# Patient Record
Sex: Female | Born: 1985 | Race: White | Hispanic: No | Marital: Single | Smoking: Never smoker
Health system: Southern US, Community
[De-identification: ages and names within clinical notes are randomized; demographics above are authoritative.]

## PROBLEM LIST (undated history)

## (undated) DIAGNOSIS — N390 Urinary tract infection, site not specified: Secondary | ICD-10-CM

## (undated) DIAGNOSIS — K635 Polyp of colon: Secondary | ICD-10-CM

## (undated) DIAGNOSIS — R112 Nausea with vomiting, unspecified: Secondary | ICD-10-CM

## (undated) DIAGNOSIS — R87619 Unspecified abnormal cytological findings in specimens from cervix uteri: Secondary | ICD-10-CM

## (undated) DIAGNOSIS — R519 Headache, unspecified: Secondary | ICD-10-CM

## (undated) DIAGNOSIS — R350 Frequency of micturition: Secondary | ICD-10-CM

## (undated) DIAGNOSIS — F17200 Nicotine dependence, unspecified, uncomplicated: Secondary | ICD-10-CM

## (undated) DIAGNOSIS — J302 Other seasonal allergic rhinitis: Secondary | ICD-10-CM

## (undated) DIAGNOSIS — IMO0002 Reserved for concepts with insufficient information to code with codable children: Secondary | ICD-10-CM

## (undated) DIAGNOSIS — N898 Other specified noninflammatory disorders of vagina: Principal | ICD-10-CM

## (undated) DIAGNOSIS — Z9889 Other specified postprocedural states: Secondary | ICD-10-CM

## (undated) DIAGNOSIS — F419 Anxiety disorder, unspecified: Secondary | ICD-10-CM

## (undated) DIAGNOSIS — R51 Headache: Secondary | ICD-10-CM

## (undated) HISTORY — DX: Urinary tract infection, site not specified: N39.0

## (undated) HISTORY — DX: Reserved for concepts with insufficient information to code with codable children: IMO0002

## (undated) HISTORY — DX: Headache: R51

## (undated) HISTORY — DX: Other specified noninflammatory disorders of vagina: N89.8

## (undated) HISTORY — PX: OTHER SURGICAL HISTORY: SHX169

## (undated) HISTORY — DX: Unspecified abnormal cytological findings in specimens from cervix uteri: R87.619

## (undated) HISTORY — DX: Polyp of colon: K63.5

## (undated) HISTORY — DX: Nicotine dependence, unspecified, uncomplicated: F17.200

## (undated) HISTORY — DX: Headache, unspecified: R51.9

## (undated) HISTORY — DX: Frequency of micturition: R35.0

---

## 2008-10-06 ENCOUNTER — Other Ambulatory Visit: Admission: RE | Admit: 2008-10-06 | Discharge: 2008-10-06 | Payer: Self-pay | Admitting: Obstetrics & Gynecology

## 2008-10-07 ENCOUNTER — Emergency Department: Payer: Self-pay | Admitting: Emergency Medicine

## 2009-10-10 ENCOUNTER — Other Ambulatory Visit: Admission: RE | Admit: 2009-10-10 | Discharge: 2009-10-10 | Payer: Self-pay | Admitting: Obstetrics & Gynecology

## 2010-06-12 ENCOUNTER — Other Ambulatory Visit: Admission: RE | Admit: 2010-06-12 | Discharge: 2010-06-12 | Payer: Self-pay | Admitting: Obstetrics & Gynecology

## 2010-11-10 ENCOUNTER — Other Ambulatory Visit
Admission: RE | Admit: 2010-11-10 | Discharge: 2010-11-10 | Payer: Self-pay | Source: Home / Self Care | Admitting: Obstetrics & Gynecology

## 2011-10-21 ENCOUNTER — Other Ambulatory Visit: Payer: Self-pay | Admitting: Obstetrics & Gynecology

## 2012-01-01 ENCOUNTER — Other Ambulatory Visit (HOSPITAL_COMMUNITY)
Admission: RE | Admit: 2012-01-01 | Discharge: 2012-01-01 | Disposition: A | Discharge status: Home / Self Care | Payer: Medicaid Other | Source: Ambulatory Visit | Attending: Obstetrics & Gynecology | Admitting: Obstetrics & Gynecology

## 2012-01-01 DIAGNOSIS — Z01419 Encounter for gynecological examination (general) (routine) without abnormal findings: Secondary | ICD-10-CM | POA: Insufficient documentation

## 2012-05-19 DIAGNOSIS — Z882 Allergy status to sulfonamides status: Secondary | ICD-10-CM | POA: Insufficient documentation

## 2012-05-19 DIAGNOSIS — Z886 Allergy status to analgesic agent status: Secondary | ICD-10-CM | POA: Insufficient documentation

## 2012-05-19 DIAGNOSIS — F411 Generalized anxiety disorder: Secondary | ICD-10-CM | POA: Insufficient documentation

## 2012-05-20 ENCOUNTER — Encounter (HOSPITAL_COMMUNITY): Payer: Self-pay

## 2012-05-20 ENCOUNTER — Emergency Department (HOSPITAL_COMMUNITY)
Admission: EM | Admit: 2012-05-20 | Discharge: 2012-05-20 | Disposition: A | Discharge status: Home / Self Care | Payer: Medicaid Other | Attending: Emergency Medicine | Admitting: Emergency Medicine

## 2012-05-20 DIAGNOSIS — F419 Anxiety disorder, unspecified: Secondary | ICD-10-CM

## 2012-05-20 HISTORY — DX: Other seasonal allergic rhinitis: J30.2

## 2012-05-20 HISTORY — DX: Anxiety disorder, unspecified: F41.9

## 2012-05-20 MED ORDER — ALPRAZOLAM 0.5 MG PO TABS
0.5000 mg | ORAL_TABLET | Freq: Once | ORAL | Status: AC
  Administered 2012-05-20: 0.5 mg via ORAL
  Filled 2012-05-20: qty 1

## 2012-05-20 NOTE — ED Notes (Signed)
I was on nerve medications and I didn't know that I didn't have any medication left. My doctor is out of town per pt. My nerves are tearing my stomach up.

## 2012-05-20 NOTE — ED Provider Notes (Signed)
History     CSN: [id number]  Arrival date & time 05/19/12  2356   First MD Initiated Contact with Patient 05/20/12 0020      Chief Complaint  Patient presents with  . Anxiety    (Consider location/radiation/quality/duration/timing/severity/associated sxs/prior treatment) HPI Julia Stanley is a 26 y.o. female who presents to the Emergency Department complaining of anxiety due to life circumstances. She has been on a "nerve" pill which she has run out of. She does not have the bottle and it is not listed in her current medicines. She states her doctor is on vacation for a week and unavailable for refills.   [facility]  Past Medical History  Diagnosis Date  . Anxiety   . Seasonal allergies     History reviewed. No pertinent past surgical history.  History reviewed. No pertinent family history.  History  Substance Use Topics  . Smoking status: Never Smoker   . Smokeless tobacco: Not on file  . Alcohol Use: No    OB History    Grav Para Term Preterm Abortions TAB SAB Ect Mult Living                  Review of Systems  Constitutional: Negative for fever.       10 Systems reviewed and are negative for acute change except as noted in the HPI.  HENT: Negative for congestion.   Eyes: Negative for discharge and redness.  Respiratory: Negative for cough and shortness of breath.   Cardiovascular: Negative for chest pain.  Gastrointestinal: Negative for vomiting and abdominal pain.  Musculoskeletal: Negative for back pain.  Skin: Negative for rash.  Neurological: Negative for syncope, numbness and headaches.  Psychiatric/Behavioral:       Anxious    Allergies  Aspirin and Sulfa antibiotics  Home Medications   Current Outpatient Rx  Name Route Sig Dispense Refill  . LORATADINE 10 MG PO TABS Oral Take 10 mg by mouth daily.      BP 120/68  Pulse 104  Temp 98.3 F (36.8 C) (Oral)  Resp 18  Ht 5\' 5"  (1.651 m)  Wt 120 lb (54.432 kg)  BMI 19.97  kg/m2  SpO2 98%  LMP 05/14/2012  Physical Exam  Nursing note and vitals reviewed. Constitutional:       Awake, alert, nontoxic appearance.  HENT:  Head: Atraumatic.  Eyes: Right eye exhibits no discharge. Left eye exhibits no discharge.  Neck: Neck supple.  Pulmonary/Chest: Effort normal. She exhibits no tenderness.  Abdominal: Soft. There is no tenderness. There is no rebound.  Musculoskeletal: She exhibits no tenderness.       Baseline ROM, no obvious new focal weakness.  Neurological:       Mental status and motor strength appears baseline for patient and situation.  Skin: No rash noted.  Psychiatric:       Anxious. She denies SI, HI. She is not psychotic.    ED Course  Procedures (including critical care time)     MDM  Patient with a h/o anxiety here with increased anxiety, out of medications. She did not bring her medication bottles. Given xanax while here. She is to follow up with her own doctor.Pt stable in ED with no significant deterioration in condition.The patient appears reasonably screened and/or stabilized for discharge and I doubt any other medical condition or other [facility] requiring further screening, evaluation, or treatment in the ED at this time prior to discharge.  MDM Reviewed: nursing note, vitals and previous  chart           Nicoletta Dress. Colon Branch, MD 05/20/12 [phone]

## 2013-01-19 ENCOUNTER — Other Ambulatory Visit: Payer: Self-pay | Admitting: Obstetrics and Gynecology

## 2013-01-23 ENCOUNTER — Encounter: Payer: Self-pay | Admitting: *Deleted

## 2013-01-23 DIAGNOSIS — R87619 Unspecified abnormal cytological findings in specimens from cervix uteri: Secondary | ICD-10-CM | POA: Insufficient documentation

## 2013-01-23 DIAGNOSIS — IMO0002 Reserved for concepts with insufficient information to code with codable children: Secondary | ICD-10-CM | POA: Insufficient documentation

## 2013-01-26 ENCOUNTER — Encounter: Payer: Self-pay | Admitting: Obstetrics & Gynecology

## 2013-01-26 ENCOUNTER — Ambulatory Visit (INDEPENDENT_AMBULATORY_CARE_PROVIDER_SITE_OTHER): Payer: Medicaid Other | Admitting: Obstetrics & Gynecology

## 2013-01-26 VITALS — BP 90/70 | Ht 65.0 in | Wt 140.0 lb

## 2013-01-26 DIAGNOSIS — Z01419 Encounter for gynecological examination (general) (routine) without abnormal findings: Secondary | ICD-10-CM

## 2013-01-26 DIAGNOSIS — Z0000 Encounter for general adult medical examination without abnormal findings: Secondary | ICD-10-CM

## 2013-01-26 MED ORDER — DESOGESTREL-ETHINYL ESTRADIOL 0.15-0.02/0.01 MG (21/5) PO TABS: 1.0000 | ORAL_TABLET | Freq: Every day | ORAL | Status: DC

## 2013-01-26 NOTE — Patient Instructions (Signed)

## 2013-01-26 NOTE — Progress Notes (Signed)
Patient ID: Julia Stanley, female   DOB: 10/15/1986, 27 y.o.   MRN: [id number] Subjective:     Julia Stanley is a 27 y.o. female here for a routine exam.  Current complaints: irregular bleeding on pills.  Personal health questionnaire reviewed: yes.   Gynecologic History Patient's last menstrual period was 01/13/2013. Contraception: OCP (estrogen/progesterone) Last Pap: 2013. Results were: normal Last mammogram: na. Results were: ns  Obstetric History  G0 OB History   Grav Para Term Preterm Abortions TAB SAB Ect Mult Living                   The following portions of the patient's history were reviewed and updated as appropriate: allergies, current medications, past family history, past medical history, past social history, past surgical history and problem list.  Review of Systems  Review of Systems  Constitutional: Negative for fever, chills, weight loss, malaise/fatigue and diaphoresis.  HENT: Negative for hearing loss, ear pain, nosebleeds, congestion, sore throat, neck pain, tinnitus and ear discharge.   Eyes: Negative for blurred vision, double vision, photophobia, pain, discharge and redness.  Respiratory: Negative for cough, hemoptysis, sputum production, shortness of breath, wheezing and stridor.   Cardiovascular: Negative for chest pain, palpitations, orthopnea, claudication, leg swelling and PND.  Gastrointestinal: negative for abdominal pain. Negative for heartburn, nausea, vomiting, diarrhea, constipation, blood in stool and melena.  Genitourinary: Negative for dysuria, urgency, frequency, hematuria and flank pain.  Musculoskeletal: Negative for myalgias, back pain, joint pain and falls.  Skin: Negative for itching and rash.  Neurological: Negative for dizziness, tingling, tremors, sensory change, speech change, focal weakness, seizures, loss of consciousness, weakness and headaches.  Endo/Heme/Allergies: Negative for environmental allergies and polydipsia. Does not  bruise/bleed easily.  Psychiatric/Behavioral: Negative for depression, suicidal ideas, hallucinations, memory loss and substance abuse. The patient is not nervous/anxious and does not have insomnia.        Objective:    Physical Exam  Vitals reviewed. Constitutional: She is oriented to person, place, and time. She appears well-developed and well-nourished.  HENT:  Head: Normocephalic and atraumatic.  Right Ear: External ear normal.  Left Ear: External ear normal.  Nose: Nose normal.  Mouth/Throat: Oropharynx is clear and moist.  Eyes: Conjunctivae and EOM are normal. Pupils are equal, round, and reactive to light. Right eye exhibits no discharge. Left eye exhibits no discharge. No scleral icterus.  Neck: Normal range of motion. Neck supple. No tracheal deviation present. No thyromegaly present.  Cardiovascular: Normal rate, regular rhythm, normal heart sounds and intact distal pulses.  Exam reveals no gallop and no friction rub.   No murmur heard. Respiratory: Effort normal and breath sounds normal. No respiratory distress. She has no wheezes. She has no rales. She exhibits no tenderness.  GI: Soft. Bowel sounds are normal. She exhibits no distension and no mass. There is no tenderness. There is no rebound and no guarding.  Genitourinary:       Vulva is normal without lesions Vagina is pink moist without discharge Cervix normal in appearance and pap is performed Uterus is normal size shape and contour Adnexa is negative with normal sized ovaries by sonogram  Musculoskeletal: Normal range of motion. She exhibits no edema and no tenderness.  Neurological: She is alert and oriented to person, place, and time. She has normal reflexes. She displays normal reflexes. No cranial nerve deficit. She exhibits normal muscle tone. Coordination normal.  Skin: Skin is warm and dry. No rash noted. No erythema. No pallor.  Psychiatric:  She has a normal mood and affect. Her behavior is normal. Judgment  and thought content normal.       Assessment:    Healthy female exam.    Plan:    Contraception: OCP (estrogen/progesterone).

## 2013-01-28 ENCOUNTER — Other Ambulatory Visit: Payer: Self-pay | Admitting: *Deleted

## 2013-01-28 ENCOUNTER — Encounter: Payer: Self-pay | Admitting: Obstetrics & Gynecology

## 2013-01-29 NOTE — Telephone Encounter (Signed)
Pt requesting Pap results, informed in mychart, Pap normal

## 2013-03-09 ENCOUNTER — Telehealth: Payer: Self-pay | Admitting: Obstetrics & Gynecology

## 2013-03-10 ENCOUNTER — Telehealth: Payer: Self-pay

## 2013-03-10 MED ORDER — FLUCONAZOLE 150 MG PO TABS: 150.0000 mg | ORAL_TABLET | Freq: Once | ORAL | Status: DC

## 2013-03-10 NOTE — Telephone Encounter (Signed)
DIFLUCAN 150 MG PRESCRIBE BY DR Despina Hidden.

## 2013-03-10 NOTE — Telephone Encounter (Signed)
LEFT MESSAGE TO CALL OFFICE

## 2013-03-10 NOTE — Addendum Note (Signed)
Addended by: Lazaro Arms on: 03/10/2013 03:40 PM   Modules accepted: Orders

## 2013-03-10 NOTE — Telephone Encounter (Signed)
HAVING VAGINAL ITCHING X 3 DAYS .USEING MICONAZOLE. NO BETTER.

## 2013-03-17 NOTE — Telephone Encounter (Signed)
PEGGY CALLED A RX IN

## 2013-06-02 ENCOUNTER — Telehealth: Payer: Self-pay | Admitting: Obstetrics & Gynecology

## 2013-06-02 NOTE — Telephone Encounter (Signed)
Pt states started her period on last Tuesday, bleeding real heavy and large clots. Going through 3-4 pads q 2 hours. Call transferred to front staff for soonest appt with Dr. Despina Hidden.

## 2013-06-05 ENCOUNTER — Telehealth: Payer: Self-pay | Admitting: *Deleted

## 2013-06-05 NOTE — Telephone Encounter (Signed)
Spoke with pt. Stopped period yesterday. Now having brown discharge and cramping, like she's going to start again. On Kariva birth control since 12/2012. Advised to give it over the weekend and call Monday if she does starts. Pt voiced understanding. JSY

## 2013-06-08 ENCOUNTER — Ambulatory Visit: Payer: Medicaid Other | Admitting: Obstetrics & Gynecology

## 2013-06-10 ENCOUNTER — Telehealth: Payer: Self-pay | Admitting: Obstetrics & Gynecology

## 2013-06-10 NOTE — Telephone Encounter (Signed)
Pt states continues to have brown discharge and cramping. Pt taking Kariva to regulate periods. Pt spoke with nurse last week and was told to call office back this week if no improvement. Informed pt may need to make an appt, pt states had transportation issues.

## 2013-06-11 MED ORDER — NORETHINDRONE-ETH ESTRADIOL 0.4-35 MG-MCG PO TABS: 1.0000 | ORAL_TABLET | Freq: Every day | ORAL | Status: DC

## 2013-06-16 NOTE — Telephone Encounter (Signed)
Pt notified of change birth control from [place] to [place]. Stop current BCP and should have period then start Ovcon 35. Pt verbalized understanding.

## 2013-08-09 ENCOUNTER — Encounter (HOSPITAL_COMMUNITY): Payer: Self-pay | Admitting: Emergency Medicine

## 2013-08-09 ENCOUNTER — Emergency Department (HOSPITAL_COMMUNITY): Payer: Medicaid Other

## 2013-08-09 ENCOUNTER — Emergency Department (HOSPITAL_COMMUNITY)
Admission: EM | Admit: 2013-08-09 | Discharge: 2013-08-09 | Disposition: A | Discharge status: Home / Self Care | Payer: Medicaid Other | Attending: Emergency Medicine | Admitting: Emergency Medicine

## 2013-08-09 DIAGNOSIS — S93409A Sprain of unspecified ligament of unspecified ankle, initial encounter: Secondary | ICD-10-CM | POA: Insufficient documentation

## 2013-08-09 DIAGNOSIS — Y9289 Other specified places as the place of occurrence of the external cause: Secondary | ICD-10-CM | POA: Insufficient documentation

## 2013-08-09 DIAGNOSIS — Z880 Allergy status to penicillin: Secondary | ICD-10-CM | POA: Insufficient documentation

## 2013-08-09 DIAGNOSIS — Y9302 Activity, running: Secondary | ICD-10-CM | POA: Insufficient documentation

## 2013-08-09 DIAGNOSIS — F411 Generalized anxiety disorder: Secondary | ICD-10-CM | POA: Insufficient documentation

## 2013-08-09 DIAGNOSIS — Z8709 Personal history of other diseases of the respiratory system: Secondary | ICD-10-CM | POA: Insufficient documentation

## 2013-08-09 DIAGNOSIS — Z79899 Other long term (current) drug therapy: Secondary | ICD-10-CM | POA: Insufficient documentation

## 2013-08-09 DIAGNOSIS — W010XXA Fall on same level from slipping, tripping and stumbling without subsequent striking against object, initial encounter: Secondary | ICD-10-CM | POA: Insufficient documentation

## 2013-08-09 MED ORDER — HYDROCODONE-ACETAMINOPHEN 5-325 MG PO TABS
1.0000 | ORAL_TABLET | Freq: Once | ORAL | Status: AC
  Administered 2013-08-09: 1 via ORAL
  Filled 2013-08-09: qty 1

## 2013-08-09 MED ORDER — TRAMADOL HCL 50 MG PO TABS: 50.0000 mg | ORAL_TABLET | Freq: Four times a day (QID) | ORAL | Status: DC | PRN

## 2013-08-09 MED ORDER — HYDROCODONE-ACETAMINOPHEN 5-325 MG PO TABS: 1.0000 | ORAL_TABLET | ORAL | Status: DC | PRN

## 2013-08-09 NOTE — ED Notes (Signed)
Pt reports ran and tripped over a tree stump.  Swelling noted to left ankle.

## 2013-08-09 NOTE — ED Provider Notes (Signed)
CSN: [id number]     Arrival date & time 08/09/13  1645 History   First MD Initiated Contact with Patient 08/09/13 1700     Chief Complaint  Patient presents with  . Ankle Pain   (Consider location/radiation/quality/duration/timing/severity/associated sxs/prior Treatment) HPI Comments: Julia Stanley is a 27 y.o. Female presenting with left ankle pain which occurred attempting to jump over a tree stump yesterday,  Instead tripping and inverting her left ankle.  She has pain which is aching, constant and worse with palpation, movement and weight bearing.  The patient was able to weight bear immediately after the event.  There is radiation of pain into her left upper lateral leg and has developed swelling mid left leg as well.  The patients treatment prior to arrival included tylenol without relief of symptoms.   The history is provided by the patient.    Past Medical History  Diagnosis Date  . Anxiety   . Seasonal allergies   . Abnormal Pap smear    History reviewed. No pertinent past surgical history. No family history on file. History  Substance Use Topics  . Smoking status: Never Smoker   . Smokeless tobacco: Not on file  . Alcohol Use: No   OB History   Grav Para Term Preterm Abortions TAB SAB Ect Mult Living   0              Review of Systems  Musculoskeletal: Positive for arthralgias and joint swelling.  Skin: Positive for color change. Negative for wound.  Neurological: Negative for weakness and numbness.    Allergies  Aspirin; Penicillins; and Sulfa antibiotics  Home Medications   Current Outpatient Rx  Name  Route  Sig  Dispense  Refill  . desogestrel-ethinyl estradiol (KARIVA) 0.15-0.02/0.01 MG (21/5) tablet   Oral   Take 1 tablet by mouth daily.   1 Package   11   . fluconazole (DIFLUCAN) 150 MG tablet   Oral   Take 1 tablet (150 mg total) by mouth once. Take the second tablet 3 days after the first one.   2 tablet   0   . loratadine (CLARITIN) 10  MG tablet   Oral   Take 10 mg by mouth daily.         . norethindrone-ethinyl estradiol (OVCON-35,BALZIVA,BRIELLYN) 0.4-35 MG-MCG tablet   Oral   Take 1 tablet by mouth daily.   1 Package   11   . traMADol (ULTRAM) 50 MG tablet   Oral   Take 1 tablet (50 mg total) by mouth every 6 (six) hours as needed for pain.   20 tablet   0    BP 114/70  Pulse 107  Temp(Src) 97.7 F (36.5 C)  Resp 18  Ht 5\' 6"  (1.676 m)  Wt 163 lb 1 oz (73.965 kg)  BMI 26.33 kg/m2  SpO2 100%  LMP 07/20/2013 Physical Exam  Nursing note and vitals reviewed. Constitutional: She appears well-developed and well-nourished.  HENT:  Head: Normocephalic.  Cardiovascular: Normal rate and intact distal pulses.  Exam reveals no decreased pulses.   Pulses:      Dorsalis pedis pulses are 2+ on the right side, and 2+ on the left side.       Posterior tibial pulses are 2+ on the right side, and 2+ on the left side.  Musculoskeletal: She exhibits edema and tenderness.       Left ankle: She exhibits decreased range of motion and swelling. She exhibits no ecchymosis, no deformity and normal  pulse. Tenderness. Lateral malleolus and proximal fibula tenderness found. No head of 5th metatarsal tenderness found. Achilles tendon normal. Achilles tendon exhibits normal Thompson's test results.       Left lower leg: She exhibits tenderness and swelling.       Legs: Neurological: She is alert. No sensory deficit.  Skin: Skin is warm, dry and intact.    ED Course  Procedures (including critical care time) Labs Review Labs Reviewed - No data to display Imaging Review Dg Tibia/fibula Left  08/09/2013   CLINICAL DATA:  But ankle pain. Fall.  EXAM: LEFT TIBIA AND FIBULA - 2 VIEW  COMPARISON:  None.  FINDINGS: There is no evidence of fracture or other focal bone lesions. Soft tissues are unremarkable.  IMPRESSION: Negative.   Electronically Signed   By: Charlett Nose M.D.   On: 08/09/2013 17:53   Dg Ankle Complete  Left  08/09/2013   CLINICAL DATA:  Injury  EXAM: LEFT ANKLE COMPLETE - 3+ VIEW  COMPARISON:  07/29/2013  FINDINGS: No acute fracture. No dislocation.  IMPRESSION: No acute bony pathology.   Electronically Signed   By: Maryclare Bean M.D.   On: 08/09/2013 17:30    EKG Interpretation   None       MDM   1. Ankle sprain and strain, left, initial encounter    Patients labs and/or radiological studies were viewed and considered during the medical decision making and disposition process. RICE,  [place], crutches (pat has).  Recheck with pcp if not improved over the next 10 days.    Burgess Amor, PA-C 08/09/13 [id number]

## 2013-08-09 NOTE — ED Provider Notes (Signed)
  Medical screening examination/treatment/procedure(s) were performed by non-physician practitioner and as supervising physician I was immediately available for consultation/collaboration.    Gerhard Munch, MD 08/09/13 [phone]

## 2013-08-27 ENCOUNTER — Other Ambulatory Visit: Payer: Self-pay

## 2013-09-09 ENCOUNTER — Telehealth: Payer: Self-pay | Admitting: Adult Health

## 2013-09-09 ENCOUNTER — Other Ambulatory Visit: Payer: Self-pay | Admitting: Obstetrics & Gynecology

## 2013-09-09 NOTE — Telephone Encounter (Signed)
Pt c/o vaginal itching. Offered to transfer call to front staff for an appt. Pt states would call office back when she knew her schedule.

## 2013-10-06 ENCOUNTER — Encounter: Payer: Self-pay | Admitting: Obstetrics & Gynecology

## 2013-10-07 ENCOUNTER — Telehealth: Payer: Self-pay | Admitting: *Deleted

## 2013-10-07 MED ORDER — MEDROXYPROGESTERONE ACETATE 150 MG/ML IM SUSP: 150.0000 mg | INTRAMUSCULAR | Status: DC

## 2013-10-07 NOTE — Telephone Encounter (Signed)
Spoke with pt. She is on birth control pills but sometimes forgets them. Is interested in starting the Depo shot. Call transferred to front desk to schedule an appt to discuss Depo. JSY

## 2013-10-09 ENCOUNTER — Encounter: Payer: Self-pay | Admitting: Obstetrics & Gynecology

## 2013-10-10 ENCOUNTER — Encounter: Payer: Self-pay | Admitting: Obstetrics & Gynecology

## 2013-10-12 ENCOUNTER — Telehealth: Payer: Self-pay | Admitting: Obstetrics & Gynecology

## 2013-10-12 ENCOUNTER — Ambulatory Visit: Payer: Medicaid Other | Admitting: Obstetrics & Gynecology

## 2013-10-12 ENCOUNTER — Encounter: Payer: Self-pay | Admitting: Obstetrics & Gynecology

## 2013-10-12 NOTE — Telephone Encounter (Signed)
Pt states to get depo provera 150 mg on Wednesday  Has not started her periods as of yet. Per Dr. Emelda Fear, pt to come in on Tuesday for Qhcg, continue ot take OCP. Pt states will call back for an lab appt tomorrow if not started period tonight.

## 2013-10-13 ENCOUNTER — Telehealth: Payer: Self-pay | Admitting: Obstetrics & Gynecology

## 2013-10-13 ENCOUNTER — Encounter: Payer: Self-pay | Admitting: Obstetrics & Gynecology

## 2013-10-13 NOTE — Telephone Encounter (Signed)
Spoke with pt. Started period this am. Is on birth control pills. Pt has appt to get Depo shot tomorrow. Pt was wondering if she should take pill tomorrow am. I advised it would be fine to go ahead and take pill. Advised would need to use backup for 5 days after starting Depo, per Dr. Despina Hidden. Pt voiced understanding. JSY

## 2013-10-14 ENCOUNTER — Encounter: Payer: Self-pay | Admitting: Obstetrics & Gynecology

## 2013-10-14 ENCOUNTER — Encounter: Payer: Self-pay | Admitting: Obstetrics and Gynecology

## 2013-10-14 ENCOUNTER — Ambulatory Visit (INDEPENDENT_AMBULATORY_CARE_PROVIDER_SITE_OTHER): Payer: Medicaid Other | Admitting: Obstetrics and Gynecology

## 2013-10-14 VITALS — BP 120/78 | Ht 66.0 in | Wt 157.6 lb

## 2013-10-14 DIAGNOSIS — Z309 Encounter for contraceptive management, unspecified: Secondary | ICD-10-CM

## 2013-10-14 DIAGNOSIS — Z3202 Encounter for pregnancy test, result negative: Secondary | ICD-10-CM

## 2013-10-14 DIAGNOSIS — Z3049 Encounter for surveillance of other contraceptives: Secondary | ICD-10-CM

## 2013-10-14 MED ORDER — MEDROXYPROGESTERONE ACETATE 150 MG/ML IM SUSP
150.0000 mg | Freq: Once | INTRAMUSCULAR | Status: AC
  Administered 2013-10-14: 150 mg via INTRAMUSCULAR

## 2013-10-14 NOTE — Progress Notes (Signed)
Patient ID: Julia Stanley, female   DOB: [birth year], 27 y.o.   MRN: [id number] Pt here today for Depo Provera injection, given in rt deltoid.  Pt to return between 12/30/13 and 01/13/14 for next injection.

## 2013-10-18 ENCOUNTER — Encounter: Payer: Self-pay | Admitting: Obstetrics & Gynecology

## 2013-10-19 ENCOUNTER — Other Ambulatory Visit: Payer: Self-pay | Admitting: *Deleted

## 2013-10-19 ENCOUNTER — Telehealth: Payer: Self-pay | Admitting: Obstetrics & Gynecology

## 2013-10-19 ENCOUNTER — Encounter: Payer: Self-pay | Admitting: Obstetrics & Gynecology

## 2013-10-19 LAB — POCT URINE PREGNANCY: Preg Test, Ur: NEGATIVE

## 2013-10-19 MED ORDER — METRONIDAZOLE 0.75 % VA GEL: VAGINAL | Status: DC

## 2013-10-19 NOTE — Addendum Note (Signed)
Addended by: Gaylyn Rong A on: 10/19/2013 11:47 AM   Modules accepted: Orders

## 2013-10-19 NOTE — Telephone Encounter (Signed)
metrogel for BV

## 2013-10-19 NOTE — Telephone Encounter (Signed)
Pt states that her medicaid will not cover the cream, can you call in the pill instead.

## 2013-10-20 ENCOUNTER — Encounter: Payer: Self-pay | Admitting: Obstetrics & Gynecology

## 2013-10-20 MED ORDER — METRONIDAZOLE 500 MG PO TABS: 500.0000 mg | ORAL_TABLET | Freq: Two times a day (BID) | ORAL | Status: DC

## 2013-10-23 ENCOUNTER — Encounter: Payer: Self-pay | Admitting: Obstetrics & Gynecology

## 2013-10-23 ENCOUNTER — Other Ambulatory Visit: Payer: Self-pay | Admitting: Adult Health

## 2013-10-23 MED ORDER — PROMETHAZINE HCL 25 MG PO TABS: ORAL_TABLET | ORAL | Status: DC

## 2013-10-26 ENCOUNTER — Encounter: Payer: Self-pay | Admitting: Obstetrics & Gynecology

## 2013-10-27 ENCOUNTER — Ambulatory Visit (INDEPENDENT_AMBULATORY_CARE_PROVIDER_SITE_OTHER): Payer: Medicaid Other | Admitting: Adult Health

## 2013-10-27 ENCOUNTER — Encounter: Payer: Self-pay | Admitting: Adult Health

## 2013-10-27 ENCOUNTER — Telehealth: Payer: Self-pay | Admitting: *Deleted

## 2013-10-27 ENCOUNTER — Encounter: Payer: Self-pay | Admitting: Obstetrics & Gynecology

## 2013-10-27 VITALS — BP 112/66 | Ht 65.0 in | Wt 156.0 lb

## 2013-10-27 DIAGNOSIS — F17200 Nicotine dependence, unspecified, uncomplicated: Secondary | ICD-10-CM

## 2013-10-27 DIAGNOSIS — Z3009 Encounter for other general counseling and advice on contraception: Secondary | ICD-10-CM

## 2013-10-27 DIAGNOSIS — Z3049 Encounter for surveillance of other contraceptives: Secondary | ICD-10-CM

## 2013-10-27 DIAGNOSIS — L270 Generalized skin eruption due to drugs and medicaments taken internally: Secondary | ICD-10-CM

## 2013-10-27 HISTORY — DX: Nicotine dependence, unspecified, uncomplicated: F17.200

## 2013-10-27 NOTE — Telephone Encounter (Signed)
Pt seen in office today by JAG

## 2013-10-27 NOTE — Progress Notes (Signed)
Subjective:     Patient ID: Julia Stanley, female   DOB: Feb 02, 1986, 28 y.o.   MRN: [id number]  HPI Julia Stanley is a 28 year old white female single in wanting to discuss depo vs the pill and has rash after taking phenergan.Has had some hot flashes and brown discharge with depo.  Review of Systems See HPI Reviewed past medical,surgical, social and family history. Reviewed medications and allergies.     Objective:   Physical Exam BP 112/66  Ht 5\' 5"  (1.651 m)  Wt 156 lb (70.761 kg)  BMI 25.96 kg/m2  LMP 10/13/2013   discussed has only been on depo since 12/24 to give it a chance and not to take phenergan again, she dips and I encouraged her to try to quit Has what looks like drug rash, use benadryl  Assessment:     Drug rash Contraceptive counseling nicotine addiction    Plan:     Try to decrease dipping frequency and amount so quitting will be easier Stay on depo Use condoms Return 3/20 as scheduled for depo

## 2013-10-27 NOTE — Patient Instructions (Signed)

## 2013-10-28 ENCOUNTER — Encounter: Payer: Self-pay | Admitting: Obstetrics & Gynecology

## 2013-11-02 ENCOUNTER — Encounter: Payer: Self-pay | Admitting: Obstetrics & Gynecology

## 2013-11-04 ENCOUNTER — Encounter: Payer: Self-pay | Admitting: Obstetrics & Gynecology

## 2013-11-14 ENCOUNTER — Encounter: Payer: Self-pay | Admitting: Obstetrics & Gynecology

## 2013-11-18 ENCOUNTER — Encounter: Payer: Self-pay | Admitting: Obstetrics & Gynecology

## 2013-11-18 ENCOUNTER — Telehealth: Payer: Self-pay | Admitting: Adult Health

## 2013-11-18 NOTE — Telephone Encounter (Signed)
Pt states that she is having some spotting. Pt states that she has had some spotting since her first DEPO. Pt was advised that some spotting can be normal and that after a few shots it should stop. Pt also advised if anything changes or worsens  To call us back. Pt verbalized understanding.

## 2013-12-01 ENCOUNTER — Encounter: Payer: Self-pay | Admitting: Obstetrics & Gynecology

## 2013-12-22 ENCOUNTER — Encounter: Payer: Self-pay | Admitting: Obstetrics & Gynecology

## 2013-12-28 ENCOUNTER — Encounter: Payer: Self-pay | Admitting: Obstetrics & Gynecology

## 2014-01-06 ENCOUNTER — Ambulatory Visit (INDEPENDENT_AMBULATORY_CARE_PROVIDER_SITE_OTHER): Payer: Medicaid Other | Admitting: Advanced Practice Midwife

## 2014-01-06 ENCOUNTER — Encounter: Payer: Self-pay | Admitting: Advanced Practice Midwife

## 2014-01-06 VITALS — BP 94/64 | Ht 66.0 in | Wt 157.0 lb

## 2014-01-06 DIAGNOSIS — Z3049 Encounter for surveillance of other contraceptives: Secondary | ICD-10-CM

## 2014-01-06 DIAGNOSIS — Z3202 Encounter for pregnancy test, result negative: Secondary | ICD-10-CM

## 2014-01-06 DIAGNOSIS — Z309 Encounter for contraceptive management, unspecified: Secondary | ICD-10-CM

## 2014-01-06 LAB — POCT URINE PREGNANCY: PREG TEST UR: NEGATIVE

## 2014-01-06 MED ORDER — MEDROXYPROGESTERONE ACETATE 150 MG/ML IM SUSP
150.0000 mg | Freq: Once | INTRAMUSCULAR | Status: AC
  Administered 2014-01-06: 150 mg via INTRAMUSCULAR

## 2014-01-06 NOTE — Progress Notes (Signed)
Patient ID: Julia Stanley, female   DOB: Sep 16, 1986, 28 y.o.   MRN: [id number] Depo provera 150 mg IM given left deltoid, no complications. Resulted negative pregnancy test. Pt to return in 12 weeks for next injection.

## 2014-01-07 ENCOUNTER — Encounter: Payer: Self-pay | Admitting: Obstetrics & Gynecology

## 2014-01-08 ENCOUNTER — Ambulatory Visit: Payer: Medicaid Other

## 2014-01-08 ENCOUNTER — Encounter: Payer: Self-pay | Admitting: Obstetrics & Gynecology

## 2014-01-10 ENCOUNTER — Telehealth: Payer: Self-pay | Admitting: Orthopedic Surgery

## 2014-01-10 NOTE — Telephone Encounter (Signed)
Called hospital operator to speak with a doctor regarding back pain  norco 5 not working  Patient of dr Luna Glasgow   Advised to go to er if emergency and if not call dr Luna Glasgow in am

## 2014-01-11 ENCOUNTER — Encounter: Payer: Self-pay | Admitting: Obstetrics & Gynecology

## 2014-01-11 ENCOUNTER — Telehealth: Payer: Self-pay | Admitting: Adult Health

## 2014-01-11 MED ORDER — MEGESTROL ACETATE 40 MG PO TABS: ORAL_TABLET | ORAL | Status: DC

## 2014-01-11 NOTE — Telephone Encounter (Signed)
rx megace for spotting

## 2014-01-13 ENCOUNTER — Telehealth: Payer: Self-pay | Admitting: *Deleted

## 2014-01-13 ENCOUNTER — Encounter: Payer: Self-pay | Admitting: Obstetrics & Gynecology

## 2014-01-13 NOTE — Telephone Encounter (Signed)
Pt states that her face is broke out with acne. What can she do? Pt was advised of everything that Anderson Malta had already told her earlier today. Pt verbalized understanding.

## 2014-01-14 ENCOUNTER — Encounter: Payer: Self-pay | Admitting: Obstetrics & Gynecology

## 2014-01-14 ENCOUNTER — Other Ambulatory Visit: Payer: Self-pay | Admitting: Adult Health

## 2014-01-14 MED ORDER — FLUCONAZOLE 150 MG PO TABS: 150.0000 mg | ORAL_TABLET | Freq: Once | ORAL | Status: DC

## 2014-01-16 ENCOUNTER — Encounter: Payer: Self-pay | Admitting: Obstetrics & Gynecology

## 2014-01-26 ENCOUNTER — Telehealth: Payer: Self-pay | Admitting: *Deleted

## 2014-01-26 ENCOUNTER — Encounter: Payer: Self-pay | Admitting: Obstetrics & Gynecology

## 2014-01-26 NOTE — Telephone Encounter (Signed)
Pt states was taking Womens One A day Vitamin not know feeling tired. Encouraged pt to try another OTC multivitamin and see if that makes a difference if not to discuss with Dr. Elonda Husky at next appt on 02/08/2014. Pt verbalized understanding.

## 2014-02-04 ENCOUNTER — Ambulatory Visit: Payer: Medicaid Other | Admitting: Obstetrics and Gynecology

## 2014-02-08 ENCOUNTER — Other Ambulatory Visit (HOSPITAL_COMMUNITY)
Admission: RE | Admit: 2014-02-08 | Discharge: 2014-02-08 | Disposition: A | Discharge status: Home / Self Care | Payer: Medicaid Other | Source: Ambulatory Visit | Attending: Obstetrics & Gynecology | Admitting: Obstetrics & Gynecology

## 2014-02-08 ENCOUNTER — Other Ambulatory Visit: Payer: Medicaid Other | Admitting: Obstetrics & Gynecology

## 2014-02-08 ENCOUNTER — Ambulatory Visit (INDEPENDENT_AMBULATORY_CARE_PROVIDER_SITE_OTHER): Payer: Medicaid Other | Admitting: Obstetrics and Gynecology

## 2014-02-08 ENCOUNTER — Encounter: Payer: Self-pay | Admitting: Obstetrics and Gynecology

## 2014-02-08 ENCOUNTER — Other Ambulatory Visit: Payer: Medicaid Other | Admitting: Adult Health

## 2014-02-08 VITALS — BP 110/84 | Ht 64.0 in | Wt 156.2 lb

## 2014-02-08 DIAGNOSIS — Z309 Encounter for contraceptive management, unspecified: Secondary | ICD-10-CM | POA: Insufficient documentation

## 2014-02-08 DIAGNOSIS — Z01419 Encounter for gynecological examination (general) (routine) without abnormal findings: Secondary | ICD-10-CM

## 2014-02-08 DIAGNOSIS — Z0000 Encounter for general adult medical examination without abnormal findings: Secondary | ICD-10-CM

## 2014-02-08 NOTE — Progress Notes (Signed)
This chart was scribed by Ludger Nutting, Medical Scribe, for Dr. Mallory Shirk on 02/08/14 at 2:09 PM. This chart was reviewed by Dr. Mallory Shirk and is accurate.  Assessment:  Annual Gyn Exam  BTB n Depo provera, rx'd Megace for BTB Plan:  1. pap smear done, next pap due 3 years  2. Return annually or prn Depo Provera in 3 months --June 3    Annual mammogram advised Subjective:  Julia Stanley is a 28 y.o. female G0P0 who presents for annual exam. No LMP recorded. Patient has had an injection. The patient has complaints today of occasional vaginal spotting which is described as brown. LNMP was 3 months ago prior to starting Depo injections.   The following portions of the patient's history were reviewed and updated as appropriate: allergies, current medications, past family history, past medical history, past social history, past surgical history and problem list.  Review of Systems Constitutional: negative Gastrointestinal: negative Genitourinary: occasional brown vaginal spotting   Objective:  BP 110/84  Ht 5\' 4"  (1.626 m)  Wt 156 lb 3.2 oz (70.852 kg)  BMI 26.80 kg/m2   BMI: Body mass index is 26.8 kg/(m^2).  General Appearance: Alert, appropriate appearance for age. No acute distress HEENT: Grossly normal Neck / Thyroid:  Cardiovascular: RRR; normal S1, S2, no murmur Lungs: CTA bilaterally Back: No CVAT Breast Exam: No dimpling, nipple retraction or discharge. No masses or nodes., Normal to inspection, Normal breast tissue bilaterally and No masses or nodes.No dimpling, nipple retraction or discharge. Gastrointestinal: Soft, non-tender, no masses or organomegaly Pelvic Exam:  VULVA: normal appearing vulva with no masses, tenderness or lesions,  VAGINA: normal appearing vagina with normal color and discharge, no lesions,  CERVIX: normal appearing cervix without discharge or lesions,  UTERUS: uterus is normal size, shape, consistency and nontender, midplane uterus, thick  abdominal wall,  ADNEXA: normal adnexa in size, nontender and no masses.  Rectovaginal: not indicated Lymphatic Exam: Non-palpable nodes in neck, clavicular, axillary, or inguinal regions  Skin: no rash or abnormalities Neurologic: Normal gait and speech, no tremor  Psychiatric: Alert and oriented, appropriate affect.  Urinalysis:Not done  Mallory Shirk. MD Pgr [phone] 2:09 PM

## 2014-02-10 ENCOUNTER — Encounter: Payer: Self-pay | Admitting: Obstetrics & Gynecology

## 2014-02-17 ENCOUNTER — Encounter: Payer: Self-pay | Admitting: Obstetrics & Gynecology

## 2014-02-22 ENCOUNTER — Encounter: Payer: Self-pay | Admitting: Obstetrics & Gynecology

## 2014-02-23 ENCOUNTER — Other Ambulatory Visit: Payer: Self-pay | Admitting: Adult Health

## 2014-02-23 MED ORDER — VALACYCLOVIR HCL 1 G PO TABS: ORAL_TABLET | ORAL | Status: DC

## 2014-02-24 ENCOUNTER — Encounter: Payer: Self-pay | Admitting: Obstetrics & Gynecology

## 2014-02-26 ENCOUNTER — Encounter: Payer: Self-pay | Admitting: Obstetrics & Gynecology

## 2014-03-02 ENCOUNTER — Encounter: Payer: Self-pay | Admitting: Obstetrics & Gynecology

## 2014-03-09 ENCOUNTER — Encounter: Payer: Self-pay | Admitting: Obstetrics & Gynecology

## 2014-03-10 ENCOUNTER — Ambulatory Visit (INDEPENDENT_AMBULATORY_CARE_PROVIDER_SITE_OTHER): Payer: Medicaid Other | Admitting: Adult Health

## 2014-03-10 ENCOUNTER — Encounter: Payer: Self-pay | Admitting: Adult Health

## 2014-03-10 VITALS — BP 120/70 | Ht 66.0 in | Wt 163.0 lb

## 2014-03-10 DIAGNOSIS — N898 Other specified noninflammatory disorders of vagina: Secondary | ICD-10-CM

## 2014-03-10 HISTORY — DX: Other specified noninflammatory disorders of vagina: N89.8

## 2014-03-10 LAB — POCT WET PREP (WET MOUNT)
Trichomonas Wet Prep HPF POC: NEGATIVE
WBC WET PREP: NEGATIVE

## 2014-03-10 MED ORDER — MEGESTROL ACETATE 40 MG PO TABS: ORAL_TABLET | ORAL | Status: DC

## 2014-03-10 NOTE — Progress Notes (Signed)
Subjective:     Patient ID: Julia Stanley, female   DOB: 30-Sep-1986, 28 y.o.   MRN: [id number]  HPI [name] is a 28 year old white female in complaining of brown discharge on panties.No pain or itching. She is on depo provera.  Review of Systems See HPI Reviewed past medical,surgical, social and family history. Reviewed medications and allergies.     Objective:   Physical Exam BP 120/70  Ht 5\' 6"  (1.676 m)  Wt 163 lb (73.936 kg)  BMI 26.32 kg/m2   Skin warm and dry.Pelvic: external genitalia is normal in appearance, vagina:scant white discharge without odor, cervix:smooth and tiny, uterus: normal size, shape and contour, non tender, no masses felt, adnexa: no masses or tenderness noted. Wet prep: negative  Assessment:     Vaginal discharge    Plan:     Return 6/10 for depo provera injection   Refilled megace if has BTB with depo

## 2014-03-10 NOTE — Patient Instructions (Signed)
Follow up as scheduled for depo

## 2014-03-31 ENCOUNTER — Encounter: Payer: Self-pay | Admitting: Obstetrics and Gynecology

## 2014-03-31 ENCOUNTER — Ambulatory Visit (INDEPENDENT_AMBULATORY_CARE_PROVIDER_SITE_OTHER): Payer: Medicaid Other | Admitting: Obstetrics and Gynecology

## 2014-03-31 DIAGNOSIS — Z3202 Encounter for pregnancy test, result negative: Secondary | ICD-10-CM

## 2014-03-31 DIAGNOSIS — Z309 Encounter for contraceptive management, unspecified: Secondary | ICD-10-CM

## 2014-03-31 DIAGNOSIS — Z3200 Encounter for pregnancy test, result unknown: Secondary | ICD-10-CM

## 2014-03-31 DIAGNOSIS — Z3049 Encounter for surveillance of other contraceptives: Secondary | ICD-10-CM

## 2014-03-31 LAB — POCT URINE PREGNANCY: Preg Test, Ur: NEGATIVE

## 2014-03-31 MED ORDER — MEDROXYPROGESTERONE ACETATE 150 MG/ML IM SUSP
150.0000 mg | Freq: Once | INTRAMUSCULAR | Status: AC
  Administered 2014-03-31: 150 mg via INTRAMUSCULAR

## 2014-04-12 ENCOUNTER — Encounter: Payer: Self-pay | Admitting: Obstetrics & Gynecology

## 2014-04-12 ENCOUNTER — Other Ambulatory Visit: Payer: Self-pay | Admitting: Adult Health

## 2014-04-12 MED ORDER — NITROFURANTOIN MONOHYD MACRO 100 MG PO CAPS: 100.0000 mg | ORAL_CAPSULE | Freq: Two times a day (BID) | ORAL | Status: DC

## 2014-04-13 ENCOUNTER — Encounter: Payer: Self-pay | Admitting: Obstetrics & Gynecology

## 2014-04-14 ENCOUNTER — Encounter: Payer: Self-pay | Admitting: Obstetrics & Gynecology

## 2014-04-15 ENCOUNTER — Encounter: Payer: Self-pay | Admitting: Obstetrics & Gynecology

## 2014-05-03 ENCOUNTER — Other Ambulatory Visit (HOSPITAL_COMMUNITY): Payer: Self-pay | Admitting: Family Medicine

## 2014-05-03 ENCOUNTER — Telehealth: Payer: Self-pay | Admitting: Obstetrics and Gynecology

## 2014-05-03 ENCOUNTER — Encounter: Payer: Self-pay | Admitting: Obstetrics & Gynecology

## 2014-05-03 DIAGNOSIS — R109 Unspecified abdominal pain: Secondary | ICD-10-CM

## 2014-05-03 DIAGNOSIS — Z8719 Personal history of other diseases of the digestive system: Secondary | ICD-10-CM

## 2014-05-03 NOTE — Telephone Encounter (Signed)
Spoke with pt. Pt is on the Depo shot. Pt states she is staying sick on her stomach. Friends are saying she may be pregnant. Has had no sex in 5 months. Pt states she done a urine pregnancy test at home and it was negative. I advised she may have a touch of a stomach virus. Advised to drink plenty of fluids, and eat crackers. As she feels like she can increase food, do that gradually. Pt reports a vaginal discharge. I advised she would need to be seen for that. Pt voiced understanding, but didn't schedule an appt. [place]

## 2014-05-06 ENCOUNTER — Ambulatory Visit (HOSPITAL_COMMUNITY)
Admission: RE | Admit: 2014-05-06 | Discharge: 2014-05-06 | Disposition: A | Discharge status: Home / Self Care | Payer: Medicaid Other | Source: Ambulatory Visit | Attending: Family Medicine | Admitting: Family Medicine

## 2014-05-06 DIAGNOSIS — R109 Unspecified abdominal pain: Secondary | ICD-10-CM | POA: Insufficient documentation

## 2014-05-06 DIAGNOSIS — Z8719 Personal history of other diseases of the digestive system: Secondary | ICD-10-CM

## 2014-05-06 DIAGNOSIS — R110 Nausea: Secondary | ICD-10-CM | POA: Diagnosis not present

## 2014-06-03 ENCOUNTER — Encounter: Payer: Self-pay | Admitting: Obstetrics & Gynecology

## 2014-06-07 IMAGING — CR DG TIBIA/FIBULA 2V*L*
2 series · 2 of 2 positions shown · non-contrast
Comparison: None.

CLINICAL DATA: But ankle pain. Fall.

EXAM:
LEFT TIBIA AND FIBULA - 2 VIEW

[view not recorded (1 of 2)]
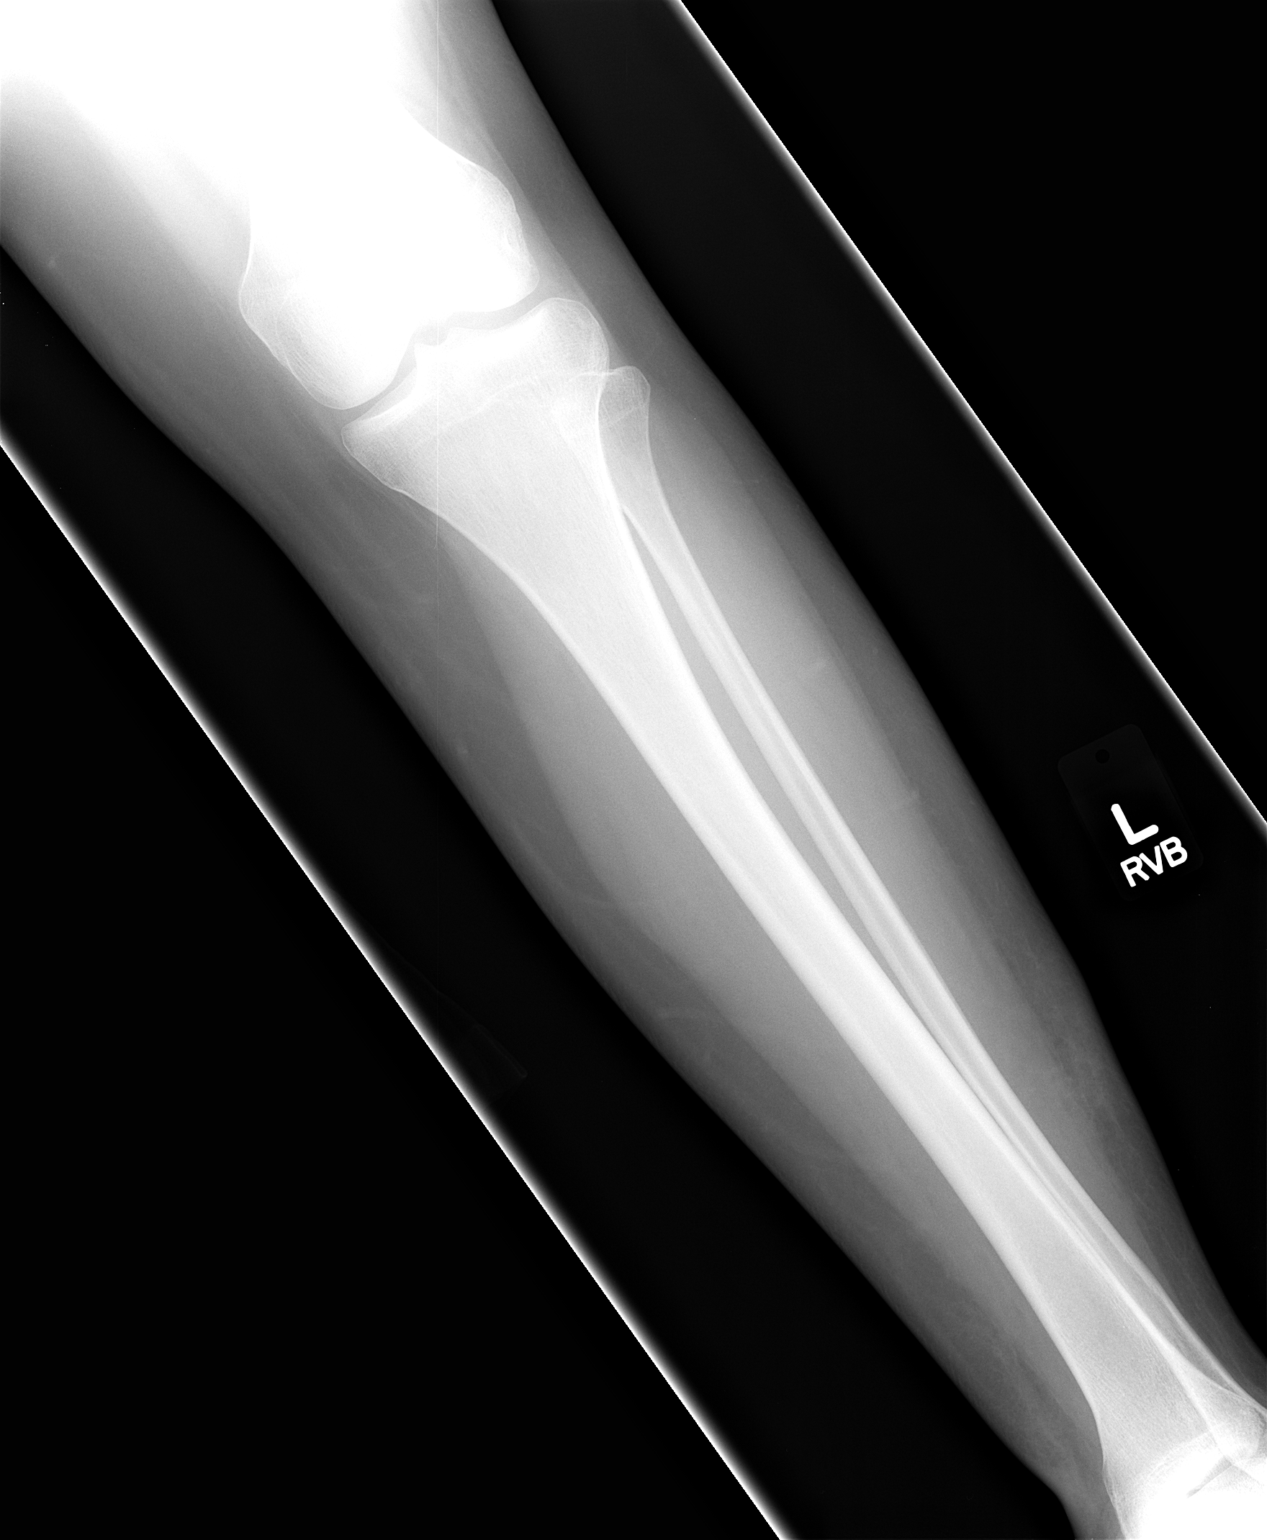

[view not recorded (2 of 2)]
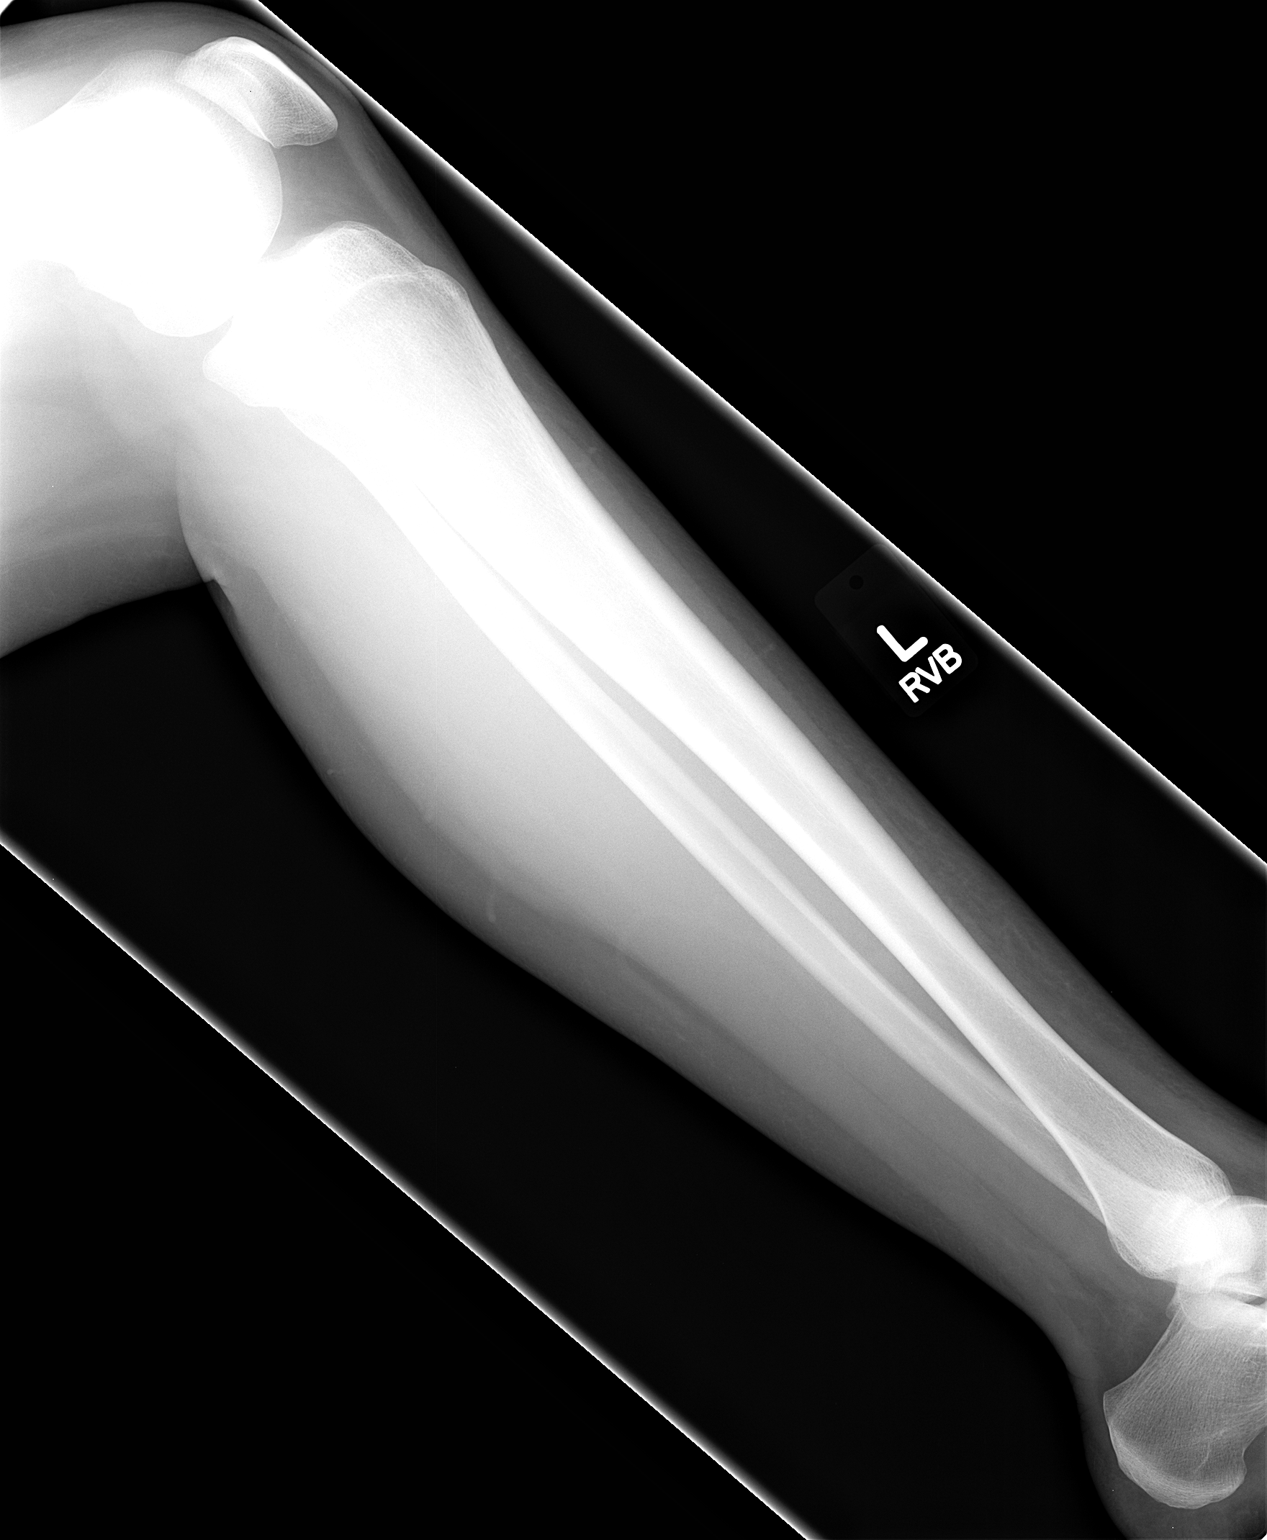

[2 of 2 positions shown; findings below may reference images not displayed]

FINDINGS: There is no evidence of fracture or other focal bone lesions. Soft
tissues are unremarkable.
IMPRESSION: Negative.

## 2014-06-17 ENCOUNTER — Encounter: Payer: Self-pay | Admitting: Obstetrics & Gynecology

## 2014-06-22 ENCOUNTER — Other Ambulatory Visit: Payer: Self-pay | Admitting: *Deleted

## 2014-06-22 MED ORDER — MEDROXYPROGESTERONE ACETATE 150 MG/ML IM SUSP: 150.0000 mg | INTRAMUSCULAR | Status: DC

## 2014-06-23 ENCOUNTER — Ambulatory Visit (INDEPENDENT_AMBULATORY_CARE_PROVIDER_SITE_OTHER): Payer: Medicaid Other | Admitting: Adult Health

## 2014-06-23 ENCOUNTER — Encounter: Payer: Self-pay | Admitting: Adult Health

## 2014-06-23 DIAGNOSIS — Z3042 Encounter for surveillance of injectable contraceptive: Secondary | ICD-10-CM

## 2014-06-23 DIAGNOSIS — Z3202 Encounter for pregnancy test, result negative: Secondary | ICD-10-CM

## 2014-06-23 DIAGNOSIS — Z3049 Encounter for surveillance of other contraceptives: Secondary | ICD-10-CM

## 2014-06-23 LAB — POCT URINE PREGNANCY: PREG TEST UR: NEGATIVE

## 2014-06-23 MED ORDER — MEDROXYPROGESTERONE ACETATE 150 MG/ML IM SUSP
150.0000 mg | Freq: Once | INTRAMUSCULAR | Status: AC
  Administered 2014-06-23: 150 mg via INTRAMUSCULAR

## 2014-06-29 ENCOUNTER — Other Ambulatory Visit: Payer: Self-pay | Admitting: Adult Health

## 2014-06-29 MED ORDER — FLUCONAZOLE 150 MG PO TABS: 150.0000 mg | ORAL_TABLET | Freq: Once | ORAL | Status: DC

## 2014-07-01 ENCOUNTER — Telehealth: Payer: Self-pay | Admitting: Adult Health

## 2014-07-01 ENCOUNTER — Encounter: Payer: Self-pay | Admitting: Obstetrics & Gynecology

## 2014-07-01 MED ORDER — CIPROFLOXACIN HCL 500 MG PO TABS: 500.0000 mg | ORAL_TABLET | Freq: Two times a day (BID) | ORAL | Status: DC

## 2014-07-01 NOTE — Telephone Encounter (Signed)
Pt thinks has UTI has frequency of urination and dribbles,Rx cipro 500 mg 1 bid x 7days

## 2014-07-01 NOTE — Telephone Encounter (Signed)
Left message I called her back. 

## 2014-07-04 ENCOUNTER — Encounter: Payer: Self-pay | Admitting: Obstetrics & Gynecology

## 2014-07-12 ENCOUNTER — Encounter: Payer: Self-pay | Admitting: Obstetrics & Gynecology

## 2014-08-18 ENCOUNTER — Encounter: Payer: Self-pay | Admitting: Obstetrics & Gynecology

## 2014-09-15 ENCOUNTER — Ambulatory Visit (INDEPENDENT_AMBULATORY_CARE_PROVIDER_SITE_OTHER): Payer: Medicaid Other | Admitting: Advanced Practice Midwife

## 2014-09-15 ENCOUNTER — Encounter: Payer: Self-pay | Admitting: Advanced Practice Midwife

## 2014-09-15 DIAGNOSIS — Z3202 Encounter for pregnancy test, result negative: Secondary | ICD-10-CM

## 2014-09-15 DIAGNOSIS — Z3042 Encounter for surveillance of injectable contraceptive: Secondary | ICD-10-CM

## 2014-09-15 LAB — POCT URINE PREGNANCY: Preg Test, Ur: NEGATIVE

## 2014-09-15 MED ORDER — MEDROXYPROGESTERONE ACETATE 150 MG/ML IM SUSP
150.0000 mg | Freq: Once | INTRAMUSCULAR | Status: AC
  Administered 2014-09-15: 150 mg via INTRAMUSCULAR

## 2014-09-17 ENCOUNTER — Encounter: Payer: Self-pay | Admitting: Obstetrics & Gynecology

## 2014-09-22 ENCOUNTER — Encounter: Payer: Self-pay | Admitting: Obstetrics & Gynecology

## 2014-09-23 ENCOUNTER — Telehealth: Payer: Self-pay | Admitting: Adult Health

## 2014-09-23 MED ORDER — MEGESTROL ACETATE 40 MG PO TABS: 40.0000 mg | ORAL_TABLET | Freq: Every day | ORAL | Status: DC

## 2014-09-23 NOTE — Telephone Encounter (Signed)
Will rx megace to stop bleeding with depo

## 2014-10-19 ENCOUNTER — Encounter: Payer: Self-pay | Admitting: Gastroenterology

## 2014-10-22 HISTORY — PX: OTHER SURGICAL HISTORY: SHX169

## 2014-11-18 ENCOUNTER — Ambulatory Visit: Payer: Medicaid Other | Admitting: Gastroenterology

## 2014-11-29 ENCOUNTER — Ambulatory Visit: Payer: Medicaid Other | Admitting: Gastroenterology

## 2014-11-30 ENCOUNTER — Encounter: Payer: Self-pay | Admitting: Gastroenterology

## 2014-11-30 ENCOUNTER — Ambulatory Visit (INDEPENDENT_AMBULATORY_CARE_PROVIDER_SITE_OTHER): Payer: Medicaid Other | Admitting: Gastroenterology

## 2014-11-30 VITALS — BP 112/67 | HR 103 | Temp 98.2°F | Ht 65.0 in | Wt 154.8 lb

## 2014-11-30 DIAGNOSIS — K589 Irritable bowel syndrome without diarrhea: Secondary | ICD-10-CM

## 2014-11-30 MED ORDER — DICYCLOMINE HCL 10 MG PO CAPS: 10.0000 mg | ORAL_CAPSULE | Freq: Three times a day (TID) | ORAL | Status: DC

## 2014-11-30 NOTE — Progress Notes (Signed)
Referring Provider: Renee Rival, NP Primary Care Physician:  Renee Rival, NP Primary Gastroenterologist:  Dr. Oneida Alar   Chief Complaint  Patient presents with  . Abdominal Pain  . Diarrhea  . Constipation    HPI:   Julia Stanley is a 29 y.o. female presenting today at the request of Renee Rival, NP secondary to abdominal pain and diarrhea.   Growls in stomach, pain in stomach, sharp pain runs through stomach. Symptoms present for about 3-4 months. Lower abdominal discomfort. One week will be bound up and the next week will have diarrhea. When having loose stools will go 2-3 times per day, sometimes more, sometimes all day long. No hematochezia. No melena. Prior to this would have a BM daily. No changes in medications recently. No dietary changes. No weight loss. Somewhat lack of appetite. Postprandial loose stool after oral intake. Cramping after eating. No indigestion. Vomiting after eating tacos one night, vomiting with diarrhea. Loves eating at [employer] at least 3 times a week.   Past Medical History  Diagnosis Date  . Anxiety   . Seasonal allergies   . Abnormal Pap smear   . Nicotine addiction 10/27/2013    Dips since age 64  . Vaginal discharge 03/10/2014    Past Surgical History  Procedure Laterality Date  . None      Current Outpatient Prescriptions  Medication Sig Dispense Refill  . clonazePAM (KLONOPIN) 1 MG tablet Take 1 mg by mouth daily.     . medroxyPROGESTERone (DEPO-PROVERA) 150 MG/ML injection Inject 1 mL (150 mg total) into the muscle every 3 (three) months. 1 mL 3  . valACYclovir (VALTREX) 1000 MG tablet Take 2 for cold sores as needed 30 tablet 1  . zolpidem (AMBIEN) 10 MG tablet Take 10 mg by mouth at bedtime as needed for sleep.    Marland Kitchen dicyclomine (BENTYL) 10 MG capsule Take 1 capsule (10 mg total) by mouth 4 (four) times daily -  before meals and at bedtime. For abdominal cramping and loose stools. 120 capsule 3   No current  facility-administered medications for this visit.    Allergies as of 11/30/2014 - Review Complete 11/30/2014  Allergen Reaction Noted  . Aspirin  05/20/2012  . Penicillins  01/23/2013  . Sulfa antibiotics  05/20/2012  . Phenergan [promethazine hcl] Rash 10/27/2013    Family History  Problem Relation Age of Onset  . Cancer Father   . Hypertension Maternal Grandfather   . Hyperlipidemia Maternal Grandfather   . Hypertension Paternal Grandmother   . Cancer Paternal Grandfather   . Colon cancer Neg Hx     History   Social History  . Marital Status: Single    Spouse Name: N/A  . Number of Children: N/A  . Years of Education: N/A   Occupational History  . disability    Social History Main Topics  . Smoking status: Never Smoker   . Smokeless tobacco: Current User    Types: Snuff     Comment: "Been dipping since I was 7"  . Alcohol Use: No     Comment: occ.  . Drug Use: No  . Sexual Activity: Yes    Birth Control/ Protection: Injection   Other Topics Concern  . Not on file   Social History Narrative    Review of Systems: As mentioned in HPI.   Physical Exam: BP 112/67 mmHg  Pulse 103  Temp(Src) 98.2 F (36.8 C) (Oral)  Ht 5\' 5"  (1.651  m)  Wt 154 lb 12.8 oz (70.217 kg)  BMI 25.76 kg/m2 General:   Alert and oriented. Well-developed, well-nourished, pleasant and cooperative. Head:  Normocephalic and atraumatic. Eyes:  Conjunctiva pink, sclera clear, no icterus.   Conjunctiva pink. Ears:  Normal auditory acuity. Nose:  No deformity, discharge,  or lesions. Mouth:  No deformity or lesions, mucosa pink and moist.  Neck:  Supple, without mass or thyromegaly. Lungs:  Clear to auscultation bilaterally, without wheezing, rales, or rhonchi.  Heart:  S1, S2 present without murmurs noted.  Abdomen:  +BS, soft, non-tender and non-distended. Without mass or HSM. No rebound or guarding. No hernias noted. Rectal:  Deferred  Msk:  Symmetrical without gross deformities.  Normal posture. Pulses:  Normal pulses noted. Extremities:  Without clubbing or edema. Neurologic:  Alert and  oriented x4;  grossly normal neurologically. Skin:  Intact, warm and dry without significant lesions or rashes Psych:  Alert and cooperative. Normal mood and affect.

## 2014-11-30 NOTE — Patient Instructions (Addendum)
For the days that you have constipation, you may take Miralax one capful each evening.   For the days you have diarrhea and abdominal cramping, take Bentyl 1 capsule with your meals up to 4 times that day. Monitor for constipation.  Start taking a probiotic such as Electronics engineer, Restora, Digestive Advantage, Philip's Colon Health, [employer]'s brand.   We will see you back in 4-6 weeks.   Diet and Irritable Bowel Syndrome  No cure has been found for irritable bowel syndrome (IBS). Many options are available to treat the symptoms. Your caregiver will give you the best treatments available for your symptoms. He or she will also encourage you to manage stress and to make changes to your diet. You need to work with your caregiver and Registered Dietician to find the best combination of medicine, diet, counseling, and support to control your symptoms. The following are some diet suggestions. FOODS THAT MAKE IBS WORSE  Fatty foods, such as [place] fries.  Milk products, such as cheese or ice cream.  Chocolate.  Alcohol.  Caffeine (found in coffee and some sodas).  Carbonated drinks, such as soda. If certain foods cause symptoms, you should eat less of them or stop eating them. FOOD JOURNAL   Keep a journal of the foods that seem to cause distress. Write down:  What you are eating during the day and when.  What problems you are having after eating.  When the symptoms occur in relation to your meals.  What foods always make you feel badly.  Take your notes with you to your caregiver to see if you should stop eating certain foods. FOODS THAT MAKE IBS BETTER Fiber reduces IBS symptoms, especially constipation, because it makes stools soft, bulky, and easier to pass. Fiber is found in bran, bread, cereal, beans, fruit, and vegetables. Examples of foods with fiber include:  Apples.  Peaches.  Pears.  Berries.  Figs.  Broccoli, raw.  Cabbage.  Carrots.  Raw peas.  Kidney  beans.  Lima beans.  Whole-grain bread.  Whole-grain cereal. Add foods with fiber to your diet a little at a time. This will let your body get used to them. Too much fiber at once might cause gas and swelling of your abdomen. This can trigger symptoms in a person with IBS. Caregivers usually recommend a diet with enough fiber to produce soft, painless bowel movements. High fiber diets may cause gas and bloating. However, these symptoms often go away within a few weeks, as your body adjusts. In many cases, dietary fiber may lessen IBS symptoms, particularly constipation. However, it may not help pain or diarrhea. High fiber diets keep the colon mildly enlarged (distended) with the added fiber. This may help prevent spasms in the colon. Some forms of fiber also keep water in the stool, thereby preventing hard stools that are difficult to pass.  Besides telling you to eat more foods with fiber, your caregiver may also tell you to get more fiber by taking a fiber pill or drinking water mixed with a special high fiber powder. An example of this is a natural fiber laxative containing psyllium seed.  TIPS  Large meals can cause cramping and diarrhea in people with IBS. If this happens to you, try eating 4 or 5 small meals a day, or try eating less at each of your usual 3 meals. It may also help if your meals are low in fat and high in carbohydrates. Examples of carbohydrates are pasta, rice, whole-grain breads and cereals, fruits,  and vegetables.  If dairy products cause your symptoms to flare up, you can try eating less of those foods. You might be able to handle yogurt better than other dairy products, because it contains bacteria that helps with digestion. Dairy products are an important source of calcium and other nutrients. If you need to avoid dairy products, be sure to talk with a Registered Dietitian about getting these nutrients through other food sources.  Drink enough water and fluids to keep  your urine clear or pale yellow. This is important, especially if you have diarrhea. FOR MORE INFORMATION  International Foundation for Functional Gastrointestinal Disorders: www.iffgd.org  National Digestive Diseases Information Clearinghouse: digestive.AmenCredit.is Document Released: 12/29/2003 Document Revised: 12/31/2011 Document Reviewed: 01/08/2014 [facility] Patient Information 2015 [place], [place]. This information is not intended to replace advice given to you by your health care provider. Make sure you discuss any questions you have with your health care provider.

## 2014-12-01 NOTE — Assessment & Plan Note (Signed)
29 year old female with alternating constipation and diarrhea, likely dietary and behavior related. No concerning signs such as rectal bleeding, weight loss. Will trial Bentyl on days of loose stool and Miralax for constipation. Add probiotic. IBS diet sheet provided. Keep food journal. Proceed with colonoscopy if no improvement OR if evidence of rectal bleeding. Return in 4-6 weeks.

## 2014-12-06 ENCOUNTER — Encounter: Payer: Self-pay | Admitting: Obstetrics & Gynecology

## 2014-12-07 ENCOUNTER — Ambulatory Visit (INDEPENDENT_AMBULATORY_CARE_PROVIDER_SITE_OTHER): Payer: Medicaid Other | Admitting: *Deleted

## 2014-12-07 ENCOUNTER — Encounter: Payer: Self-pay | Admitting: *Deleted

## 2014-12-07 DIAGNOSIS — Z3202 Encounter for pregnancy test, result negative: Secondary | ICD-10-CM

## 2014-12-07 DIAGNOSIS — Z3042 Encounter for surveillance of injectable contraceptive: Secondary | ICD-10-CM

## 2014-12-07 LAB — POCT URINE PREGNANCY: Preg Test, Ur: NEGATIVE

## 2014-12-07 MED ORDER — MEDROXYPROGESTERONE ACETATE 150 MG/ML IM SUSP
150.0000 mg | Freq: Once | INTRAMUSCULAR | Status: AC
  Administered 2014-12-07: 150 mg via INTRAMUSCULAR

## 2014-12-07 NOTE — Progress Notes (Signed)
Pt here for Depo shot. Reports no problems at this time. Return in 12 weeks for next shot. [place]

## 2014-12-08 ENCOUNTER — Telehealth: Payer: Self-pay | Admitting: Gastroenterology

## 2014-12-08 ENCOUNTER — Ambulatory Visit: Payer: Medicaid Other

## 2014-12-08 MED ORDER — PANTOPRAZOLE SODIUM 40 MG PO TBEC: 40.0000 mg | DELAYED_RELEASE_TABLET | Freq: Every day | ORAL | Status: DC

## 2014-12-08 NOTE — Telephone Encounter (Signed)
Pt is aware.  

## 2014-12-08 NOTE — Telephone Encounter (Signed)
PATIENT CALLED ABOUT PRESCRIPTION FOR REFLUX MEDICATION  PLEASE CALL

## 2014-12-08 NOTE — Telephone Encounter (Signed)
Pt was seen recently and said if her acid reflux comes back for her to call us and we would send something to her pharmacy ([place]). Please advise. [phone]

## 2014-12-08 NOTE — Telephone Encounter (Signed)
I spoke to pt. She said she was not having reflux when she was here on 11/30/2014. Now she is having a lot of burping and gas and reflux and would like something for it. Please advise!

## 2014-12-08 NOTE — Telephone Encounter (Signed)
I sent in Protonix.  

## 2014-12-08 NOTE — Telephone Encounter (Signed)
See separate note today.

## 2014-12-11 NOTE — Progress Notes (Signed)
CC'ED TO PCP 

## 2014-12-15 ENCOUNTER — Telehealth: Payer: Self-pay | Admitting: General Practice

## 2014-12-15 NOTE — Telephone Encounter (Signed)
Patient was prescribed Pantoprazole 40 mg once a day.  She has noticed that since taking this medication when she burps, it feels like "she is burping up hot water.  Routing to [place].

## 2014-12-15 NOTE — Telephone Encounter (Signed)
Routing to [employer]. This is a [facility] pt.

## 2014-12-16 NOTE — Telephone Encounter (Signed)
Called and LMOM for a return call.  

## 2014-12-17 ENCOUNTER — Telehealth: Payer: Self-pay | Admitting: Gastroenterology

## 2014-12-17 NOTE — Telephone Encounter (Signed)
Called. Many rings and no answer.  

## 2014-12-17 NOTE — Telephone Encounter (Signed)
PATIENT RETURNING CALL, PLEASE  CALL BACK  °

## 2014-12-17 NOTE — Telephone Encounter (Signed)
See note of 12/16/2014.

## 2014-12-20 ENCOUNTER — Telehealth: Payer: Self-pay

## 2014-12-20 DIAGNOSIS — K589 Irritable bowel syndrome without diarrhea: Secondary | ICD-10-CM

## 2014-12-20 NOTE — Telephone Encounter (Signed)
Pt is still having the diarrhea. She is taking the medication four times a day like she was told to but it is not helping. She can eat crackers and water and is in the bathroom in 10 minutes. Please advise

## 2014-12-20 NOTE — Telephone Encounter (Signed)
Pt is having very loose stools when she eats. Taking the Bentyl qid. She does not have transportation to get the Viberzi and would like a prescription sent to the pharmacy for it.

## 2014-12-20 NOTE — Telephone Encounter (Signed)
Is she mainly having diarrhea? If so, let's start Viberzi 100 mg po BID with meals. Pick up samples. Keep appt for 3/15.

## 2014-12-20 NOTE — Telephone Encounter (Signed)
See phone note of 12/20/2014.

## 2014-12-21 ENCOUNTER — Telehealth: Payer: Self-pay | Admitting: Gastroenterology

## 2014-12-21 MED ORDER — ELUXADOLINE 100 MG PO TABS: 1.0000 | ORAL_TABLET | Freq: Two times a day (BID) | ORAL | Status: DC

## 2014-12-21 NOTE — Telephone Encounter (Signed)
Does she have Medicaid? Or is she self pay? If self pay, it will definitely be an outrageous cost.

## 2014-12-21 NOTE — Telephone Encounter (Signed)
I spoke with Julia Stanley at [facility]), she said viberzi was not on the preferred list and that a PA would need to be done. PA has been done and sent to [place] tracks.

## 2014-12-21 NOTE — Telephone Encounter (Signed)
Pt has called multiple times asking about a prescription being called in for her diarrhea. I told her that the nurse and provider were aware and they will get to it as soon as they can. I told her to call her pharmacy around lunch time to check on it and I would send another note to the provider. Pt agreed

## 2014-12-21 NOTE — Telephone Encounter (Signed)
This should be covered. I just spoke with Julia Stanley about this. May need to do a prior authorization. We need to contact the pharmacy and see what is going on.

## 2014-12-21 NOTE — Telephone Encounter (Signed)
Completed.

## 2014-12-21 NOTE — Telephone Encounter (Signed)
Rx has been faxed and [facility] for pt that it has been sent.

## 2014-12-21 NOTE — Telephone Encounter (Signed)
Pt is aware that Almyra Free will do the PA and she would like to know what it has been approved.

## 2014-12-21 NOTE — Telephone Encounter (Signed)
Per the patient she was told by the pharmacy that Medicaid will not pay for the Rx that was sent in.  It would be at least a $1000 out of pocket for her and she can't afford that.  Routing to [employer]

## 2014-12-21 NOTE — Telephone Encounter (Signed)
LMOM for pt that the prescription has been faxed to the pharmacy. [name])

## 2014-12-21 NOTE — Telephone Encounter (Signed)
It will need to be faxed to the pharmacy. It will not be e-prescribed. I will have to complete when I get back to the office around 11.

## 2014-12-21 NOTE — Telephone Encounter (Signed)
Pt has called again to see if the Viberzi Rx was sent to the pharmacy. She did not have transportation to pick up samples.

## 2014-12-21 NOTE — Telephone Encounter (Signed)
Julia Stanley just received the PA request.

## 2014-12-22 ENCOUNTER — Other Ambulatory Visit: Payer: Self-pay

## 2014-12-22 ENCOUNTER — Telehealth: Payer: Self-pay | Admitting: Gastroenterology

## 2014-12-22 DIAGNOSIS — R197 Diarrhea, unspecified: Secondary | ICD-10-CM

## 2014-12-22 NOTE — Telephone Encounter (Signed)
Pt said that she was talking with DS and was suppose to be calling her back. I told her DS was discharging a patient and I would send her another message that she has called. Please call her back at [phone]

## 2014-12-22 NOTE — Telephone Encounter (Signed)
Noted  

## 2014-12-22 NOTE — Telephone Encounter (Signed)
Pt is aware and I am mailing the stool bottles/orders to her since she does not have transportation to pick up.

## 2014-12-22 NOTE — Telephone Encounter (Signed)
Pt said she has only seen blood on tissue once. That was 3 days ago when she had a hemorrhoid out and she put some cream on it and it is doing better now.   She is aware the stool containers and orders are in the mail.

## 2014-12-22 NOTE — Telephone Encounter (Signed)
PA for viberzi was denied. They will be sending a letter in the mail with an explanation. The [facility] medicaid website does not give this information.

## 2014-12-22 NOTE — Telephone Encounter (Signed)
We will make sure she does not have any positive stool studies, then we will need to set up for a colonoscopy with Dr. Oneida Alar due to persistent diarrhea despite supportive measures.

## 2014-12-22 NOTE — Telephone Encounter (Signed)
Vicente Males, which stool studies?

## 2014-12-22 NOTE — Telephone Encounter (Signed)
LMOM to call and let me know if she is having any blood in her stool.

## 2014-12-22 NOTE — Telephone Encounter (Signed)
Cdiff PCR, stool culture, Giardia.

## 2014-12-22 NOTE — Telephone Encounter (Signed)
Spoke to pt See previous note  

## 2014-12-22 NOTE — Telephone Encounter (Signed)
Ok. That's unfortunate. Will they be mailing or faxing to [place]? We need to get stool studies on her.

## 2014-12-22 NOTE — Telephone Encounter (Signed)
They usually mail it to [place].  Routing to [place] for the stool studies.

## 2014-12-27 ENCOUNTER — Other Ambulatory Visit: Payer: Self-pay | Admitting: Gastroenterology

## 2014-12-28 ENCOUNTER — Encounter: Payer: Self-pay | Admitting: Obstetrics & Gynecology

## 2014-12-28 LAB — CLOSTRIDIUM DIFFICILE BY PCR: CDIFFPCR: NOT DETECTED

## 2014-12-28 LAB — GIARDIA ANTIGEN: GIARDIA SCREEN (EIA): NEGATIVE

## 2014-12-29 NOTE — Progress Notes (Signed)
Quick Note:  Cdiff, stool culture negative. Proceed with colonoscopy with Dr. Oneida Alar due to low-volume hematochezia, persistent diarrhea despite supportive measures. No real improvement with Bentyl. PA denied for Viberzi. Let's check TTg, IgA and IgA in the interim, but go ahead and set up for TCS with Dr. Oneida Alar. ______

## 2014-12-30 ENCOUNTER — Telehealth: Payer: Self-pay | Admitting: *Deleted

## 2014-12-30 NOTE — Telephone Encounter (Signed)
Pt called today asking if her stool results were available yet. She dropped them off at the lab at 0800 on Monday per patient. Patient also has OV on 3/15. Please call if results are available. [phone]

## 2014-12-30 NOTE — Progress Notes (Signed)
REVIEWED-called [place] [facility] AND SPOKE TO [place] AND DRUG UNDER REVIEW FOR COVERAGE. I COMPLETED [place] [facility] FROM. DRUG APPROVED.

## 2014-12-31 ENCOUNTER — Other Ambulatory Visit: Payer: Self-pay

## 2014-12-31 DIAGNOSIS — R197 Diarrhea, unspecified: Secondary | ICD-10-CM

## 2014-12-31 LAB — STOOL CULTURE

## 2014-12-31 NOTE — Telephone Encounter (Signed)
See result note. LMOM to call.

## 2014-12-31 NOTE — Progress Notes (Signed)
Quick Note:  LMOM that stool was negative but to please call and we close at noon today. ______
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
## 2015-01-04 ENCOUNTER — Encounter: Payer: Self-pay | Admitting: Gastroenterology

## 2015-01-04 ENCOUNTER — Ambulatory Visit (INDEPENDENT_AMBULATORY_CARE_PROVIDER_SITE_OTHER): Payer: Medicaid Other | Admitting: Gastroenterology

## 2015-01-04 VITALS — BP 113/67 | HR 85 | Temp 97.6°F | Ht 64.0 in | Wt 154.6 lb

## 2015-01-04 DIAGNOSIS — K589 Irritable bowel syndrome without diarrhea: Secondary | ICD-10-CM

## 2015-01-04 MED ORDER — HYDROCORTISONE 2.5 % RE CREA: 1.0000 "application " | TOPICAL_CREAM | Freq: Two times a day (BID) | RECTAL | Status: DC

## 2015-01-04 NOTE — Assessment & Plan Note (Signed)
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 Improvement noted with Bentyl; symptoms notably worse with stress. Ultimately needs avoidance of trigger foods ([employer]), continue Bentyl prn, and schedule a colonoscopy in the near future due to low volume hematochezia. She would like to hold on scheduling due to personal family conflicts. We will have her return April 12th at 2:30 to see me. As of note, she was taking Protonix in the evening. Change to first thing in the morning, 30 minutes before breakfast.

## 2015-01-04 NOTE — Progress Notes (Signed)
cc'ed to pcp °

## 2015-01-04 NOTE — Patient Instructions (Addendum)
Continue taking Bentyl with meals and in the evening as needed for loose stools and abdominal cramping.   Take Protonix in the morning on an empty stomach, 30 minutes before breakfast.    We will see you back April 12th!   Please call me if you have any problems in the meantime. I have sent in the cream for your rectum to use twice a day for 7 days at a time if needed for discomfort.

## 2015-01-04 NOTE — Progress Notes (Signed)
Referring Provider: Renee Rival, NP Primary Care Physician:  Renee Rival, NP  Primary GI: Dr. Oneida Alar   Chief Complaint  Patient presents with  . Irritable Bowel Syndrome    HPI:   Julia Stanley is a 29 y.o. female presenting today with a history of abdominal pain and diarrhea. Originally prescribed Bentyl without relief, and Viberzi was attempted to be covered through [place]. There was some difficulty with this, but Dr. Oneida Alar was able to speak with [place] tracks and have drug approved.   Stool studies including Cdiff, Giardia, and stool culture all negative. Celiac serologies pending.   Taking Protonix about 30 minutes prior to dinner. Still belching "hot water". Takes Bentyl with meals and at bedtime. Diarrhea not as bad unless gets very upset about something. Last few days having a BM once a day. No dysphagia. Paper hematochezia, history of hemorrhoids.     Past Medical History  Diagnosis Date  . Anxiety   . Seasonal allergies   . Abnormal Pap smear   . Nicotine addiction 10/27/2013    Dips since age 46  . Vaginal discharge 03/10/2014    Past Surgical History  Procedure Laterality Date  . None      Current Outpatient Prescriptions  Medication Sig Dispense Refill  . clonazePAM (KLONOPIN) 1 MG tablet Take 1 mg by mouth daily.     Marland Kitchen dicyclomine (BENTYL) 10 MG capsule Take 1 capsule (10 mg total) by mouth 4 (four) times daily -  before meals and at bedtime. For abdominal cramping and loose stools. 120 capsule 3  . Eluxadoline (VIBERZI) 100 MG TABS Take 1 tablet by mouth 2 (two) times daily with a meal. 60 tablet 5  . HYDROcodone-acetaminophen (NORCO/VICODIN) 5-325 MG per tablet 1 tablet as needed.   0  . medroxyPROGESTERone (DEPO-PROVERA) 150 MG/ML injection Inject 1 mL (150 mg total) into the muscle every 3 (three) months. 1 mL 3  . pantoprazole (PROTONIX) 40 MG tablet Take 1 tablet (40 mg total) by mouth daily. 90 tablet 3  . valACYclovir (VALTREX) 1000 MG  tablet Take 2 for cold sores as needed 30 tablet 1  . zolpidem (AMBIEN) 10 MG tablet Take 10 mg by mouth at bedtime as needed for sleep.     No current facility-administered medications for this visit.    Allergies as of 01/04/2015 - Review Complete 01/04/2015  Allergen Reaction Noted  . Aspirin  05/20/2012  . Penicillins  01/23/2013  . Sulfa antibiotics  05/20/2012  . Phenergan [promethazine hcl] Rash 10/27/2013    Family History  Problem Relation Age of Onset  . Cancer Father   . Hypertension Maternal Grandfather   . Hyperlipidemia Maternal Grandfather   . Hypertension Paternal Grandmother   . Cancer Paternal Grandfather   . Colon cancer Neg Hx     History   Social History  . Marital Status: Single    Spouse Name: N/A  . Number of Children: N/A  . Years of Education: N/A   Occupational History  . disability    Social History Main Topics  . Smoking status: Never Smoker   . Smokeless tobacco: Current User    Types: Snuff     Comment: "Been dipping since I was 7"  . Alcohol Use: No     Comment: occ.  . Drug Use: No  . Sexual Activity: Yes    Birth Control/ Protection: Injection   Other Topics Concern  . None   Social History Narrative  Review of Systems: As mentioned in HPI.  Physical Exam: BP 113/67 mmHg  Pulse 85  Temp(Src) 97.6 F (36.4 C) (Oral)  Ht 5\' 4"  (1.626 m)  Wt 154 lb 9.6 oz (70.126 kg)  BMI 26.52 kg/m2 General:   Alert and oriented. No distress noted. Pleasant and cooperative.  Head:  Normocephalic and atraumatic. Abdomen:  +BS, soft, non-tender and non-distended. No rebound or guarding. No HSM or masses noted. Msk:  Symmetrical without gross deformities. Normal posture. Extremities:  Without edema. Neurologic:  Alert and  oriented x4;  grossly normal neurologically. Psych:  Alert and cooperative. Normal mood and affect.

## 2015-01-05 LAB — IGA: IgA: 247 mg/dL (ref 69–380)

## 2015-01-05 LAB — TISSUE TRANSGLUTAMINASE, IGA: TISSUE TRANSGLUTAMINASE AB, IGA: 1 U/mL (ref <4)

## 2015-01-19 NOTE — Progress Notes (Signed)
Quick Note:  Negative celiac disease. ______

## 2015-01-20 NOTE — Progress Notes (Signed)
Quick Note:  Pt is aware of results. ______ 

## 2015-01-20 NOTE — Progress Notes (Signed)
Quick Note:  LMOM to call. ______ 

## 2015-01-27 ENCOUNTER — Telehealth: Payer: Self-pay | Admitting: Gastroenterology

## 2015-01-27 NOTE — Telephone Encounter (Signed)
I told pt that the Viberzi was approved. She needs to call the pharmacy and let us know if there are any problems.

## 2015-01-27 NOTE — Telephone Encounter (Signed)
PLEASE CALL PATIENT [phone]   SHE IS HAVING DIARRHEA AND STATED THAT MEDICAID WILL PAY FOR THE MEDICINE THAT [facility] WAS GOING TO PUT HER ON FOR THE PROBLEM  AS LONG AS SHE IS CURRENTLY HAVING IT.  IS UNAWARE IF IT MAY HAVE BEEN SOMETHING SHE ATE OR IF IT IS DUE TO HER CONDITION.

## 2015-02-01 ENCOUNTER — Encounter: Payer: Self-pay | Admitting: Gastroenterology

## 2015-02-01 ENCOUNTER — Ambulatory Visit (INDEPENDENT_AMBULATORY_CARE_PROVIDER_SITE_OTHER): Payer: Medicaid Other | Admitting: Gastroenterology

## 2015-02-01 VITALS — BP 111/69 | HR 92 | Temp 97.8°F | Ht 65.0 in | Wt 153.8 lb

## 2015-02-01 DIAGNOSIS — K219 Gastro-esophageal reflux disease without esophagitis: Secondary | ICD-10-CM | POA: Diagnosis not present

## 2015-02-01 DIAGNOSIS — K589 Irritable bowel syndrome without diarrhea: Secondary | ICD-10-CM

## 2015-02-01 NOTE — Progress Notes (Signed)
Referring Provider: Renee Rival, NP Primary Care Physician:  Renee Rival, NP  Primary GI: Dr. Oneida Alar   Chief Complaint  Patient presents with  . Follow-up    HPI:   Julia Stanley is a 29 y.o. female presenting today with a history of likely IBS-D and GERD. Has noted low volume hematochezia in the past but wants to wait on colonoscopy as family members are staying with her (she has 1 bathroom).   Viberzi 100 mg BID doing well for patient with improved stool consistency. Taking Protonix but "still belching". Has taken Prilosec in the past. No dysphagia. Occasional spells of nausea but rare. Drinks a 2 liter mountain dew with boyfriend daily. Continues to eat fried foods. Limiting [employer].   Past Medical History  Diagnosis Date  . Anxiety   . Seasonal allergies   . Abnormal Pap smear   . Nicotine addiction 10/27/2013    Dips since age 37  . Vaginal discharge 03/10/2014    Past Surgical History  Procedure Laterality Date  . None      Current Outpatient Prescriptions  Medication Sig Dispense Refill  . clonazePAM (KLONOPIN) 1 MG tablet Take 1 mg by mouth daily.     Marland Kitchen dicyclomine (BENTYL) 10 MG capsule Take 1 capsule (10 mg total) by mouth 4 (four) times daily -  before meals and at bedtime. For abdominal cramping and loose stools. 120 capsule 3  . HYDROcodone-acetaminophen (NORCO/VICODIN) 5-325 MG per tablet 1 tablet as needed.   0  . hydrocortisone (ANUSOL-HC) 2.5 % rectal cream Place 1 application rectally 2 (two) times daily. For 7 days 30 g 1  . medroxyPROGESTERone (DEPO-PROVERA) 150 MG/ML injection Inject 1 mL (150 mg total) into the muscle every 3 (three) months. 1 mL 3  . pantoprazole (PROTONIX) 40 MG tablet Take 1 tablet (40 mg total) by mouth daily. 90 tablet 3  . valACYclovir (VALTREX) 1000 MG tablet Take 2 for cold sores as needed 30 tablet 1  . zolpidem (AMBIEN) 10 MG tablet Take 10 mg by mouth at bedtime as needed for sleep.     No current  facility-administered medications for this visit.    Allergies as of 02/01/2015 - Review Complete 02/01/2015  Allergen Reaction Noted  . Aspirin  05/20/2012  . Penicillins  01/23/2013  . Sulfa antibiotics  05/20/2012  . Phenergan [promethazine hcl] Rash 10/27/2013    Family History  Problem Relation Age of Onset  . Cancer Father   . Hypertension Maternal Grandfather   . Hyperlipidemia Maternal Grandfather   . Hypertension Paternal Grandmother   . Cancer Paternal Grandfather   . Colon cancer Neg Hx     History   Social History  . Marital Status: Single    Spouse Name: N/A  . Number of Children: N/A  . Years of Education: N/A   Occupational History  . disability    Social History Main Topics  . Smoking status: Never Smoker   . Smokeless tobacco: Current User    Types: Snuff     Comment: "Been dipping since I was 7"  . Alcohol Use: No     Comment: occ.  . Drug Use: No  . Sexual Activity: Yes    Birth Control/ Protection: Injection   Other Topics Concern  . None   Social History Narrative    Review of Systems: As mentioned in HPI  Physical Exam: BP 111/69 mmHg  Pulse 92  Temp(Src) 97.8 F (36.6 C)  Ht 5'  5" (1.651 m)  Wt 153 lb 12.8 oz (69.763 kg)  BMI 25.59 kg/m2 General:   Alert and oriented. No distress noted. Pleasant and cooperative.  Head:  Normocephalic and atraumatic. Eyes:  Conjuctiva clear without scleral icterus. Abdomen:  +BS, soft, non-tender and non-distended. No rebound or guarding. No HSM or masses noted. Msk:  Symmetrical without gross deformities. Normal posture. Extremities:  Without edema. Neurologic:  Alert and  oriented x4;  grossly normal neurologically. Skin:  Intact without significant lesions or rashes. Psych:  Alert and cooperative. Normal mood and affect.

## 2015-02-01 NOTE — Patient Instructions (Addendum)
Stop Protonix. Let's try Dexilant once each morning. I have given samples. Please let me know if this works, and I will send in a prescription.  Continue Viberzi twice a day with food.   Cut back/stop [facility]. Review the diet handout attached.  We will see you in August!!  Food Choices for Gastroesophageal Reflux Disease When you have gastroesophageal reflux disease (GERD), the foods you eat and your eating habits are very important. Choosing the right foods can help ease the discomfort of GERD. WHAT GENERAL GUIDELINES DO I NEED TO FOLLOW?  Choose fruits, vegetables, whole grains, low-fat dairy products, and low-fat meat, fish, and poultry.  Limit fats such as oils, salad dressings, butter, nuts, and avocado.  Keep a food diary to identify foods that cause symptoms.  Avoid foods that cause reflux. These may be different for different people.  Eat frequent small meals instead of three large meals each day.  Eat your meals slowly, in a relaxed setting.  Limit fried foods.  Cook foods using methods other than frying.  Avoid drinking alcohol.  Avoid drinking large amounts of liquids with your meals.  Avoid bending over or lying down until 2-3 hours after eating. WHAT FOODS ARE NOT RECOMMENDED? The following are some foods and drinks that may worsen your symptoms: Vegetables Tomatoes. Tomato juice. Tomato and spaghetti sauce. Chili peppers. Onion and garlic. Horseradish. Fruits Oranges, grapefruit, and lemon (fruit and juice). Meats High-fat meats, fish, and poultry. This includes hot dogs, ribs, ham, sausage, salami, and bacon. Dairy Whole milk and chocolate milk. Sour cream. Cream. Butter. Ice cream. Cream cheese.  Beverages Coffee and tea, with or without caffeine. Carbonated beverages or energy drinks. Condiments Hot sauce. Barbecue sauce.  Sweets/Desserts Chocolate and cocoa. Donuts. Peppermint and spearmint. Fats and Oils High-fat foods, including [place] fries  and potato chips. Other Vinegar. Strong spices, such as black pepper, white pepper, red pepper, cayenne, curry powder, cloves, ginger, and chili powder. The items listed above may not be a complete list of foods and beverages to avoid. Contact your dietitian for more information. Document Released: 10/08/2005 Document Revised: 10/13/2013 Document Reviewed: 08/12/2013 [facility] Patient Information 2015 [place], [place]. This information is not intended to replace advice given to you by your health care provider. Make sure you discuss any questions you have with your health care provider.

## 2015-02-02 DIAGNOSIS — K219 Gastro-esophageal reflux disease without esophagitis: Secondary | ICD-10-CM | POA: Insufficient documentation

## 2015-02-02 NOTE — Progress Notes (Signed)
CC'ED TO PCP 

## 2015-02-02 NOTE — Assessment & Plan Note (Signed)
Some improvement with Protonix but still with significant belching. Likely dietary and behavior related. Failed Prilosec in past. As she has tried both Protonix and Prilosec, will trial samples of Dexilant. AVOID mountain dew. GERD diet provided. May need to consider EGD at time of colonoscopy if no improvement despite dietary behavior modification and different PPI (Dexilant).

## 2015-02-02 NOTE — Assessment & Plan Note (Signed)
Improvement with Viberzi. Low-volume hematochezia in the past likely benign anorectal source; however, I have recommended a colonoscopy for further evaluation. She continues to decline this until around August, as she is stressed with multiple family members living with her. Will continue Viberzi and have her return in August to hopefully arrange lower GI evaluation.

## 2015-02-09 ENCOUNTER — Telehealth: Payer: Self-pay | Admitting: Gastroenterology

## 2015-02-09 MED ORDER — DEXLANSOPRAZOLE 60 MG PO CPDR: 60.0000 mg | DELAYED_RELEASE_CAPSULE | Freq: Every day | ORAL | Status: DC

## 2015-02-09 NOTE — Telephone Encounter (Signed)
Pt was given samples of Dexilant at [place] with Laban Emperor, NP on 02/01/2015.

## 2015-02-09 NOTE — Telephone Encounter (Signed)
done

## 2015-02-09 NOTE — Telephone Encounter (Signed)
PATIENT CALLED AND STATED THAT THE SAMPLES OF ACID REFLUX MEDICATION THAT WERE GIVEN TO HER ON HER LAST VISIT ARE WORKING AND SHE WOULD LIKE A PRESCRIPTION OF THEM CALLED INTO HER PHARMACY.

## 2015-02-09 NOTE — Addendum Note (Signed)
Addended by: Mahala Menghini on: 02/09/2015 02:40 PM   Modules accepted: Orders, Medications

## 2015-02-11 ENCOUNTER — Other Ambulatory Visit: Payer: Medicaid Other | Admitting: Obstetrics & Gynecology

## 2015-02-11 ENCOUNTER — Telehealth: Payer: Self-pay | Admitting: General Practice

## 2015-02-11 NOTE — Telephone Encounter (Signed)
Agree 

## 2015-02-11 NOTE — Telephone Encounter (Signed)
I spoke to pt on the phone. She is having the symptoms listed by Rosendo Gros, and the coughing gets so bad it makes her stomach hurt worse. I urged her to see her PCP today and she said there was only one provider there and they could not see her. I told her we do not treat sinus infections and she might try an urgent care. IF her symptoms get worse and she cannot see doctor to go to the ED, however that should be last resort.

## 2015-02-11 NOTE — Telephone Encounter (Signed)
Patient called and is complaining of coughing up yellow mucus, chest congestion.  Patient feels like she has a sinus infection and her pcp is out of town this week.  Routing to [employer]

## 2015-02-15 ENCOUNTER — Other Ambulatory Visit: Payer: Medicaid Other | Admitting: Adult Health

## 2015-03-01 ENCOUNTER — Encounter: Payer: Self-pay | Admitting: Adult Health

## 2015-03-01 ENCOUNTER — Ambulatory Visit (INDEPENDENT_AMBULATORY_CARE_PROVIDER_SITE_OTHER): Payer: Medicaid Other | Admitting: Adult Health

## 2015-03-01 ENCOUNTER — Ambulatory Visit: Payer: Medicaid Other

## 2015-03-01 VITALS — BP 100/66 | HR 60 | Ht 65.0 in | Wt 149.0 lb

## 2015-03-01 DIAGNOSIS — Z3202 Encounter for pregnancy test, result negative: Secondary | ICD-10-CM | POA: Diagnosis not present

## 2015-03-01 DIAGNOSIS — R519 Headache, unspecified: Secondary | ICD-10-CM

## 2015-03-01 DIAGNOSIS — Z3042 Encounter for surveillance of injectable contraceptive: Secondary | ICD-10-CM

## 2015-03-01 DIAGNOSIS — Z0000 Encounter for general adult medical examination without abnormal findings: Secondary | ICD-10-CM | POA: Diagnosis not present

## 2015-03-01 DIAGNOSIS — Z01419 Encounter for gynecological examination (general) (routine) without abnormal findings: Secondary | ICD-10-CM

## 2015-03-01 DIAGNOSIS — R51 Headache: Secondary | ICD-10-CM

## 2015-03-01 LAB — POCT URINE PREGNANCY: PREG TEST UR: NEGATIVE

## 2015-03-01 MED ORDER — RIZATRIPTAN BENZOATE 10 MG PO TABS: 10.0000 mg | ORAL_TABLET | ORAL | Status: DC | PRN

## 2015-03-01 MED ORDER — MEDROXYPROGESTERONE ACETATE 150 MG/ML IM SUSP
150.0000 mg | Freq: Once | INTRAMUSCULAR | Status: AC
  Administered 2015-03-01: 150 mg via INTRAMUSCULAR

## 2015-03-01 MED ORDER — MEDROXYPROGESTERONE ACETATE 150 MG/ML IM SUSP: 150.0000 mg | INTRAMUSCULAR | Status: DC

## 2015-03-01 NOTE — Progress Notes (Signed)
Patient ID: Julia Stanley, female   DOB: 03/08/1986, 29 y.o.   MRN: [id number] History of Present Illness:  Julia Stanley is a 29 year old white female in for her depo and well woman gyn exam.She had a normal pap 01/2014.She complains of headaches about 2 x per month.She is happy with depo.  Current Medications, Allergies, Past Medical History, Past Surgical History, Family History and Social History were reviewed in [employer] record.     Review of Systems: Patient denies any  hearing loss, fatigue, blurred vision, shortness of breath, chest pain, abdominal pain, problems with bowel movements, urination, or intercourse. No joint pain or mood swings. See HPI for positives.   Physical Exam:BP 100/66 mmHg  Pulse 60  Ht 5\' 5"  (1.651 m)  Wt 149 lb (67.586 kg)  BMI 24.79 kg/m2UPT negative, she received depo today. General:  Well developed, well nourished, no acute distress Skin:  Warm and dry Neck:  Midline trachea, normal thyroid, good ROM, no lymphadenopathy Lungs; Clear to auscultation bilaterally Breast:  No dominant palpable mass, retraction, or nipple discharge Cardiovascular: Regular rate and rhythm Abdomen:  Soft, non tender, no hepatosplenomegaly Pelvic:  External genitalia is normal in appearance, no lesions.  The vagina is normal in appearance. Urethra has no lesions or masses. The cervix is tiny and smooth.  Uterus is felt to be normal size, shape, and contour.  No adnexal masses or tenderness noted.Bladder is non tender, no masses felt. Extremities/musculoskeletal:  No swelling or varicosities noted, no clubbing or cyanosis Psych:  No mood changes, alert and cooperative,seems happy   Impression: Well woman gyn exam no pap Contraceptive management Headaches     Plan: Rx maxalt 10 mg #10 no refills, take 1 at start of headache can repeat x 1 in 2 hours Refilled depo provera x 3  Pap and physical in 1 year Return in 12 weeks for next depo Review handout  on headaches

## 2015-03-01 NOTE — Patient Instructions (Signed)

## 2015-03-04 IMAGING — US US ABDOMEN LIMITED
1 series · 14 of 25 positions shown · non-contrast
Comparison: None.

CLINICAL DATA: Abdominal pain, nausea for 1 week, history of
gallstones

EXAM:
US ABDOMEN LIMITED - RIGHT UPPER QUADRANT

[Series 1: us abdomen limited · 0.24mm/px · 14 of 48 slices shown]
[im 1/48]
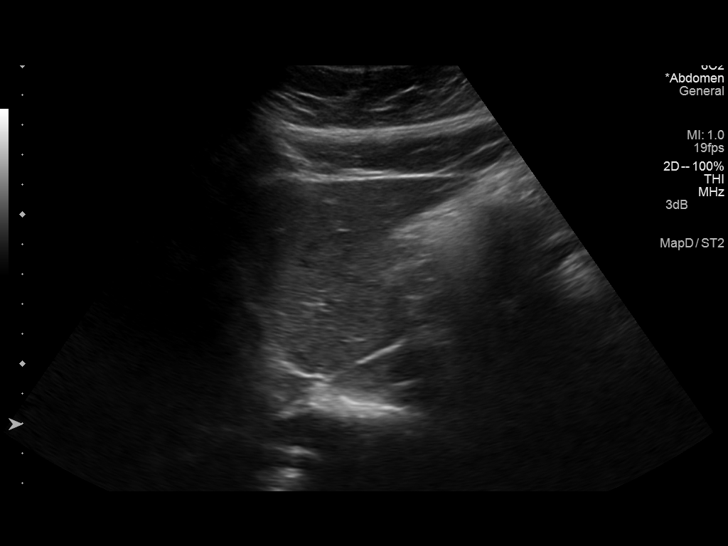
[im 4/48]
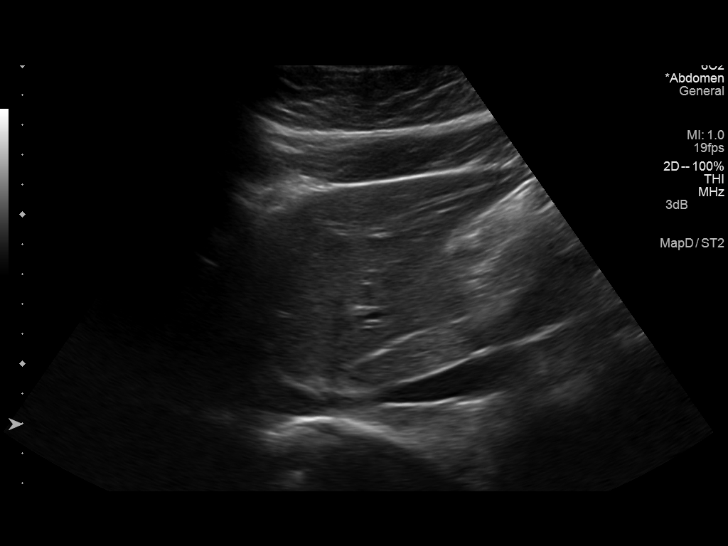
[im 8/48]
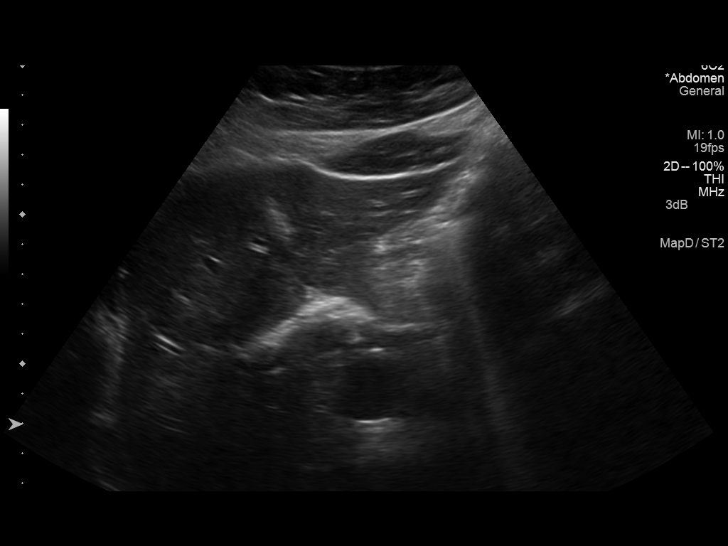
[im 12/48]
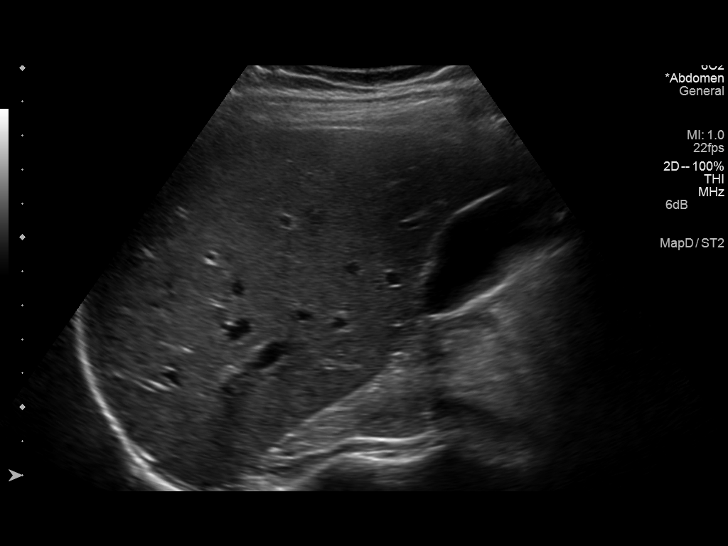
[im 16/48]
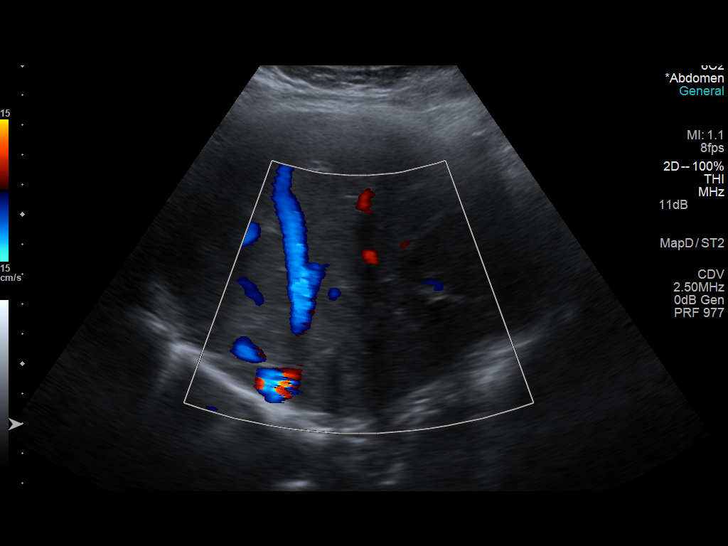
[im 18/48]
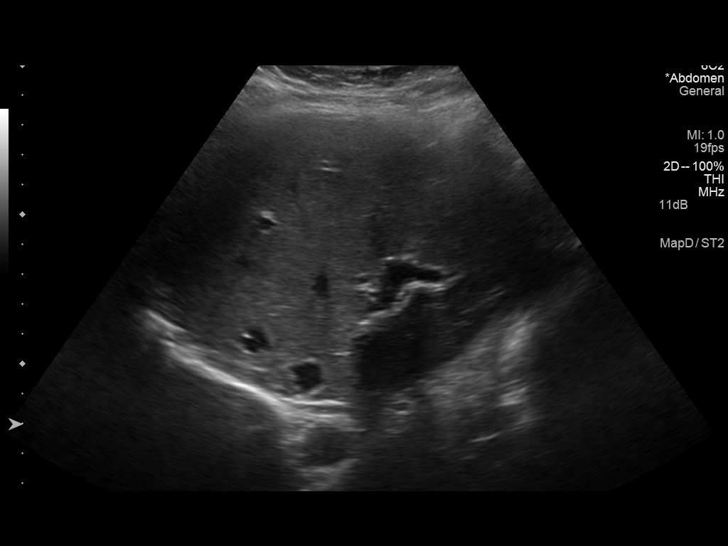
[im 22/48]
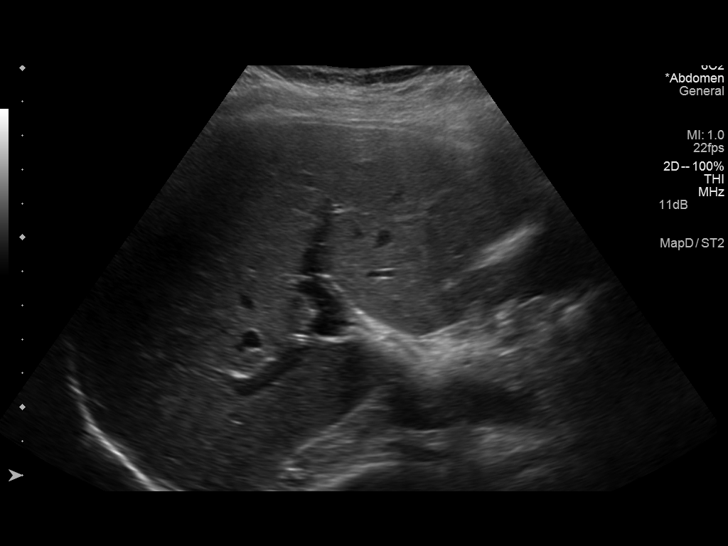
[im 26/48]
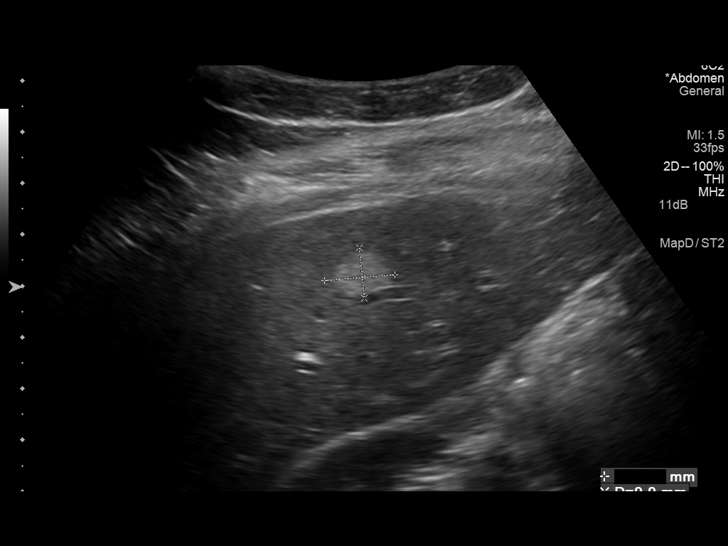
[im 30/48]
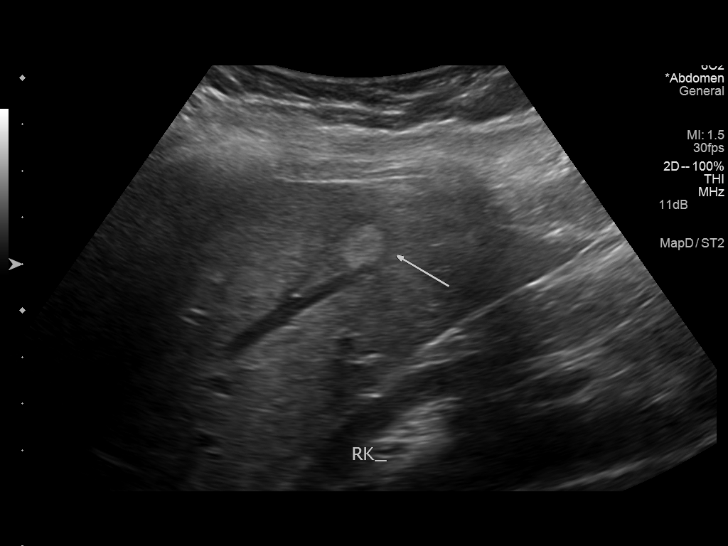
[im 32/48]
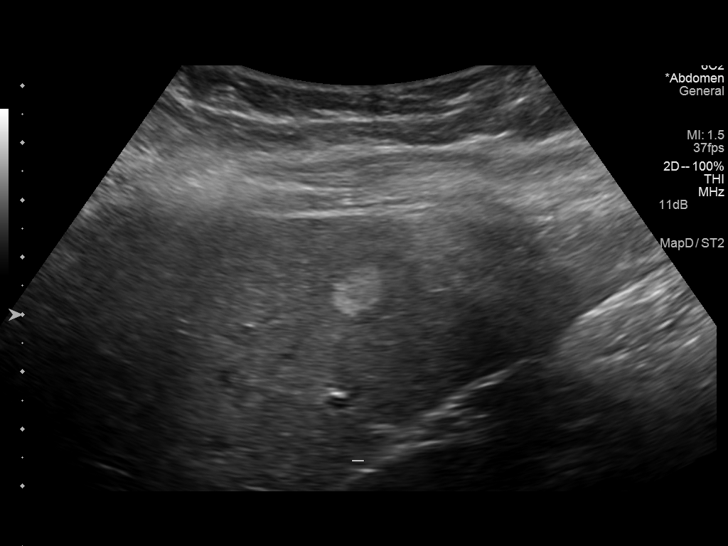
[im 36/48]
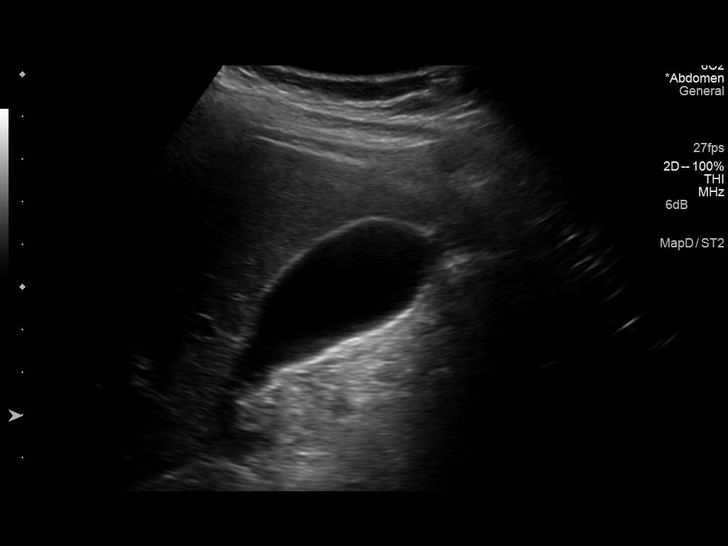
[im 40/48]
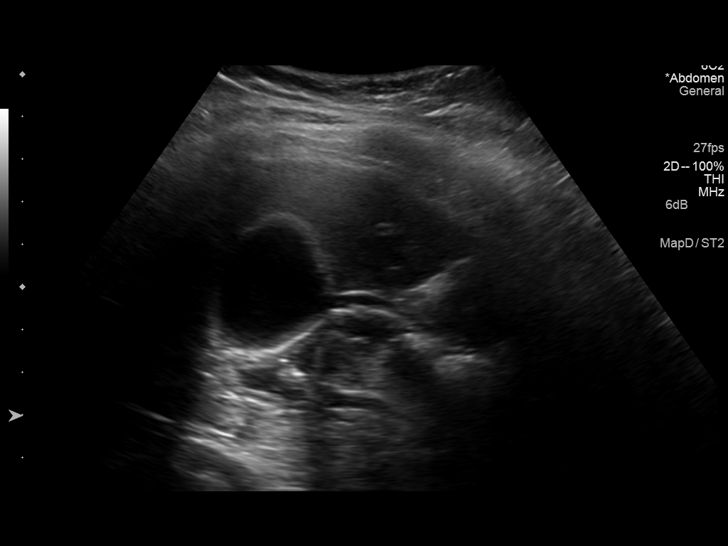
[im 44/48]
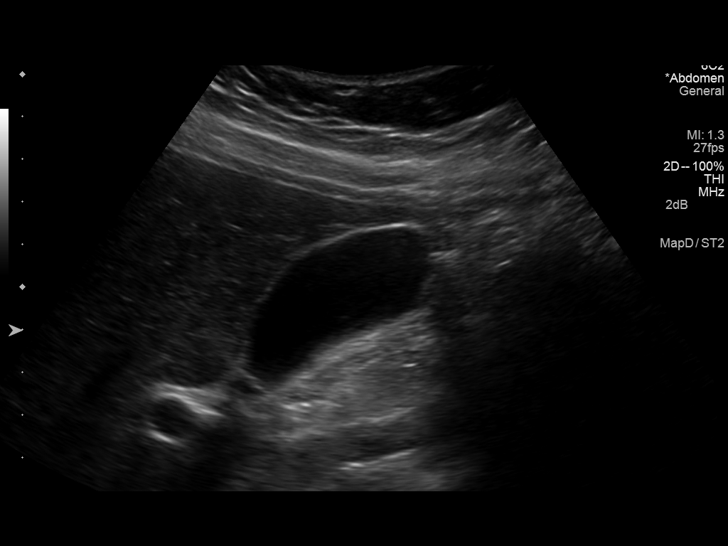
[im 48/48]
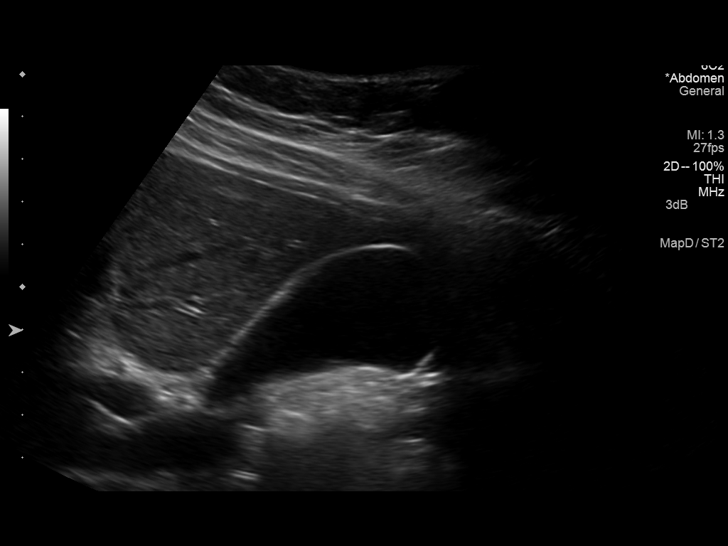

[14 of 25 positions shown; findings below may reference images not displayed]

FINDINGS: Gallbladder:

The gallbladder is visualized and no gallstones are noted. There is
no pain over the gallbladder with compression.

Common bile duct:

Diameter: The common bile duct is normal measuring 3.3 mm in
diameter.

Liver:

The liver has a normal echogenic pattern. There is a hyper echogenic
focus in the right lobe of 1.4 x 1.0 x 1.1 cm consistent with
incidental hemangioma. No ductal dilatation is seen.
IMPRESSION: 1. No gallstones.
2. Probable incidental hemangioma in the right lobe of liver of
cm in diameter.

## 2015-03-22 ENCOUNTER — Encounter: Payer: Self-pay | Admitting: Obstetrics & Gynecology

## 2015-03-22 ENCOUNTER — Telehealth: Payer: Self-pay | Admitting: *Deleted

## 2015-03-22 NOTE — Telephone Encounter (Signed)
Pt sent the same message in a patient advise request. Request was sent to [place] by [facility]. I closed this encounter. [facility]

## 2015-03-25 ENCOUNTER — Ambulatory Visit: Payer: Medicaid Other | Admitting: Gastroenterology

## 2015-04-01 ENCOUNTER — Other Ambulatory Visit: Payer: Self-pay

## 2015-04-01 ENCOUNTER — Ambulatory Visit (INDEPENDENT_AMBULATORY_CARE_PROVIDER_SITE_OTHER): Payer: Medicaid Other | Admitting: Gastroenterology

## 2015-04-01 ENCOUNTER — Encounter: Payer: Self-pay | Admitting: Gastroenterology

## 2015-04-01 VITALS — BP 116/75 | HR 96 | Temp 97.3°F | Ht 65.0 in | Wt 148.2 lb

## 2015-04-01 DIAGNOSIS — R197 Diarrhea, unspecified: Secondary | ICD-10-CM | POA: Insufficient documentation

## 2015-04-01 DIAGNOSIS — K219 Gastro-esophageal reflux disease without esophagitis: Secondary | ICD-10-CM | POA: Diagnosis not present

## 2015-04-01 DIAGNOSIS — K625 Hemorrhage of anus and rectum: Secondary | ICD-10-CM

## 2015-04-01 MED ORDER — PEG-KCL-NACL-NASULF-NA ASC-C 100 G PO SOLR: 1.0000 | ORAL | Status: DC

## 2015-04-01 NOTE — Progress Notes (Signed)
Referring Provider: Renee Rival, NP Primary Care Physician:  Renee Rival, NP  Primary GI: Dr. Oneida Alar   Chief Complaint  Patient presents with  . Abdominal Pain    HPI:   Julia Stanley is a 29 y.o. female presenting today with a history of GERD, chronic abdominal pain and diarrhea. Low-volume hematochezia in past but has put off a colonoscopy due to family issues. Stool studies negative in past, celiac serologies negative. Would like to pursue a colonoscopy now.    Viberzi 100 mg BID with some improvement but still with breakthrough abdominal cramping and loose stool. Occasional low-volume hematochezia, which she attributes to hemorrhoids. Can be really severe at times. Still taking Bentyl as needed.   Dexilant for GERD. Improved overall. Occasional problems. Cut down on [facility]. Cut back on [employer].   Past Medical History  Diagnosis Date  . Anxiety   . Seasonal allergies   . Abnormal Pap smear   . Nicotine addiction 10/27/2013    Dips since age 14  . Vaginal discharge 03/10/2014  . Frequent headaches     Past Surgical History  Procedure Laterality Date  . None      Current Outpatient Prescriptions  Medication Sig Dispense Refill  . clonazePAM (KLONOPIN) 1 MG tablet Take 1 mg by mouth daily.     Marland Kitchen dexlansoprazole (DEXILANT) 60 MG capsule Take 1 capsule (60 mg total) by mouth daily. 30 capsule 11  . dicyclomine (BENTYL) 10 MG capsule Take 1 capsule (10 mg total) by mouth 4 (four) times daily -  before meals and at bedtime. For abdominal cramping and loose stools. 120 capsule 3  . Eluxadoline 100 MG TABS Take 1 tablet by mouth 3 (three) times daily.    . fexofenadine (ALLEGRA) 180 MG tablet Take 180 mg by mouth daily.    Marland Kitchen HYDROcodone-acetaminophen (NORCO/VICODIN) 5-325 MG per tablet 1 tablet as needed.   0  . hydrocortisone (ANUSOL-HC) 2.5 % rectal cream Place 1 application rectally 2 (two) times daily. For 7 days 30 g 1  . medroxyPROGESTERone  (DEPO-PROVERA) 150 MG/ML injection Inject 1 mL (150 mg total) into the muscle every 3 (three) months. 1 mL 3  . ondansetron (ZOFRAN) 4 MG tablet Take 4 mg by mouth every 8 (eight) hours as needed for nausea or vomiting.    . Probiotic Product (PROBIOTIC DAILY PO) Take 1 tablet by mouth daily.    . rizatriptan (MAXALT) 10 MG tablet Take 1 tablet (10 mg total) by mouth as needed for migraine. May repeat in 2 hours if needed 10 tablet 0  . valACYclovir (VALTREX) 1000 MG tablet Take 2 for cold sores as needed 30 tablet 1  . zolpidem (AMBIEN) 10 MG tablet Take 10 mg by mouth at bedtime as needed for sleep.    . promethazine (PHENERGAN) 25 MG tablet Take 25 mg by mouth every 6 (six) hours as needed for nausea or vomiting.     No current facility-administered medications for this visit.    Allergies as of 04/01/2015 - Review Complete 04/01/2015  Allergen Reaction Noted  . Aspirin  05/20/2012  . Penicillins  01/23/2013  . Sulfa antibiotics  05/20/2012  . Phenergan [promethazine hcl] Rash 10/27/2013    Family History  Problem Relation Age of Onset  . Cancer Father   . Hypertension Maternal Grandfather   . Hyperlipidemia Maternal Grandfather   . Diabetes Maternal Grandfather   . Hypertension Paternal Grandmother   . Cancer Paternal Grandfather   .  Colon cancer Neg Hx     History   Social History  . Marital Status: Single    Spouse Name: N/A  . Number of Children: N/A  . Years of Education: N/A   Occupational History  . disability    Social History Main Topics  . Smoking status: Never Smoker   . Smokeless tobacco: Current User    Types: Snuff     Comment: "Been dipping since I was 7"  . Alcohol Use: No  . Drug Use: No  . Sexual Activity: Yes    Birth Control/ Protection: Injection   Other Topics Concern  . None   Social History Narrative    Review of Systems: As mentioned in HPI  Physical Exam: BP 116/75 mmHg  Pulse 96  Temp(Src) 97.3 F (36.3 C)  Ht 5\' 5"  (1.651  m)  Wt 148 lb 3.2 oz (67.223 kg)  BMI 24.66 kg/m2 General:   Alert and oriented. No distress noted. Pleasant and cooperative.  Head:  Normocephalic and atraumatic. Eyes:  Conjuctiva clear without scleral icterus. Mouth:  Oral mucosa pink and moist. Good dentition. No lesions. Heart:  S1, S2 present without murmurs, rubs, or gallops. Regular rate and rhythm. Abdomen:  +BS, soft, non-tender and non-distended. No rebound or guarding. No HSM or masses noted. Msk:  Symmetrical without gross deformities. Normal posture. Neurologic:  Alert and  oriented x4;  grossly normal neurologically. Skin:  Intact without significant lesions or rashes. Psych:  Alert and cooperative. Normal mood and affect.

## 2015-04-01 NOTE — Patient Instructions (Signed)
We have scheduled you for a colonoscopy with Dr. Oneida Alar in the near future.  Continue Dexilant, Viberzi, and only use Bentyl as needed.

## 2015-04-04 ENCOUNTER — Other Ambulatory Visit: Payer: Self-pay | Admitting: Gastroenterology

## 2015-04-04 NOTE — Assessment & Plan Note (Signed)
29 year old female with abdominal pain and cramping, associated diarrhea, with mild improvement with Viberzi 100 mg BID and requiring intermittent Bentyl for supportive measures. Low-volume hematochezia historically. Needs further evaluation for etiology such as evolving IBD. As of note, stool studies, celiac serologies all negative. Dietary and behavior actions playing a role in overall symptomatology, but she has cut down on [employer] and Mtn Dew intake.   Proceed with colonoscopy with Dr. Oneida Alar in the near future. The risks, benefits, and alternatives have been discussed in detail with the patient. They state understanding and desire to proceed.  Phenergan 25 mg IV on call Hemorrhoid banding if appropriate at time of procedure

## 2015-04-04 NOTE — Assessment & Plan Note (Signed)
Doing well with Dexilant. No alarm features. Continue Dexilant, dietary and behavior modification.

## 2015-04-06 NOTE — Progress Notes (Signed)
cc'd to pcp 

## 2015-04-11 ENCOUNTER — Encounter (HOSPITAL_COMMUNITY)
Admission: RE | Disposition: A | Discharge status: Home / Self Care | Payer: Self-pay | Source: Ambulatory Visit | Attending: Gastroenterology

## 2015-04-11 ENCOUNTER — Encounter (HOSPITAL_COMMUNITY): Payer: Self-pay | Admitting: *Deleted

## 2015-04-11 ENCOUNTER — Ambulatory Visit (HOSPITAL_COMMUNITY)
Admission: RE | Admit: 2015-04-11 | Discharge: 2015-04-11 | Disposition: A | Discharge status: Home / Self Care | Payer: Medicaid Other | Source: Ambulatory Visit | Attending: Gastroenterology | Admitting: Gastroenterology

## 2015-04-11 DIAGNOSIS — Z8371 Family history of colonic polyps: Secondary | ICD-10-CM | POA: Insufficient documentation

## 2015-04-11 DIAGNOSIS — D127 Benign neoplasm of rectosigmoid junction: Secondary | ICD-10-CM | POA: Diagnosis not present

## 2015-04-11 DIAGNOSIS — K648 Other hemorrhoids: Secondary | ICD-10-CM | POA: Diagnosis not present

## 2015-04-11 DIAGNOSIS — K625 Hemorrhage of anus and rectum: Secondary | ICD-10-CM | POA: Diagnosis not present

## 2015-04-11 DIAGNOSIS — K649 Unspecified hemorrhoids: Secondary | ICD-10-CM

## 2015-04-11 DIAGNOSIS — Z79891 Long term (current) use of opiate analgesic: Secondary | ICD-10-CM | POA: Diagnosis not present

## 2015-04-11 DIAGNOSIS — Z7952 Long term (current) use of systemic steroids: Secondary | ICD-10-CM | POA: Insufficient documentation

## 2015-04-11 DIAGNOSIS — J302 Other seasonal allergic rhinitis: Secondary | ICD-10-CM | POA: Diagnosis not present

## 2015-04-11 DIAGNOSIS — F17290 Nicotine dependence, other tobacco product, uncomplicated: Secondary | ICD-10-CM | POA: Insufficient documentation

## 2015-04-11 DIAGNOSIS — Z79899 Other long term (current) drug therapy: Secondary | ICD-10-CM | POA: Insufficient documentation

## 2015-04-11 DIAGNOSIS — R197 Diarrhea, unspecified: Secondary | ICD-10-CM

## 2015-04-11 DIAGNOSIS — F419 Anxiety disorder, unspecified: Secondary | ICD-10-CM | POA: Insufficient documentation

## 2015-04-11 DIAGNOSIS — Z793 Long term (current) use of hormonal contraceptives: Secondary | ICD-10-CM | POA: Insufficient documentation

## 2015-04-11 DIAGNOSIS — R109 Unspecified abdominal pain: Secondary | ICD-10-CM | POA: Diagnosis present

## 2015-04-11 HISTORY — PX: HEMORRHOID BANDING: SHX5850

## 2015-04-11 HISTORY — PX: COLONOSCOPY: SHX5424

## 2015-04-11 SURGERY — COLONOSCOPY
Anesthesia: Moderate Sedation

## 2015-04-11 MED ORDER — MIDAZOLAM HCL 5 MG/5ML IJ SOLN
INTRAMUSCULAR | Status: AC
  Filled 2015-04-11: qty 10

## 2015-04-11 MED ORDER — SODIUM CHLORIDE 0.9 % IV SOLN
INTRAVENOUS | Status: DC
  Administered 2015-04-11: 08:00:00 via INTRAVENOUS

## 2015-04-11 MED ORDER — MEPERIDINE HCL 100 MG/ML IJ SOLN
INTRAMUSCULAR | Status: AC
  Filled 2015-04-11: qty 2

## 2015-04-11 MED ORDER — PROMETHAZINE HCL 25 MG/ML IJ SOLN
INTRAMUSCULAR | Status: AC
  Filled 2015-04-11: qty 1

## 2015-04-11 MED ORDER — MEPERIDINE HCL 100 MG/ML IJ SOLN
INTRAMUSCULAR | Status: DC | PRN
  Administered 2015-04-11 (×4): 25 mg via INTRAVENOUS

## 2015-04-11 MED ORDER — STERILE WATER FOR IRRIGATION IR SOLN
Status: DC | PRN
  Administered 2015-04-11: 09:00:00

## 2015-04-11 MED ORDER — PROMETHAZINE HCL 25 MG/ML IJ SOLN
25.0000 mg | Freq: Once | INTRAMUSCULAR | Status: AC
  Administered 2015-04-11: 25 mg via INTRAVENOUS

## 2015-04-11 MED ORDER — SODIUM CHLORIDE 0.9 % IJ SOLN
INTRAMUSCULAR | Status: AC
Start: 2015-04-11 — End: 2015-04-11
  Filled 2015-04-11: qty 3

## 2015-04-11 MED ORDER — MIDAZOLAM HCL 5 MG/5ML IJ SOLN
INTRAMUSCULAR | Status: DC | PRN
  Administered 2015-04-11: 1 mg via INTRAVENOUS
  Administered 2015-04-11 (×2): 2 mg via INTRAVENOUS

## 2015-04-11 NOTE — Discharge Instructions (Signed)
You have internal hemorrhoids. YOU had one POLYP removed. I PLACED 3 BAND TO TREAT YOUR HEMORRHOIDAL BLEEDING. YOU MAY SOME MILD BLEEDING OVER THE NEXT 3 TO 5 DAYS.    STOP BENTYL. TAKE VIBERZI ONE TABLET TWICE DAILY. USE ONLY IMODIUM 1 OR 2 TIMES A DAY IF NEEDED TO CONTROL LOOSE STOOLS.  DO NOT USE LOTRENEX, LEVSIN, OR BENTYL WITH VIBERZI.  YOU MUST CALL IF YOU GO MORE THAN 4 DAYS WITHOUT A BOWEL MOVEMENT OR YOU DEVELOP INCREASE IN NAUSEA, VOMITING, OR ABDOMINAL PAIN.   CALL [phone] IF YOU HAVE A FEVER, A LARGE AMOUNT OF BLEEDING, OR DIFFICULTY URINATING.  DRINK WATER TO KEEP URINE LIGHT YELLOW.  YOU MAY USE NAPROXEN OR IBUPROFEN TWICE DAILY FOR RECTAL DISCOMFORT. TYLENOL AS NEED FOR ADDITIONAL PAIN RELIEF.  IF NEEDED USE COLACE TWICE DAILY TO SOFTEN STOOL.  YOUR BIOPSY RESULTS WILL BE AVAILABLE IN MY CHART AFTER JUN 23 AND MY OFFICE WILL CONTACT YOU IN 10-14 DAYS WITH YOUR RESULTS.   FOLLOW A LOW RESIDUE DIET UNTIL JUL 3. SEE INFO BELOW.  FOLLOW UP IN 3 MOS.  Next colonoscopy in 3-5 years.    Colonoscopy Care After Read the instructions outlined below and refer to this sheet in the next week. These discharge instructions provide you with general information on caring for yourself after you leave the hospital. While your treatment has been planned according to the most current medical practices available, unavoidable complications occasionally occur. If you have any problems or questions after discharge, call DR. Jaynie Hitch, [phone].  ACTIVITY  You may resume your regular activity, but move at a slower pace for the next 24 hours.   Take frequent rest periods for the next 24 hours.   Walking will help get rid of the air and reduce the bloated feeling in your belly (abdomen).   No driving for 24 hours (because of the medicine (anesthesia) used during the test).   You may shower.   Do not sign any important legal documents or operate any machinery for 24 hours  (because of the anesthesia used during the test).    NUTRITION  Drink plenty of fluids.   You may resume your normal diet as instructed by your doctor.   Begin with a light meal and progress to your normal diet. Heavy or fried foods are harder to digest and may make you feel sick to your stomach (nauseated).   Avoid alcoholic beverages for 24 hours or as instructed.    MEDICATIONS  You may resume your normal medications.   WHAT YOU CAN EXPECT TODAY  Some feelings of bloating in the abdomen.   Passage of more gas than usual.   Spotting of blood in your stool or on the toilet paper  .  IF YOU HAD POLYPS REMOVED DURING THE COLONOSCOPY:  Eat a soft diet IF YOU HAVE NAUSEA, BLOATING, ABDOMINAL PAIN, OR VOMITING.    FINDING OUT THE RESULTS OF YOUR TEST Not all test results are available during your visit. DR. Oneida Alar WILL CALL YOU WITHIN 14 DAYS OF YOUR PROCEDUE WITH YOUR RESULTS. Do not assume everything is normal if you have not heard from DR. Ramell Wacha, CALL HER OFFICE AT [phone].  SEEK IMMEDIATE MEDICAL ATTENTION AND CALL THE OFFICE: [phone] IF:  You have more than a spotting of blood in your stool.   Your belly is swollen (abdominal distention).   You are nauseated or vomiting.   You have a temperature over 101F.   You have abdominal pain or  discomfort that is severe or gets worse throughout the day.   HEMORRHOIDAL BANDING COMPLICATIONS:  COMMON: 1. MINOR PAIN  UNCOMMON: 1. ABSCESS 2. BAND FALLS OFF 3. PROLAPSE OF HEMORRHOIDS AND PAIN 4. ULCER BLEEDING  A. USUALLY SELF-LIMITED: MAY LAST 3-5 DAYS  B. MAY REQUIRE INTERVENTION: 1-2 WEEKS AFTER INTERACTIONS 5. NECROTIZING PELVIC SEPSIS  A. SYMPTOMS: FEVER, PAIN, DIFFICULTY urinating   Hemorrhoids Hemorrhoids are dilated (enlarged) veins around the rectum. Sometimes clots will form in the veins. This makes them swollen and painful. These are called thrombosed hemorrhoids. Causes of hemorrhoids  include:  Constipation.   Straining to have a bowel movement.   HEAVY LIFTING  HOME CARE INSTRUCTIONS  Eat a well balanced diet and drink 6 to 8 glasses of water every day to avoid constipation. You may also use a bulk laxative.   Avoid straining to have bowel movements.   Keep anal area dry and clean.   Do not use a donut shaped pillow or sit on the toilet for long periods. This increases blood pooling and pain.   Move your bowels when your body has the urge; this will require less straining and will decrease pain and pressure.  LOW RESIDUE DIET  A low-residue diet, aka low-fiber diet, is usually recommended. An intake of less than 10 grams of fiber per day is generally considered a low-residue diet.  LOW RESIDUE Diet  Grain Products: enriched refined white bread, buns, bagels, English muffins  plain cereals e.g. Cheerios, Cornflakes, Cream of Wheat, Rice Krispies, Special K  arrowroot cookies, tea biscuits, soda crackers, plain melba toast  white rice, refined pasta and noodles  avoid whole grains   Fruits: fruit juices except prune juice  applesauce, apricots, banana (1/2), cantaloupe, canned fruit cocktail, grapes, honeydew melon, peaches, watermelon  avoid raw and dried fruits, and berries.   Vegetables: vegetable juices  potatoes (no Skin)  alfalfa sprouts, beets, green/yellow beans, carrots, celery, cucumber, eggplant, lettuce, mushrooms, green/red peppers, potatoes (peeled), squash, zucchini  avoid vegetables from the cruciferous family such as broccoli, cauliflower, brussels sprouts, cabbage, kale, Swiss chard, onion, etc   Meat and Protein Choice: well-cooked, tender meat, fish and eggs  avoid beans  avoid all nuts and seeds, as well as foods that may contain seeds (such as yogurt)   Dairy: NO RESTRICTIONS   Drinks: juices  tea and coffee   avoid alcohol

## 2015-04-11 NOTE — Progress Notes (Signed)
REVIEWED-NO ADDITIONAL RECOMMENDATIONS. 

## 2015-04-11 NOTE — Op Note (Signed)
[facility] [address] [place], [address]   COLONOSCOPY PROCEDURE REPORT  PATIENT: Julia Stanley, Julia Stanley  MR#: [id number] BIRTHDATE: 09/01/1986 , 28  yrs. old GENDER: female ENDOSCOPIST: Danie Binder, MD REFERRED PF:[id number] Strader, NP PROCEDURE DATE:  May 04, 2015 PROCEDURE:   Colonoscopy with biopsy and Colonoscopy with snare polypectomy INDICATIONS:rectal bleeding, diarrhea, and abdominal pain. GRANDMOTHER/AUNT HAD POLYPS. MOTHER NO TCS. MEDICATIONS: Promethazine (Phenergan) 25 mg IV, Demerol 100 mg IV, and Versed 5 mg IV  DESCRIPTION OF PROCEDURE:    Physical exam was performed.  Informed consent was obtained from the patient after explaining the benefits, risks, and alternatives to procedure.  The patient was connected to monitor and placed in left lateral position. Continuous oxygen was provided by nasal cannula and IV medicine administered through an indwelling cannula.  After administration of sedation and rectal exam, the patients rectum was intubated and the EC-3890Li ([id number])  colonoscope was advanced under direct visualization to the ileum.  The scope was removed slowly by carefully examining the color, texture, anatomy, and integrity mucosa on the way out.  The patient was recovered in endoscopy and discharged home in satisfactory condition. Estimated blood loss is zero unless otherwise noted in this procedure report.    COLON FINDINGS: The examined terminal ileum appeared to be normal. , A sessile polyp measuring 6 mm in size was found in the rectum. A polypectomy was performed using snare cautery.  , and Large internal hemorrhoids were found. 3 BANDS APPLIED.  PREP QUALITY: excellent.   CECAL W/D TIME: 14       minutes  COMPLICATIONS: None  ENDOSCOPIC IMPRESSION: 1.   NO SOURCE FOR DIARRHEA/ABDOMINAL PAIN IDENTIFIED 2.   ONE COLON polyp REMOVED 3.   Large internal hemorrhoids  RECOMMENDATIONS: STOP BENTYL.  TAKE VIBERZI ONE TABLET TWICE  DAILY.  USE ONLY IMODIUM 1 OR 2 TIMES A DAY IF NEEDED TO CONTROL LOOSE STOOLS. DO NOT USE LOTRENEX, LEVSIN, OR BENTYL WITH VIBERZI. CALL FOR MORE THAN 4 DAYS WITHOUT A BOWEL MOVEMENT OR DEVELOP INCREASE IN NAUSEA, VOMITING, OR ABDOMINAL PAIN. CALL [phone] IF YOU HAVE A FEVER, A LARGE AMOUNT OF BLEEDING, OR DIFFICULTY URINATING. DRINK WATER TO KEEP URINE LIGHT YELLOW. USE NAPROXEN OR IBUPROFEN TWICE DAILY FOR RECTAL DISCOMFORT. TYLENOL AS NEED FOR ADDITIONAL PAIN RELIEF. IF NEEDED USE COLACE TWICE DAILY TO SOFTEN STOOL. AWAIT BIOPSY RESULTS. FOLLOW A LOW RESIDUE DIET UNTIL JUL 3. FOLLOW UP IN 3 MOS. Next colonoscopy in 3-5 years.  eSigned:  Danie Binder, MD 05-04-2015 2:26 PM  CPT CODES: ICD CODES:  The ICD and CPT codes recommended by this software are interpretations from the data that the clinical staff has captured with the software.  The verification of the translation of this report to the ICD and CPT codes and modifiers is the sole responsibility of the health care institution and practicing physician where this report was generated.  [employer] will not be held responsible for the validity of the ICD and CPT codes included on this report.  AMA assumes no liability for data contained or not contained herein. CPT is a Designer, television/film set of the [employer].

## 2015-04-11 NOTE — H&P (Signed)
Primary Care Physician:  Renee Rival, NP Primary Gastroenterologist:  Dr. Oneida Alar  Pre-Procedure History & Physical: HPI:  Julia Stanley is a 29 y.o. female here for Bloody diarrhea.  Past Medical History  Diagnosis Date  . Anxiety   . Seasonal allergies   . Abnormal Pap smear   . Nicotine addiction 10/27/2013    Dips since age 68  . Vaginal discharge 03/10/2014  . Frequent headaches     Past Surgical History  Procedure Laterality Date  . None      Prior to Admission medications   Medication Sig Start Date End Date Taking? Authorizing Provider  acetaminophen (TYLENOL) 650 MG CR tablet Take 650-1,300 mg by mouth every 8 (eight) hours as needed (headache).   Yes Historical Provider, MD  clonazePAM (KLONOPIN) 1 MG tablet Take 1 mg by mouth 2 (two) times daily.    Yes Historical Provider, MD  dexlansoprazole (DEXILANT) 60 MG capsule Take 1 capsule (60 mg total) by mouth daily. 02/09/15  Yes Mahala Menghini, PA-C  dicyclomine (BENTYL) 10 MG capsule TAKE 1 CAPSULE 4 TIMES A DAY BEFORE MEALS & AT BEDTIME FOR ABDOMINAL CRAMPING/LOOSE STOOLS 04/04/15  Yes Carlis Stable, NP  diphenhydrAMINE (SOMINEX) 25 MG tablet Take 25 mg by mouth at bedtime as needed for itching or sleep.   Yes Historical Provider, MD  Eluxadoline 100 MG TABS Take 1 tablet by mouth 2 (two) times daily.    Yes Historical Provider, MD  fexofenadine (ALLEGRA) 180 MG tablet Take 180 mg by mouth daily.   Yes Historical Provider, MD  HYDROcodone-acetaminophen (NORCO/VICODIN) 5-325 MG per tablet Take 1 tablet by mouth every 4 (four) hours as needed for moderate pain.  11/05/14  Yes Historical Provider, MD  hydrocortisone (ANUSOL-HC) 2.5 % rectal cream Place 1 application rectally 2 (two) times daily. For 7 days 01/04/15  Yes Orvil Feil, NP  ondansetron (ZOFRAN) 4 MG tablet Take 4 mg by mouth every 8 (eight) hours as needed for nausea or vomiting.   Yes Historical Provider, MD  peg 3350 powder (MOVIPREP) 100 G SOLR Take 1 kit (200  g total) by mouth as directed. 04/01/15  Yes Danie Binder, MD  Probiotic Product (PROBIOTIC DAILY PO) Take 1 tablet by mouth daily.   Yes Historical Provider, MD  promethazine (PHENERGAN) 25 MG tablet Take 25 mg by mouth every 6 (six) hours as needed for nausea or vomiting.   Yes Historical Provider, MD  valACYclovir (VALTREX) 1000 MG tablet Take 2 for cold sores as needed 02/23/14  Yes Estill Dooms, NP  zolpidem (AMBIEN) 10 MG tablet Take 10 mg by mouth at bedtime as needed for sleep.   Yes Historical Provider, MD  medroxyPROGESTERone (DEPO-PROVERA) 150 MG/ML injection Inject 1 mL (150 mg total) into the muscle every 3 (three) months. 03/01/15   Estill Dooms, NP  rizatriptan (MAXALT) 10 MG tablet Take 1 tablet (10 mg total) by mouth as needed for migraine. May repeat in 2 hours if needed 03/01/15   Estill Dooms, NP    Allergies as of 04/01/2015 - Review Complete 04/01/2015  Allergen Reaction Noted  . Aspirin  05/20/2012  . Penicillins  01/23/2013  . Sulfa antibiotics  05/20/2012  . Phenergan [promethazine hcl] Rash 10/27/2013    Family History  Problem Relation Age of Onset  . Cancer Father   . Hypertension Maternal Grandfather   . Hyperlipidemia Maternal Grandfather   . Diabetes Maternal Grandfather   . Hypertension Paternal Grandmother   .  Cancer Paternal Grandfather   . Colon cancer Neg Hx     History   Social History  . Marital Status: Single    Spouse Name: N/A  . Number of Children: N/A  . Years of Education: N/A   Occupational History  . disability    Social History Main Topics  . Smoking status: Never Smoker   . Smokeless tobacco: Current User    Types: Snuff     Comment: "Been dipping since I was 7"  . Alcohol Use: No  . Drug Use: No  . Sexual Activity: Yes    Birth Control/ Protection: Injection   Other Topics Concern  . Not on file   Social History Narrative    Review of Systems: See HPI, otherwise negative ROS   Physical Exam: BP  104/63 mmHg  Pulse 68  Temp(Src) 98.2 F (36.8 C) (Oral)  Resp 18  Ht 5' 5"  (1.651 m)  Wt 148 lb (67.132 kg)  BMI 24.63 kg/m2  SpO2 100% General:   Alert,  pleasant and cooperative in NAD Head:  Normocephalic and atraumatic. Neck:  Supple; Lungs:  Clear throughout to auscultation.    Heart:  Regular rate and rhythm. Abdomen:  Soft, nontender and nondistended. Normal bowel sounds, without guarding, and without rebound.   Neurologic:  Alert and  oriented x4;  grossly normal neurologically.  Impression/Plan:    Bloody diarrhea  PLAN: 1. TCS TODAY-COLON Bx/?HEMORRHOID BANDING

## 2015-04-12 ENCOUNTER — Telehealth: Payer: Self-pay

## 2015-04-12 ENCOUNTER — Encounter (HOSPITAL_COMMUNITY): Payer: Self-pay | Admitting: Gastroenterology

## 2015-04-12 MED ORDER — LIDOCAINE 5 % EX OINT: TOPICAL_OINTMENT | CUTANEOUS | Status: DC

## 2015-04-12 NOTE — Telephone Encounter (Signed)
Pt is aware.  

## 2015-04-12 NOTE — Telephone Encounter (Signed)
PLEASE CALL PT. WE DO NOT PRESCRIBE PAIN MEDS FOR HEMORRHOID PAIN. SHE CAN HAVE A RX FOR LIDOCAINE ointment TO PUT ON HER BOTTOM TO NUMB IT UP. rx sent to [facility] drug. SHE CAN USE SITZ BATHS 4 TIMES A DAY FOR PAIN RELIEF.   SHE CAN ALSO USE CUT HER HYDROCODONE IN HALF IF A WHOLE TABLET IS CAUSING HER TO BE DROWSY. TYLENOL ALTERNATION WITH HER HYDROCOD

## 2015-04-12 NOTE — Telephone Encounter (Signed)
Pt called and said she had colonoscopy yesterday and has been having some rectal pain and lower back pain. She took one of her Hydrocodone that Dr. Luna Glasgow had given her, and it makes her so sleepy, she doesn't want to take it in the day. She said she cannot take ASA products because they cause nose bleeds.  She is requesting some Tramadol for her rectal pain. Please advise!

## 2015-04-14 ENCOUNTER — Other Ambulatory Visit: Payer: Self-pay

## 2015-04-14 ENCOUNTER — Telehealth: Payer: Self-pay

## 2015-04-14 DIAGNOSIS — R109 Unspecified abdominal pain: Secondary | ICD-10-CM

## 2015-04-14 NOTE — Telephone Encounter (Signed)
PLEASE CALL PT. SHE SHOULD STOP THE VIBERZI. FOLLOW A FULL LIQUID DIET FOR 3 DAYS AND HAVE HER LIPASE CHECKED.

## 2015-04-14 NOTE — Telephone Encounter (Signed)
Pt called back said she had colonoscopy on 04/11/2015 and Dr. Oneida Alar told her if she has not had a BM in 3 days to call. She said she is in pain, she feels like she needs to have BM, but cannot have one. Please advise!

## 2015-04-14 NOTE — Telephone Encounter (Signed)
Pt is aware and will go to the lab when she can get transportation. Lab order faxed to [facility].

## 2015-04-14 NOTE — Telephone Encounter (Signed)
Pt is calling and would like to speak with [facility] nurse. Her call back number is [phone]

## 2015-04-15 ENCOUNTER — Telehealth: Payer: Self-pay | Admitting: Gastroenterology

## 2015-04-15 NOTE — Telephone Encounter (Signed)
Patient called with concern for rectal discomfort and needing to have a BM.   Currently holding Viberzi and all other anti-diarrheals as recommended after her colonoscopy with hemorrhoid banding on Monday.   Lidocaine ointment helping with external rectal discomfort but still with internal discomfort especially after BM today. Stool was soft. Feels like incomplete. Performing Sitz baths.  Advised to continue stool softner daily for now. May add dulcolax 10mg  today and tomorrow if needed. Patient with tendency to diarrhea so try to avoid too much laxatives. Can try miralax one to two capfuls daily as alternative. Insert lidocaine gently into rectal canal tid for pain control.

## 2015-04-18 ENCOUNTER — Other Ambulatory Visit: Payer: Self-pay | Admitting: *Deleted

## 2015-04-18 MED ORDER — RIZATRIPTAN BENZOATE 10 MG PO TABS: 10.0000 mg | ORAL_TABLET | ORAL | Status: DC | PRN

## 2015-04-19 ENCOUNTER — Telehealth: Payer: Self-pay | Admitting: Gastroenterology

## 2015-04-19 ENCOUNTER — Encounter: Payer: Self-pay | Admitting: Gastroenterology

## 2015-04-19 NOTE — Telephone Encounter (Signed)
Patient called to see if we had her procedure and lab results back yet. I told the nurse would be calling her once SF reviews and signs off. Pt is aware that she has a 3 month follow up in Sept. Please call [phone] when results are available

## 2015-04-20 ENCOUNTER — Telehealth: Payer: Self-pay | Admitting: Gastroenterology

## 2015-04-20 NOTE — Telephone Encounter (Signed)
Sorry, Vicente Males. I called and informed pt of the Lipase. She is not having a lot of abdominal pain now, just uncomfortable where it has been 3 days since she had a good BM. She said she is taking a stool softner and Miralax daily. Please advise!

## 2015-04-20 NOTE — Telephone Encounter (Signed)
I saw the Lipase was normal and I told pt. She is aware we have 10-14 business days to call for results and the procedure was done on 04/11/2015.

## 2015-04-20 NOTE — Telephone Encounter (Signed)
Pt is aware.  

## 2015-04-20 NOTE — Telephone Encounter (Signed)
See previous phone call today.

## 2015-04-20 NOTE — Telephone Encounter (Signed)
REVIEWED. CONTINUE SUPPORTIVE CARE. HOLD VIBERZI. PT SHOULD CALL IF SHE STARTS TO HAVE DIARRHEA.

## 2015-04-20 NOTE — Telephone Encounter (Signed)
Lipase results were on the fax this morning. I put them in AS box.

## 2015-04-20 NOTE — Telephone Encounter (Signed)
Tried to call pt, LMOM for a return call. Her procedure was done on 04/11/2015. I do not see results of Lipase, will find out where she had it done at.

## 2015-04-20 NOTE — Telephone Encounter (Signed)
Patient was returning a call to DS. I told her DS was not available at the moment and I would send a phone note to let nurse know that she had called.

## 2015-04-20 NOTE — Telephone Encounter (Signed)
I did not order this.  Lipase was 120. How is patient doing? It is elevated but not significantly elevated. If continued abdominal pain, recheck lipase and HFP stat.

## 2015-04-21 NOTE — Telephone Encounter (Addendum)
Please call pt. She had a simple adenoma. Please call pt. HER colon biopsies are normal. YOUR DIARRHEA IS DUE TO IBS.  DRINK WATER TO KEEP URINE LIGHT YELLOW.  YOU MAY USE NAPROXEN OR IBUPROFEN TWICE DAILY FOR RECTAL DISCOMFORT. TYLENOL AS NEED FOR ADDITIONAL PAIN RELIEF.  IF NEEDED USE COLACE TWICE DAILY TO SOFTEN STOOL.  AFTER YOU FULL LIQUID DIET IS COMPLETE, FOLLOW A LOW RESIDUE DIET UNTIL JUL 3, THEN FOLLOW A HIGH FIBER DIET. SEE INFO BELOW.  FOLLOW UP IN 3 MOS E30 IBS, HEMORRHOIDS.  Next colonoscopy in 5 years.       Marland Kitchen

## 2015-04-22 NOTE — Telephone Encounter (Signed)
Pt has a appt 07/12/15 2:00 with Vicente Males [facility] nic. Appt letter mailed.

## 2015-04-22 NOTE — Telephone Encounter (Signed)
Pt is aware.  

## 2015-05-24 ENCOUNTER — Encounter: Payer: Self-pay | Admitting: *Deleted

## 2015-05-24 ENCOUNTER — Ambulatory Visit (INDEPENDENT_AMBULATORY_CARE_PROVIDER_SITE_OTHER): Payer: Medicaid Other | Admitting: *Deleted

## 2015-05-24 ENCOUNTER — Ambulatory Visit: Payer: Medicaid Other | Admitting: Gastroenterology

## 2015-05-24 DIAGNOSIS — Z3202 Encounter for pregnancy test, result negative: Secondary | ICD-10-CM | POA: Diagnosis not present

## 2015-05-24 DIAGNOSIS — Z3042 Encounter for surveillance of injectable contraceptive: Secondary | ICD-10-CM | POA: Diagnosis not present

## 2015-05-24 LAB — POCT URINE PREGNANCY: Preg Test, Ur: NEGATIVE

## 2015-05-24 MED ORDER — MEDROXYPROGESTERONE ACETATE 150 MG/ML IM SUSP
150.0000 mg | Freq: Once | INTRAMUSCULAR | Status: AC
  Administered 2015-05-24: 150 mg via INTRAMUSCULAR

## 2015-05-24 NOTE — Progress Notes (Signed)
Pt here for Depo. Reports no problems at this time. Return in 12 weeks for next shot. JSY 

## 2015-05-30 ENCOUNTER — Other Ambulatory Visit: Payer: Self-pay | Admitting: Adult Health

## 2015-05-30 ENCOUNTER — Encounter: Payer: Self-pay | Admitting: Adult Health

## 2015-05-30 MED ORDER — FLUCONAZOLE 150 MG PO TABS: 150.0000 mg | ORAL_TABLET | Freq: Once | ORAL | Status: DC

## 2015-06-05 ENCOUNTER — Encounter: Payer: Self-pay | Admitting: Adult Health

## 2015-06-07 ENCOUNTER — Encounter: Payer: Self-pay | Admitting: Adult Health

## 2015-06-19 ENCOUNTER — Telehealth: Payer: Self-pay | Admitting: Internal Medicine

## 2015-06-19 NOTE — Telephone Encounter (Signed)
Pt called; IBS giving her a "fit"  Intermittant abd cramps and diarrhea. In retrospect, pt states she did pretty good with Bentyl and needs to get back on med.    I called in Rx for Bentyl 10mg  #30 no refills; one ac and qhs - prn abd cramps and diarrhea Sunday pm - [place]

## 2015-07-11 ENCOUNTER — Telehealth: Payer: Self-pay

## 2015-07-11 ENCOUNTER — Telehealth: Payer: Self-pay | Admitting: Gastroenterology

## 2015-07-11 MED ORDER — DICYCLOMINE HCL 10 MG PO CAPS: 10.0000 mg | ORAL_CAPSULE | Freq: Three times a day (TID) | ORAL | Status: DC

## 2015-07-11 NOTE — Telephone Encounter (Signed)
Per documentation, was on bentyl last. I sent this to pharmacy.

## 2015-07-11 NOTE — Telephone Encounter (Signed)
Pt is aware of Rx send to pharmacy

## 2015-07-11 NOTE — Telephone Encounter (Signed)
Pt is calling to see if she have some diarrhea medication called in to her pharmacy. Please advise

## 2015-07-11 NOTE — Telephone Encounter (Signed)
Patient said she is having diarrhea to often and needs something else called into [facility]. She asked for the nurse to call her back at [phone] I also reminded her that she has OV this Wednesday

## 2015-07-11 NOTE — Telephone Encounter (Signed)
Pt is aware new prescription for Bentyl has been sent in. She said that only helps her cramps. She got upset yesterday when her boyfriend called and told her he has someone else, and she started having diarrhea and has had 4-5 episodes today also.  She saw Laban Emperor, NP last and it was in June.  Please advise!

## 2015-07-12 ENCOUNTER — Ambulatory Visit: Payer: Medicaid Other | Admitting: Gastroenterology

## 2015-07-12 NOTE — Telephone Encounter (Signed)
Likely her symptoms were exacerbated by stress.

## 2015-07-12 NOTE — Telephone Encounter (Signed)
Pt is aware.  

## 2015-07-13 ENCOUNTER — Ambulatory Visit (INDEPENDENT_AMBULATORY_CARE_PROVIDER_SITE_OTHER): Payer: Medicaid Other | Admitting: Gastroenterology

## 2015-07-13 ENCOUNTER — Encounter: Payer: Self-pay | Admitting: Gastroenterology

## 2015-07-13 VITALS — BP 119/74 | HR 97 | Temp 97.3°F | Ht 63.0 in | Wt 144.6 lb

## 2015-07-13 DIAGNOSIS — K589 Irritable bowel syndrome without diarrhea: Secondary | ICD-10-CM | POA: Diagnosis not present

## 2015-07-13 NOTE — Progress Notes (Signed)
Referring Provider: Renee Rival, NP Primary Care Physician:  Renee Rival, NP  Primary GI: Dr. Oneida Alar   Chief Complaint  Patient presents with  . Follow-up  . Diarrhea    HPI:   29 year old female with history of GERD, IBS. Colonoscopy with adenoma but no source for diarrhea. Surveillance in 2021. Had abdominal pain with Viberzi, lipase mildly elevated at 120 in June this year. Stopped Viberzi. Bentyl recently called in.   States her boyfriend is seeing someone else. She is under a lot of stress. Diarrhea 2-3 times per day. Post-prandial. Intermittent abdominal cramping. Bentyl without much improvement. No rectal bleeding. Under a lot of stress.    Past Medical History  Diagnosis Date  . Anxiety   . Seasonal allergies   . Abnormal Pap smear   . Nicotine addiction 10/27/2013    Dips since age 52  . Vaginal discharge 03/10/2014  . Frequent headaches   . Polyp of colon     Past Surgical History  Procedure Laterality Date  . None    . Colonoscopy N/A 04/11/2015    [facility]: 1. No source for diarrhea/ abdominal pain identified. 2. One large polyp removed. 3. Large internal hemorrhoids. ADENOMA. Next surveillance 2021  . Hemorrhoid banding N/A 04/11/2015    Procedure: HEMORRHOID BANDING;  Surgeon: Danie Binder, MD;  Location: [facility];  Service: Endoscopy;  Laterality: N/A;  . Polyp removed  2016    Current Outpatient Prescriptions  Medication Sig Dispense Refill  . clonazePAM (KLONOPIN) 1 MG tablet Take 1 mg by mouth 2 (two) times daily.     Marland Kitchen dexlansoprazole (DEXILANT) 60 MG capsule Take 1 capsule (60 mg total) by mouth daily. 30 capsule 11  . dicyclomine (BENTYL) 10 MG capsule Take 1 capsule (10 mg total) by mouth 4 (four) times daily -  before meals and at bedtime. 120 capsule 3  . fexofenadine (ALLEGRA) 180 MG tablet Take 180 mg by mouth daily.    . fluconazole (DIFLUCAN) 150 MG tablet Take 1 tablet (150 mg total) by mouth once. 1 tablet 1  .  HYDROcodone-acetaminophen (NORCO/VICODIN) 5-325 MG per tablet Take 1 tablet by mouth every 4 (four) hours as needed for moderate pain.   0  . medroxyPROGESTERone (DEPO-PROVERA) 150 MG/ML injection Inject 1 mL (150 mg total) into the muscle every 3 (three) months. 1 mL 3  . rizatriptan (MAXALT) 10 MG tablet Take 1 tablet (10 mg total) by mouth as needed for migraine. May repeat in 2 hours if needed 10 tablet 1  . zolpidem (AMBIEN) 10 MG tablet Take 10 mg by mouth at bedtime as needed for sleep.     No current facility-administered medications for this visit.    Allergies as of 07/13/2015 - Review Complete 07/13/2015  Allergen Reaction Noted  . Aspirin Other (See Comments) 05/20/2012  . Sulfa antibiotics Hives 05/20/2012    Family History  Problem Relation Age of Onset  . Cancer Father   . Hypertension Maternal Grandfather   . Hyperlipidemia Maternal Grandfather   . Diabetes Maternal Grandfather   . Hypertension Paternal Grandmother   . Cancer Paternal Grandfather   . Colon cancer Neg Hx     Social History   Social History  . Marital Status: Single    Spouse Name: N/A  . Number of Children: N/A  . Years of Education: N/A   Occupational History  . disability    Social History Main Topics  . Smoking status:  Never Smoker   . Smokeless tobacco: Current User    Types: Snuff     Comment: "Been dipping since I was 7"  . Alcohol Use: No  . Drug Use: No  . Sexual Activity: Yes    Birth Control/ Protection: Injection   Other Topics Concern  . Not on file   Social History Narrative    Review of Systems: Negative unless mentioned in HPI.   Physical Exam: BP 119/74 mmHg  Pulse 97  Temp(Src) 97.3 F (36.3 C) (Oral)  Ht 5\' 3"  (1.6 m)  Wt 144 lb 9.6 oz (65.59 kg)  BMI 25.62 kg/m2 General:   Alert and oriented. No distress noted. Pleasant and cooperative.  Head:  Normocephalic and atraumatic. Eyes:  Conjuctiva clear without scleral icterus. Abdomen:  +BS, soft,  non-tender and non-distended. No rebound or guarding. No HSM or masses noted. Msk:  Symmetrical without gross deformities. Normal posture. Extremities:  Without edema. Neurologic:  Alert and  oriented x4;  grossly normal neurologically. Psych:  Alert and cooperative. Normal mood and affect.

## 2015-07-13 NOTE — Patient Instructions (Signed)
STOP BENTYL.   I want you to try Viberzi 75 mg ONCE each day with food. Please let me know how you are doing in a few days. Call with any changes in your status such as abdominal pain, severe constipation.   We will see you in 6 weeks.

## 2015-07-17 NOTE — Assessment & Plan Note (Addendum)
29 year old female with IBS-D, recent colonoscopy without occult issues. Symptoms exacerbated by stress. Historically was on Viberzi 100 mg BID but had abdominal pain and mild increase in lipase 120. Stopped and started Bentyl, now without much improvement. Discussed with Viberzi rep. No contraindications in restarting Viberzi at lower dose and following closely. If any further issues, would stop Viberzi. I have asked her to take Viberzi 75 mg ONCE per day and monitor symptoms. STOP bentyl while on viberzi.  Return in 6 weeks. Dietary modifications encouraged.

## 2015-07-18 NOTE — Progress Notes (Signed)
cc'd to pcp 

## 2015-07-26 ENCOUNTER — Telehealth: Payer: Self-pay | Admitting: Gastroenterology

## 2015-07-26 MED ORDER — ELUXADOLINE 75 MG PO TABS: 1.0000 | ORAL_TABLET | Freq: Two times a day (BID) | ORAL | Status: DC

## 2015-07-26 NOTE — Telephone Encounter (Signed)
Patient was seen recently and put on a prescription (she doesn't remember name) and said she was to call in a few weeks to let us know how it's working. She said the medication is helping her.

## 2015-07-26 NOTE — Telephone Encounter (Signed)
Pt is aware I have faxed the Viberzi Rx to [facility] and NOT TO [facility] while on the [place].

## 2015-07-26 NOTE — Telephone Encounter (Signed)
I gave her Viberzi. I am printing out to send to her pharmacy. Have her NOT TAKE BENTYL WHILE ON IT.Glad she is doing well!

## 2015-07-26 NOTE — Telephone Encounter (Signed)
Pt said the Bentyl is working great. She has a new phone number of [phone] and I have put it in the chart.

## 2015-07-28 ENCOUNTER — Telehealth: Payer: Self-pay | Admitting: Gastroenterology

## 2015-07-28 NOTE — Telephone Encounter (Signed)
Tried to call pt and could not reach her. Julia Stanley is working on the [place].

## 2015-07-28 NOTE — Telephone Encounter (Signed)
Patient called again today to let us know that the prescription that was called in for her that medicaid will not pay for it and she needs something else.

## 2015-07-29 NOTE — Telephone Encounter (Signed)
Tried to call pt to let her know her medication should be at the pharmacy. Some person answered and said she was not up yet.  I told her to just tell he to check with her pharmacy.

## 2015-08-09 ENCOUNTER — Telehealth: Payer: Self-pay

## 2015-08-09 MED ORDER — RIFAXIMIN 550 MG PO TABS: 550.0000 mg | ORAL_TABLET | Freq: Three times a day (TID) | ORAL | Status: DC

## 2015-08-09 NOTE — Telephone Encounter (Signed)
Pt called back. She is taking the Viberzi 75 mg bid. She has had diarrhea ( watery stools) 8 times during last night and today. She is having abdominal cramping also. Routing to [employer], PA in Anna's absence.

## 2015-08-09 NOTE — Telephone Encounter (Signed)
Pt said she at first got relief on the lower dose of Viberzi, but it didn't last long. She is aware we have sent the RX in and it will need PA. Almyra Free has received that). I am putting samples at the front for her Xifaxin 550 to take tid. She will try to get someone to come pick those up for her.  Routing to [place] to schedule the appt.

## 2015-08-09 NOTE — Telephone Encounter (Signed)
Pt returned your call. She said to call her back

## 2015-08-09 NOTE — Telephone Encounter (Signed)
Pt left Vm that Viberzi is not working, still having problems,'I returned called and LMOM for a return call to discuss.

## 2015-08-09 NOTE — Telephone Encounter (Signed)
Reviewed chart.   1. Has she had any improvement since started back on Viberzi at the lower dose? 2. I would like to see if Xifaxan 550mg  TID for 14 days will help her IBS-D. RX sent to pharmacy. 3. She needs OV with [facility] only, E30. Please make arrangements and d/c upcoming OV with Vicente Males.

## 2015-08-10 ENCOUNTER — Encounter: Payer: Self-pay | Admitting: Gastroenterology

## 2015-08-10 NOTE — Telephone Encounter (Signed)
Julia Stanley.

## 2015-08-11 NOTE — Telephone Encounter (Signed)
Noted and appreciated

## 2015-08-15 ENCOUNTER — Telehealth: Payer: Self-pay | Admitting: Gastroenterology

## 2015-08-15 ENCOUNTER — Encounter: Payer: Self-pay | Admitting: Adult Health

## 2015-08-15 ENCOUNTER — Ambulatory Visit (INDEPENDENT_AMBULATORY_CARE_PROVIDER_SITE_OTHER): Payer: Medicaid Other | Admitting: *Deleted

## 2015-08-15 ENCOUNTER — Encounter: Payer: Self-pay | Admitting: *Deleted

## 2015-08-15 ENCOUNTER — Ambulatory Visit (INDEPENDENT_AMBULATORY_CARE_PROVIDER_SITE_OTHER): Payer: Medicaid Other | Admitting: Adult Health

## 2015-08-15 VITALS — BP 90/66 | HR 62 | Ht 65.0 in | Wt 142.0 lb

## 2015-08-15 DIAGNOSIS — N390 Urinary tract infection, site not specified: Secondary | ICD-10-CM | POA: Diagnosis not present

## 2015-08-15 DIAGNOSIS — Z3042 Encounter for surveillance of injectable contraceptive: Secondary | ICD-10-CM

## 2015-08-15 DIAGNOSIS — Z3202 Encounter for pregnancy test, result negative: Secondary | ICD-10-CM

## 2015-08-15 DIAGNOSIS — R350 Frequency of micturition: Secondary | ICD-10-CM

## 2015-08-15 HISTORY — DX: Frequency of micturition: R35.0

## 2015-08-15 HISTORY — DX: Urinary tract infection, site not specified: N39.0

## 2015-08-15 LAB — POCT URINALYSIS DIPSTICK
Glucose, UA: NEGATIVE
Leukocytes, UA: NEGATIVE
NITRITE UA: POSITIVE
PROTEIN UA: NEGATIVE

## 2015-08-15 LAB — POCT URINE PREGNANCY: Preg Test, Ur: NEGATIVE

## 2015-08-15 MED ORDER — NITROFURANTOIN MONOHYD MACRO 100 MG PO CAPS: 100.0000 mg | ORAL_CAPSULE | Freq: Two times a day (BID) | ORAL | Status: DC

## 2015-08-15 MED ORDER — RIFAXIMIN 550 MG PO TABS: 550.0000 mg | ORAL_TABLET | Freq: Three times a day (TID) | ORAL | Status: DC

## 2015-08-15 MED ORDER — PHENAZOPYRIDINE HCL 200 MG PO TABS: 200.0000 mg | ORAL_TABLET | Freq: Three times a day (TID) | ORAL | Status: DC | PRN

## 2015-08-15 MED ORDER — MEDROXYPROGESTERONE ACETATE 150 MG/ML IM SUSP
150.0000 mg | Freq: Once | INTRAMUSCULAR | Status: AC
  Administered 2015-08-15: 150 mg via INTRAMUSCULAR

## 2015-08-15 NOTE — Telephone Encounter (Signed)
I called pt. She said she just left pharmacy and the [facility] is still saying something else needs verification. I told her I will let Almyra Free know.  I also told pt she is NOT to continue Viberzie with the Xifaxin.

## 2015-08-15 NOTE — Progress Notes (Signed)
Pt here for Depo. Pt having pressure with urination and urinary frequency. Pt has nitrites in urine. Pt to be seen. Return in 12 weeks for next shot. [place]

## 2015-08-15 NOTE — Telephone Encounter (Signed)
Have her fill it at a different pharmacy.

## 2015-08-15 NOTE — Telephone Encounter (Addendum)
PT would like for you to send it to [facility] in [place].  I have put the pharmacy in.

## 2015-08-15 NOTE — Telephone Encounter (Signed)
Completed.

## 2015-08-15 NOTE — Patient Instructions (Signed)
Push water Follow up prn Urinary Tract Infection Urinary tract infections (UTIs) can develop anywhere along your urinary tract. Your urinary tract is your body's drainage system for removing wastes and extra water. Your urinary tract includes two kidneys, two ureters, a bladder, and a urethra. Your kidneys are a pair of bean-shaped organs. Each kidney is about the size of your fist. They are located below your ribs, one on each side of your spine. CAUSES Infections are caused by microbes, which are microscopic organisms, including fungi, viruses, and bacteria. These organisms are so small that they can only be seen through a microscope. Bacteria are the microbes that most commonly cause UTIs. SYMPTOMS  Symptoms of UTIs may vary by age and gender of the patient and by the location of the infection. Symptoms in young women typically include a frequent and intense urge to urinate and a painful, burning feeling in the bladder or urethra during urination. Older women and men are more likely to be tired, shaky, and weak and have muscle aches and abdominal pain. A fever may mean the infection is in your kidneys. Other symptoms of a kidney infection include pain in your back or sides below the ribs, nausea, and vomiting. DIAGNOSIS To diagnose a UTI, your caregiver will ask you about your symptoms. Your caregiver will also ask you to provide a urine sample. The urine sample will be tested for bacteria and white blood cells. White blood cells are made by your body to help fight infection. TREATMENT  Typically, UTIs can be treated with medication. Because most UTIs are caused by a bacterial infection, they usually can be treated with the use of antibiotics. The choice of antibiotic and length of treatment depend on your symptoms and the type of bacteria causing your infection. HOME CARE INSTRUCTIONS  If you were prescribed antibiotics, take them exactly as your caregiver instructs you. Finish the medication even  if you feel better after you have only taken some of the medication.  Drink enough water and fluids to keep your urine clear or pale yellow.  Avoid caffeine, tea, and carbonated beverages. They tend to irritate your bladder.  Empty your bladder often. Avoid holding urine for long periods of time.  Empty your bladder before and after sexual intercourse.  After a bowel movement, women should cleanse from front to back. Use each tissue only once. SEEK MEDICAL CARE IF:   You have back pain.  You develop a fever.  Your symptoms do not begin to resolve within 3 days. SEEK IMMEDIATE MEDICAL CARE IF:   You have severe back pain or lower abdominal pain.  You develop chills.  You have nausea or vomiting.  You have continued burning or discomfort with urination. MAKE SURE YOU:   Understand these instructions.  Will watch your condition.  Will get help right away if you are not doing well or get worse.   This information is not intended to replace advice given to you by your health care provider. Make sure you discuss any questions you have with your health care provider.   Document Released: 07/18/2005 Document Revised: 06/29/2015 Document Reviewed: 11/16/2011 Elsevier Interactive Patient Education [employer].

## 2015-08-15 NOTE — Addendum Note (Signed)
Addended by: Orvil Feil on: 08/15/2015 03:57 PM   Modules accepted: Orders

## 2015-08-15 NOTE — Telephone Encounter (Signed)
T/C from [place] at [place]. She said that even though the Xifaxin had been approved by Medicaid, they will loose over $100.00 on it and is there anyway we could try something else for the pt.  She said the larger pharmacies do not have this problem, but the small ones do. It kicks it out of their soft ware.  She said she realizes the pt does not have transportation, but they cannot afford to lose that much money on one medication.  She said she can give the prescription to the pt and let her take it to another pharmacy if we do not want to give her something else to try.  Please advise!

## 2015-08-15 NOTE — Telephone Encounter (Signed)
Pt called to see if medicaid ever approved her medicines. JL said it had been approved and now the patient wants to know if she needs to stop taking the other or continue taking them both. She doesn't remember the name of medicines. Please call her at [phone]

## 2015-08-15 NOTE — Telephone Encounter (Signed)
Pt is aware.  

## 2015-08-15 NOTE — Progress Notes (Signed)
Subjective:     Patient ID: Julia Stanley, female   DOB: 07-17-1986, 29 y.o.   MRN: [id number]  HPI Julia Stanley is a 29 year old white female in complaining of urinary frequency and pressure.She was here for her depo and complained and urine + nitrates, so she was added to the schedule as work in.  Review of Systems   Patient denies any headaches, hearing loss, fatigue, blurred vision, shortness of breath, chest pain, abdominal pain, problems with bowel movements,  or intercourse. No joint pain or mood swings.See HPI for positives. Reviewed past medical,surgical, social and family history. Reviewed medications and allergies.  Objective:   Physical Exam BP 90/66 mmHg  Pulse 62  Ht 5\' 5"  (1.651 m)  Wt 142 lb (64.411 kg)  BMI 23.63 kg/m2Urine trace blood and +nitrates, Skin warm and dry.Pelvic: external genitalia is normal in appearance no lesions, vagina: white discharge without odor,urethra has no lesions or masses noted, cervix:smooth, uterus: normal size, shape and contour, non tender, no masses felt, adnexa: no masses or tenderness noted. Bladder is mildly tender and no masses felt.No CVAT. GC/CHL obtained.     Assessment:     Urinary frequency  UTI    Plan:     Rx macrobid 1 bid x 7 days #14 no refills Rx pyridium 200 mg #10 take 1 tid prn UA C&S sent GC/CHL sent Push water Follow up prn Review handout on UTI

## 2015-08-15 NOTE — Telephone Encounter (Signed)
[place] tracks had approved the medication, but they had the start date and the end date the same and the medication was only approved on the one day. I called [place] tracks and got this changed. It has been approved again with the correct dates. Information has been faxed to the pharmacy.

## 2015-08-16 ENCOUNTER — Ambulatory Visit: Payer: Medicaid Other

## 2015-08-16 ENCOUNTER — Encounter: Payer: Self-pay | Admitting: Adult Health

## 2015-08-16 ENCOUNTER — Telehealth: Payer: Self-pay | Admitting: Gastroenterology

## 2015-08-16 LAB — MICROSCOPIC EXAMINATION
Casts: NONE SEEN /lpf
Epithelial Cells (non renal): NONE SEEN /hpf (ref 0–10)

## 2015-08-16 LAB — GC/CHLAMYDIA PROBE AMP
CHLAMYDIA, DNA PROBE: NEGATIVE
NEISSERIA GONORRHOEAE BY PCR: NEGATIVE

## 2015-08-16 LAB — URINALYSIS, ROUTINE W REFLEX MICROSCOPIC
BILIRUBIN UA: NEGATIVE
GLUCOSE, UA: NEGATIVE
KETONES UA: NEGATIVE
Leukocytes, UA: NEGATIVE
Nitrite, UA: POSITIVE — AB
Protein, UA: NEGATIVE
RBC UA: NEGATIVE
UUROB: 0.2 mg/dL (ref 0.2–1.0)
pH, UA: 6 (ref 5.0–7.5)

## 2015-08-16 NOTE — Telephone Encounter (Signed)
Noted  

## 2015-08-16 NOTE — Telephone Encounter (Signed)
I called pt and she said [facility] only had a few Xifaxin , but gave her enough to get started on and they will give her the remainder next week.  She also had to go see Derrek Monaco, NP for a UTI. She is on antibiotics for 7 days. She said Anderson Malta advised her to not start the Xifaxin until she completes the antibiotic for the UTI. I told her I will let Laban Emperor, NP know.

## 2015-08-16 NOTE — Telephone Encounter (Signed)
PLEASE CALL PATIENT ABOUT HER MEDICATION SENT TO [facility]    [phone] PATIENT STATES HER ABD PAIN IS COMING FROM A UTI

## 2015-08-17 ENCOUNTER — Ambulatory Visit: Payer: Medicaid Other

## 2015-08-17 ENCOUNTER — Telehealth: Payer: Self-pay | Admitting: Adult Health

## 2015-08-17 LAB — URINE CULTURE

## 2015-08-17 MED ORDER — CIPROFLOXACIN HCL 500 MG PO TABS: 500.0000 mg | ORAL_TABLET | Freq: Two times a day (BID) | ORAL | Status: DC

## 2015-08-17 NOTE — Telephone Encounter (Signed)
Pt aware of urine culture will rx cipro 500 mg 1 bid x 5 days finish macobid

## 2015-08-18 NOTE — Telephone Encounter (Signed)
Pt called and said she started the Xifaxan yesterday. Took 2 tablets yesterday. She said Anderson Malta told her she could start it if her stomach was still bothering her, since she only had 2 days of antibiotics left. She broke out in a rash last night on her arms and she called [facility] and they told her to stop it and contact our office.  She wants to know if it is OK to go back on Viberzi until her OV in Nov. Please advise! Routing to Walden Field, NP in [place], NP's absence.

## 2015-08-19 NOTE — Telephone Encounter (Signed)
Stop Xifaxan, continue Viberzi until follow-up. Call if any questions or problems.

## 2015-08-21 ENCOUNTER — Encounter: Payer: Self-pay | Admitting: Adult Health

## 2015-08-22 ENCOUNTER — Other Ambulatory Visit: Payer: Self-pay | Admitting: Adult Health

## 2015-08-22 MED ORDER — FLUCONAZOLE 150 MG PO TABS: 150.0000 mg | ORAL_TABLET | Freq: Once | ORAL | Status: DC

## 2015-08-22 NOTE — Telephone Encounter (Signed)
PT is aware.

## 2015-08-25 ENCOUNTER — Ambulatory Visit: Payer: Medicaid Other | Admitting: Gastroenterology

## 2015-08-29 ENCOUNTER — Ambulatory Visit: Payer: Medicaid Other | Admitting: Gastroenterology

## 2015-09-01 ENCOUNTER — Ambulatory Visit: Payer: Medicaid Other | Admitting: Gastroenterology

## 2015-09-07 ENCOUNTER — Ambulatory Visit (INDEPENDENT_AMBULATORY_CARE_PROVIDER_SITE_OTHER): Payer: Medicaid Other | Admitting: Gastroenterology

## 2015-09-07 ENCOUNTER — Encounter: Payer: Self-pay | Admitting: Gastroenterology

## 2015-09-07 VITALS — BP 97/62 | HR 53 | Temp 97.5°F | Ht 67.0 in | Wt 143.8 lb

## 2015-09-07 DIAGNOSIS — K219 Gastro-esophageal reflux disease without esophagitis: Secondary | ICD-10-CM | POA: Diagnosis not present

## 2015-09-07 DIAGNOSIS — K648 Other hemorrhoids: Secondary | ICD-10-CM

## 2015-09-07 DIAGNOSIS — K589 Irritable bowel syndrome without diarrhea: Secondary | ICD-10-CM | POA: Diagnosis not present
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
## 2015-09-07 NOTE — Assessment & Plan Note (Addendum)
SYMPTOMS NOT IDEALLY CONTROLLED AND MAY BE EXACERBATED BY PSYCHOSOCIAL STRESSORS.  CONTINUE VIBERZI. HOLD SECOND DOSE IF YOU ARE HAVING CONSTIPATION. USE IMODIUM ONE OR TWO A DAY OF YOU ARE HAVING UNCONTROLLED DIARRHEA IN SPITE OF TAKING VIBERZI TWICE A DAY. CONSIDER A PROBIOTIC WHEN YOUR FINANCIAL SITUATION IMPROVED. FOLLOW UP IN 4 MOS.

## 2015-09-07 NOTE — Patient Instructions (Addendum)
CONTINUE VIBERZI. HOLD SECOND DOSE IF YOU ARE HAVING CONSTIPATION.  USE IMODIUM ONE OR TWO A DAY OF YOU ARE HAVING UNCONTROLLED DIARRHEA IN SPITE OF TAKING VIBERZI TWICE A DAY.  CONTINUE DEXILANT.  AVOID FOOD THAT TRIGGERS REFLUX. SEE INFO BELOW.   CONSIDER A PROBIOTIC WHEN YOUR FINANCIAL SITUATION IMPROVED.  FOLLOW UP IN 4 MOS.    Lifestyle and home remedies TO HELP CONTROL HEARTBURN.  You may eliminate or reduce the frequency of heartburn by making the following lifestyle changes:  . Control your weight. Being overweight is a major risk factor for heartburn and GERD. Excess pounds put pressure on your abdomen, pushing up your stomach and causing acid to back up into your esophagus.   . Eat smaller meals. 4 TO 6 MEALS A DAY. This reduces pressure on the lower esophageal sphincter, helping to prevent the valve from opening and acid from washing back into your esophagus.   Dolphus Jenny your belt. Clothes that fit tightly around your waist put pressure on your abdomen and the lower esophageal sphincter.  .  . Eliminate heartburn triggers. Everyone has specific triggers.Common triggers such as fatty or fried foods, spicy food, tomato sauce, carbonated beverages, alcohol, chocolate, mint, garlic, onion, caffeine and nicotine may make heartburn worse.   Marland Kitchen Avoid stooping or bending. Tying your shoes is OK. Bending over for longer periods to weed your garden isn't, especially soon after eating.   . Don't lie down after a meal. Wait at least three to four hours after eating before going to bed, and don't lie down right after eating.   Marland Kitchen PLACE THE HEAD OF YOUR BED ON 6 INCH BLOCKS.  Alternative medicine . Several home remedies exist for treating GERD, but they provide only temporary relief. They include drinking baking soda (sodium bicarbonate) added to water or drinking other fluids such as baking soda mixed with cream of tartar and water. . Although these liquids create temporary relief by  neutralizing, washing away or buffering acids, eventually they aggravate the situation by adding gas and fluid to your stomach, increasing pressure and causing more acid reflux. Further, adding more sodium to your diet may increase your blood pressure and add stress to your heart, and excessive bicarbonate ingestion can alter the acid-base balance in your body.

## 2015-09-07 NOTE — Assessment & Plan Note (Signed)
SYMPTOMS CONTROLLED/RESOLVED WITH DEXILANT.  CONTINUE DEXILANT CONTINUE TO MONITOR SYMPTOMS. AVOID TIGGERS FOLLOW UP IN 4 MOS.

## 2015-09-07 NOTE — Assessment & Plan Note (Signed)
SYMPTOMS CONTROLLED/RESOLVED.  CONTINUE TO MONITOR SYMPTOMS. FOLLOW UP IN 4 MOS.  

## 2015-09-07 NOTE — Progress Notes (Signed)
ON RECALL  °

## 2015-09-07 NOTE — Progress Notes (Signed)
cc'ed to pcp °

## 2015-09-11 ENCOUNTER — Encounter: Payer: Self-pay | Admitting: Adult Health

## 2015-09-12 ENCOUNTER — Other Ambulatory Visit: Payer: Self-pay | Admitting: *Deleted

## 2015-09-12 MED ORDER — RIZATRIPTAN BENZOATE 10 MG PO TABS: 10.0000 mg | ORAL_TABLET | ORAL | Status: DC | PRN

## 2015-09-16 ENCOUNTER — Encounter: Payer: Self-pay | Admitting: Adult Health

## 2015-09-19 ENCOUNTER — Encounter: Payer: Self-pay | Admitting: Adult Health

## 2015-09-20 ENCOUNTER — Encounter: Payer: Self-pay | Admitting: Adult Health

## 2015-09-23 ENCOUNTER — Encounter: Payer: Self-pay | Admitting: Obstetrics and Gynecology

## 2015-09-23 ENCOUNTER — Ambulatory Visit (INDEPENDENT_AMBULATORY_CARE_PROVIDER_SITE_OTHER): Payer: Medicaid Other | Admitting: Obstetrics and Gynecology

## 2015-09-23 VITALS — BP 118/60 | Ht 65.0 in | Wt 147.0 lb

## 2015-09-23 DIAGNOSIS — Z30011 Encounter for initial prescription of contraceptive pills: Secondary | ICD-10-CM | POA: Diagnosis not present

## 2015-09-23 MED ORDER — NORETHIN ACE-ETH ESTRAD-FE 1-20 MG-MCG(24) PO TABS: 1.0000 | ORAL_TABLET | Freq: Every day | ORAL | Status: DC

## 2015-09-23 NOTE — Progress Notes (Signed)
Patient ID: Julia Stanley, female   DOB: Jan 23, 1986, 29 y.o.   MRN: [id number] Pt here today to discuss changing her DEPO to BCP.

## 2015-09-23 NOTE — Progress Notes (Signed)
Patient ID: Mikalia Hem, female   DOB: 1986/09/06, 29 y.o.   MRN: [id number]   [facility] Visit  Patient name: Julia Stanley MRN [id number]  Date of birth: [birth year]  CC & HPI:  Julia Stanley is a 28 y.o. female presenting today for to change her birth control. Pt reports she wishes to change from depo to oral birth control. Pt reports being on depo for one year and her next depo injection is due in 10/2015. Pt denies any other concerns or complaints at this time.   ROS:  10 Systems reviewed and all are negative for acute change except as noted in the HPI.  Pertinent History Reviewed:   Reviewed: Significant for prior hx of using OCP's successful. Medical         Past Medical History  Diagnosis Date  . Anxiety   . Seasonal allergies   . Abnormal Pap smear   . Nicotine addiction 10/27/2013    Dips since age 37  . Vaginal discharge 03/10/2014  . Frequent headaches   . Polyp of colon   . Urinary frequency 08/15/2015  . UTI (lower urinary tract infection) 08/15/2015                              Surgical Hx:    Past Surgical History  Procedure Laterality Date  . None    . Colonoscopy N/A 04/11/2015    SLF: 1. No source for diarrhea/ abdominal pain identified. 2. One large polyp removed. 3. Large internal hemorrhoids. ADENOMA. Next surveillance 2021  . Hemorrhoid banding N/A 04/11/2015    Procedure: HEMORRHOID BANDING;  Surgeon: Danie Binder, MD;  Location: [facility];  Service: Endoscopy;  Laterality: N/A;  . Polyp removed  2016   Medications: Reviewed & Updated - see associated section                       Current outpatient prescriptions:  .  ciprofloxacin (CIPRO) 500 MG tablet, Take 1 tablet (500 mg total) by mouth 2 (two) times daily. (Patient not taking: Reported on 09/07/2015), Disp: 10 tablet, Rfl: 0 .  clonazePAM (KLONOPIN) 1 MG tablet, Take 1 mg by mouth 2 (two) times daily. , Disp: , Rfl:  .  dexlansoprazole (DEXILANT) 60 MG capsule, Take 1 capsule (60  mg total) by mouth daily., Disp: 30 capsule, Rfl: 11 .  Eluxadoline (VIBERZI) 75 MG TABS, Take 1 tablet by mouth 2 (two) times daily., Disp: 60 tablet, Rfl: 3 .  fexofenadine (ALLEGRA) 180 MG tablet, Take 180 mg by mouth daily., Disp: , Rfl:  .  fluconazole (DIFLUCAN) 150 MG tablet, Take 1 tablet (150 mg total) by mouth once., Disp: 1 tablet, Rfl: 1 .  HYDROcodone-acetaminophen (NORCO/VICODIN) 5-325 MG per tablet, Take 1 tablet by mouth every 4 (four) hours as needed for moderate pain. , Disp: , Rfl: 0 .  medroxyPROGESTERone (DEPO-PROVERA) 150 MG/ML injection, Inject 1 mL (150 mg total) into the muscle every 3 (three) months., Disp: 1 mL, Rfl: 3 .  nitrofurantoin, macrocrystal-monohydrate, (MACROBID) 100 MG capsule, Take 1 capsule (100 mg total) by mouth 2 (two) times daily. (Patient not taking: Reported on 09/07/2015), Disp: 14 capsule, Rfl: 0 .  Norethindrone Acetate-Ethinyl Estrad-FE (LOESTRIN 24 FE) 1-20 MG-MCG(24) tablet, Take 1 tablet by mouth daily., Disp: 1 Package, Rfl: 11 .  phenazopyridine (PYRIDIUM) 200 MG tablet, Take 1 tablet (200 mg total) by mouth 3 (three)  times daily as needed for pain., Disp: 10 tablet, Rfl: 0 .  rifaximin (XIFAXAN) 550 MG TABS tablet, Take 1 tablet (550 mg total) by mouth 3 (three) times daily. (Patient not taking: Reported on 09/07/2015), Disp: 42 tablet, Rfl: 0 .  rizatriptan (MAXALT) 10 MG tablet, Take 1 tablet (10 mg total) by mouth as needed for migraine. May repeat in 2 hours if needed, Disp: 10 tablet, Rfl: 1 .  zolpidem (AMBIEN) 10 MG tablet, Take 10 mg by mouth at bedtime as needed for sleep., Disp: , Rfl:    Social History: Reviewed -  reports that she has never smoked. Her smokeless tobacco use includes Snuff.  Objective Findings:  Vitals: Blood pressure 118/60, height 5\' 5"  (1.651 m), weight 147 lb (66.679 kg).  Physical Examination: Presents for discussion only.  Assessment & Plan:   A:  1. Contraception management.   P:  1. Rx for loestrin  fe 24; start loestrin fe 24 on 10/23/2015.  2. OB visit in one year or PRN.      By signing my name below, I, Terressa Koyanagi, attest that this documentation has been prepared under the direction and in the presence of Mallory Shirk, MD. Electronically Signed: Terressa Koyanagi, ED Scribe. 09/23/2015. 12:05 PM.   I personally performed the services described in this documentation, which was SCRIBED in my presence. The recorded information has been reviewed and considered accurate. It has been edited as necessary during review. Jonnie Kind, MD

## 2015-09-24 ENCOUNTER — Encounter: Payer: Self-pay | Admitting: Adult Health

## 2015-09-28 ENCOUNTER — Other Ambulatory Visit: Payer: Self-pay | Admitting: Gastroenterology

## 2015-10-06 ENCOUNTER — Encounter: Payer: Self-pay | Admitting: Adult Health

## 2015-10-19 ENCOUNTER — Telehealth: Payer: Self-pay

## 2015-10-19 NOTE — Telephone Encounter (Signed)
Pt left VM that she has had diarrhea x 3 days and everything she eats or drinks goes straight through her. She said she had decreased the Viberzi to once a day for a couple of weeks as she was instructed to do.  She has started back on taking it bid. I called pt to get more info and [facility] for a return call.

## 2015-10-20 ENCOUNTER — Encounter: Payer: Self-pay | Admitting: Adult Health

## 2015-10-20 NOTE — Telephone Encounter (Signed)
I spoke to pt and she said this started right after Christmas Day. Please advise!

## 2015-10-21 ENCOUNTER — Encounter: Payer: Self-pay | Admitting: Adult Health

## 2015-10-25 NOTE — Telephone Encounter (Signed)
PLEASE CALL PT. SHE IS LIKELY HAVING A IBS FLARE OR SHE HAS A VIRAL ILLNESS. SHE SHOULD DRINK WATER TO KEEP HER URINE LIGHT YELLOW. AVOID DAIRY. TAKE VIBERZI TWICE DAILY.  CALL IN 7 DAYS IF HER SYMPTOMS ARE NOT IMPROVED.

## 2015-10-25 NOTE — Telephone Encounter (Signed)
LMOM to call.

## 2015-10-25 NOTE — Telephone Encounter (Signed)
Forwarding to Walden Field, NP to advise in Dr. Nona Dell absence.

## 2015-10-26 ENCOUNTER — Ambulatory Visit: Payer: Medicaid Other | Admitting: Adult Health

## 2015-10-26 NOTE — Telephone Encounter (Signed)
PT is aware of results.  

## 2015-11-07 ENCOUNTER — Ambulatory Visit: Payer: Medicaid Other

## 2015-11-24 ENCOUNTER — Telehealth: Payer: Self-pay | Admitting: *Deleted

## 2015-11-24 ENCOUNTER — Other Ambulatory Visit: Payer: Self-pay

## 2015-11-24 NOTE — Telephone Encounter (Signed)
Patient called requesting to have hydrocodone refilled. Please advise and notify patient when rx is ready

## 2015-11-25 MED ORDER — ELUXADOLINE 75 MG PO TABS: 1.0000 | ORAL_TABLET | Freq: Two times a day (BID) | ORAL | Status: DC

## 2015-11-25 MED ORDER — HYDROCODONE-ACETAMINOPHEN 5-325 MG PO TABS: 1.0000 | ORAL_TABLET | ORAL | Status: DC | PRN

## 2015-11-25 NOTE — Telephone Encounter (Signed)
Rx printed

## 2015-12-20 ENCOUNTER — Ambulatory Visit (INDEPENDENT_AMBULATORY_CARE_PROVIDER_SITE_OTHER): Payer: Medicare Other | Admitting: Orthopaedic Surgery

## 2015-12-20 VITALS — BP 112/70 | HR 97 | Temp 98.1°F | Ht 65.0 in | Wt 152.4 lb

## 2015-12-20 DIAGNOSIS — M545 Low back pain, unspecified: Secondary | ICD-10-CM

## 2015-12-20 MED ORDER — HYDROCODONE-ACETAMINOPHEN 7.5-325 MG PO TABS: 1.0000 | ORAL_TABLET | ORAL | Status: DC | PRN

## 2015-12-20 NOTE — Progress Notes (Signed)
Patient [id number] [name], female DOB:Jun 22, 1986, 30 y.o. [id number]  Chief Complaint  Patient presents with  . Follow-up    Low back pain    HPI  Julia Stanley is a 30 y.o. female who has chronic lower back pain that is stable.  She has no paresthesias.  She has no trauma and no bowel or bladder problems.  HPI  Body mass index is 25.36 kg/(m^2).   Review of Systems  Constitutional:        Patient does not have Diabetes Mellitus. Patient does not have hypertension. Patient does not have COPD or shortness of breath. Patient does not have BMI > 35. Patient does not have current smoking history. But she dips snuff.  Musculoskeletal: Positive for myalgias and back pain.    Past Medical History  Diagnosis Date  . Anxiety   . Seasonal allergies   . Abnormal Pap smear   . Nicotine addiction 10/27/2013    Dips since age 73  . Vaginal discharge 03/10/2014  . Frequent headaches   . Polyp of colon   . Urinary frequency 08/15/2015  . UTI (lower urinary tract infection) 08/15/2015    Past Surgical History  Procedure Laterality Date  . None    . Colonoscopy N/A 04/11/2015    SLF: 1. No source for diarrhea/ abdominal pain identified. 2. One large polyp removed. 3. Large internal hemorrhoids. ADENOMA. Next surveillance 2021  . Hemorrhoid banding N/A 04/11/2015    Procedure: HEMORRHOID BANDING;  Surgeon: Danie Binder, MD;  Location: [facility];  Service: Endoscopy;  Laterality: N/A;  . Polyp removed  2016    Family History  Problem Relation Age of Onset  . Cancer Father   . Hypertension Maternal Grandfather   . Hyperlipidemia Maternal Grandfather   . Diabetes Maternal Grandfather   . Hypertension Paternal Grandmother   . Cancer Paternal Grandfather   . Colon cancer Neg Hx     Social History Social History  Substance Use Topics  . Smoking status: Never Smoker   . Smokeless tobacco: Current User    Types: Snuff     Comment: "Been dipping since I was 7"  . Alcohol  Use: No    Allergies  Allergen Reactions  . Aspirin Other (See Comments)    Nose bleeds.  . Sulfa Antibiotics Hives    Current Outpatient Prescriptions  Medication Sig Dispense Refill  . clonazePAM (KLONOPIN) 1 MG tablet Take 1 mg by mouth 2 (two) times daily.     . fluconazole (DIFLUCAN) 150 MG tablet Take 1 tablet (150 mg total) by mouth once. 1 tablet 1  . Norethindrone Acetate-Ethinyl Estrad-FE (LOESTRIN 24 FE) 1-20 MG-MCG(24) tablet Take 1 tablet by mouth daily. 1 Package 11  . zolpidem (AMBIEN) 10 MG tablet Take 10 mg by mouth at bedtime as needed for sleep.    Marland Kitchen dexlansoprazole (DEXILANT) 60 MG capsule Take 1 capsule (60 mg total) by mouth daily. (Patient not taking: Reported on 12/20/2015) 30 capsule 11  . Eluxadoline (VIBERZI) 75 MG TABS Take 1 tablet by mouth 2 (two) times daily. (Patient not taking: Reported on 12/20/2015) 60 tablet 3  . fexofenadine (ALLEGRA) 180 MG tablet Take 180 mg by mouth daily. Reported on 12/20/2015    . HYDROcodone-acetaminophen (NORCO) 7.5-325 MG tablet Take 1 tablet by mouth every 4 (four) hours as needed for moderate pain (Must last 30 days.  Do not drive or operate machinery while taking this medicine.). 120 tablet 0  . medroxyPROGESTERone (DEPO-PROVERA) 150 MG/ML injection  Inject 1 mL (150 mg total) into the muscle every 3 (three) months. (Patient not taking: Reported on 12/20/2015) 1 mL 3  . nitrofurantoin, macrocrystal-monohydrate, (MACROBID) 100 MG capsule Take 1 capsule (100 mg total) by mouth 2 (two) times daily. (Patient not taking: Reported on 09/07/2015) 14 capsule 0  . phenazopyridine (PYRIDIUM) 200 MG tablet Take 1 tablet (200 mg total) by mouth 3 (three) times daily as needed for pain. (Patient not taking: Reported on 12/20/2015) 10 tablet 0  . PROCTOSOL HC 2.5 % rectal cream PLACE 1 APPLICATION RECTALLY TWICE A DAY (Patient not taking: Reported on 12/20/2015) 28.35 g 0  . rifaximin (XIFAXAN) 550 MG TABS tablet Take 1 tablet (550 mg total) by  mouth 3 (three) times daily. (Patient not taking: Reported on 12/20/2015) 42 tablet 0  . rizatriptan (MAXALT) 10 MG tablet Take 1 tablet (10 mg total) by mouth as needed for migraine. May repeat in 2 hours if needed (Patient not taking: Reported on 12/20/2015) 10 tablet 1   No current facility-administered medications for this visit.     Physical Exam  Blood pressure 112/70, pulse 97, temperature 98.1 F (36.7 C), height 5\' 5"  (1.651 m), weight 152 lb 6.4 oz (69.128 kg).  Constitutional: overall normal hygiene, normal nutrition, well developed, normal grooming, normal body habitus. Assistive device:none  Musculoskeletal: gait and station Limp none, muscle tone and strength are normal, no tremors or atrophy is present.  .  Neurological: coordination overall normal.  Deep tendon reflex/nerve stretch intact.  Sensation normal.  Cranial nerves II-XII intact.   Skin:   normal overall no scars, lesions, ulcers or rashes. No psoriasis.  Psychiatric: Alert and oriented x 3.  Recent memory intact, remote memory unclear.  Normal mood and affect. Well groomed.  Good eye contact.  Cardiovascular: overall no swelling, no varicosities, no edema bilaterally, normal temperatures of the legs and arms, no clubbing, cyanosis and good capillary refill.  Lymphatic: palpation is normal.  Spine/Pelvis examination:  Inspection:  Overall, sacoiliac joint benign and hips nontender; without crepitus or defects.   Thoracic spine inspection: Alignment normal without kyphosis present   Lumbar spine inspection:  Alignment  with normal lumbar lordosis, without scoliosis apparent.   Thoracic spine palpation:  without tenderness of spinal processes   Lumbar spine palpation: with tenderness of lumbar area; without tightness of lumbar muscles    Range of Motion:   Lumbar flexion, forward flexion is 35  without pain or tenderness    Lumbar extension is 10  without pain or tenderness   Left lateral bend is Normal   without pain or tenderness   Right lateral bend is Normal without pain or tenderness   Straight leg raising is Normal   Strength & tone: Normal   Stability overall normal stability     The patient has been educated about the nature of the problem(s) and counseled on treatment options.  The patient appeared to understand what I have discussed and is in agreement with it.  PLAN Call if any problems.  Precautions discussed.  Continue current medications.   Return to clinic 3 months

## 2015-12-21 ENCOUNTER — Ambulatory Visit: Payer: Medicaid Other | Admitting: Orthopaedic Surgery

## 2015-12-22 ENCOUNTER — Telehealth: Payer: Self-pay | Admitting: Gastroenterology

## 2015-12-22 NOTE — Telephone Encounter (Signed)
I have received the paper from the pharmacy and I am working on a PA for her.

## 2015-12-22 NOTE — Telephone Encounter (Signed)
Pt called about her Viberzi prescription. She said her insurance wasn't going to pay for it and was asking if you had received the PA papers from her pharmacy. I told her the nurse was at lunch and I would send her a message and get back with her. Please call [phone]

## 2015-12-23 ENCOUNTER — Other Ambulatory Visit: Payer: Self-pay | Admitting: *Deleted

## 2015-12-26 MED ORDER — RIZATRIPTAN BENZOATE 10 MG PO TABS
10.0000 mg | ORAL_TABLET | ORAL | Status: DC | PRN
Start: 2015-12-26 — End: 2016-04-04

## 2015-12-27 ENCOUNTER — Encounter: Payer: Self-pay | Admitting: Orthopaedic Surgery

## 2016-01-10 ENCOUNTER — Encounter: Payer: Self-pay | Admitting: Adult Health

## 2016-01-12 ENCOUNTER — Ambulatory Visit: Payer: Medicaid Other | Admitting: Gastroenterology

## 2016-01-17 ENCOUNTER — Telehealth: Payer: Self-pay | Admitting: Orthopaedic Surgery

## 2016-01-17 ENCOUNTER — Encounter: Payer: Self-pay | Admitting: Orthopaedic Surgery

## 2016-01-17 ENCOUNTER — Encounter: Payer: Self-pay | Admitting: Adult Health

## 2016-01-17 NOTE — Telephone Encounter (Signed)
Patient requests refill of her medication, both per phone call and via MyChart message.  Medication: HYDROcodone-acetaminophen (NORCO) 7.5-325 MG tablet [id number] quantity 120.  Please advise.  Patient ph# [phone].

## 2016-01-18 ENCOUNTER — Encounter: Payer: Self-pay | Admitting: Obstetrics and Gynecology

## 2016-01-18 ENCOUNTER — Other Ambulatory Visit: Payer: Self-pay | Admitting: Obstetrics and Gynecology

## 2016-01-18 DIAGNOSIS — Z30011 Encounter for initial prescription of contraceptive pills: Secondary | ICD-10-CM

## 2016-01-18 MED ORDER — LEVONORGEST-ETH ESTRAD 91-DAY 0.15-0.03 &0.01 MG PO TABS: 1.0000 | ORAL_TABLET | Freq: Every day | ORAL | Status: DC

## 2016-01-18 MED ORDER — HYDROCODONE-ACETAMINOPHEN 7.5-325 MG PO TABS: 1.0000 | ORAL_TABLET | ORAL | Status: DC | PRN

## 2016-01-18 NOTE — Telephone Encounter (Signed)
Rx done. 

## 2016-01-24 ENCOUNTER — Other Ambulatory Visit: Payer: Self-pay | Admitting: Gastroenterology

## 2016-02-01 ENCOUNTER — Encounter: Payer: Self-pay | Admitting: Gastroenterology

## 2016-02-01 ENCOUNTER — Ambulatory Visit (INDEPENDENT_AMBULATORY_CARE_PROVIDER_SITE_OTHER): Payer: Medicare Other | Admitting: Gastroenterology

## 2016-02-01 VITALS — BP 126/72 | HR 85 | Temp 98.5°F | Ht 65.0 in | Wt 155.6 lb

## 2016-02-01 DIAGNOSIS — K219 Gastro-esophageal reflux disease without esophagitis: Secondary | ICD-10-CM

## 2016-02-01 DIAGNOSIS — K648 Other hemorrhoids: Secondary | ICD-10-CM | POA: Diagnosis not present

## 2016-02-01 DIAGNOSIS — K589 Irritable bowel syndrome without diarrhea: Secondary | ICD-10-CM | POA: Diagnosis not present

## 2016-02-01 NOTE — Assessment & Plan Note (Signed)
SYMPTOMS FAIRLY WELL CONTROLLED ON VIBERZI 1-2 A DAY.  CONTINUE TO MONITOR SYMPTOMS. DRINK WATER TO KEEP YOUR URINE LIGHT YELLOW. FOLLOW A HIGH FIBER DIET. AVOID ITEMS THAT CAUSE BLOATING & GAS. Continue VIBERZI. FOLLOW UP IN 6 MOS.
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
## 2016-02-01 NOTE — Assessment & Plan Note (Signed)
SYMPTOMS CONTROLLED/RESOLVED.  CONTINUE TO MONITOR SYMPTOMS. CONTINUE DEXILANT FOLLOW UP IN 6 MOS.  

## 2016-02-01 NOTE — Progress Notes (Signed)
CC'ED TO PCP 

## 2016-02-01 NOTE — Progress Notes (Signed)
ON RECALL LIST  °

## 2016-02-01 NOTE — Assessment & Plan Note (Signed)
SYMPTOMS CONTROLLED/RESOLVED.  CONTINUE TO MONITOR SYMPTOMS. 

## 2016-02-01 NOTE — Progress Notes (Signed)
cc'ed to pcp °

## 2016-02-01 NOTE — Patient Instructions (Signed)
DRINK WATER TO KEEP YOUR URINE LIGHT YELLOW.  FOLLOW A HIGH FIBER DIET. AVOID ITEMS THAT CAUSE BLOATING & GAS.  Continue VIBERZI AND DEXILANT.  FOLLOW UP IN 6 MOS.

## 2016-02-15 ENCOUNTER — Encounter: Payer: Self-pay | Admitting: Orthopaedic Surgery

## 2016-02-15 ENCOUNTER — Ambulatory Visit (INDEPENDENT_AMBULATORY_CARE_PROVIDER_SITE_OTHER): Payer: Medicare Other

## 2016-02-15 ENCOUNTER — Ambulatory Visit (INDEPENDENT_AMBULATORY_CARE_PROVIDER_SITE_OTHER): Payer: Medicare Other | Admitting: Orthopaedic Surgery

## 2016-02-15 VITALS — BP 110/71 | HR 90 | Temp 97.9°F | Resp 16 | Ht 65.0 in | Wt 155.0 lb

## 2016-02-15 DIAGNOSIS — M542 Cervicalgia: Secondary | ICD-10-CM

## 2016-02-15 DIAGNOSIS — K219 Gastro-esophageal reflux disease without esophagitis: Secondary | ICD-10-CM

## 2016-02-15 DIAGNOSIS — M5441 Lumbago with sciatica, right side: Secondary | ICD-10-CM | POA: Diagnosis not present

## 2016-02-15 DIAGNOSIS — M25532 Pain in left wrist: Secondary | ICD-10-CM

## 2016-02-15 MED ORDER — HYDROCODONE-ACETAMINOPHEN 7.5-325 MG PO TABS: 1.0000 | ORAL_TABLET | ORAL | Status: DC | PRN

## 2016-02-15 MED ORDER — TIZANIDINE HCL 4 MG PO TABS: ORAL_TABLET | ORAL | Status: DC
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
## 2016-02-15 NOTE — Patient Instructions (Signed)
Begin PT. 

## 2016-02-22 ENCOUNTER — Ambulatory Visit: Payer: Medicare Other | Admitting: Orthopaedic Surgery

## 2016-02-27 ENCOUNTER — Ambulatory Visit (HOSPITAL_COMMUNITY): Payer: Medicare Other | Attending: Orthopaedic Surgery | Admitting: Physical Therapy

## 2016-02-27 DIAGNOSIS — M5441 Lumbago with sciatica, right side: Secondary | ICD-10-CM | POA: Insufficient documentation

## 2016-02-27 DIAGNOSIS — R29898 Other symptoms and signs involving the musculoskeletal system: Secondary | ICD-10-CM | POA: Diagnosis present

## 2016-02-27 DIAGNOSIS — R293 Abnormal posture: Secondary | ICD-10-CM | POA: Diagnosis present

## 2016-02-27 DIAGNOSIS — M6281 Muscle weakness (generalized): Secondary | ICD-10-CM | POA: Insufficient documentation
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
## 2016-02-27 NOTE — Patient Instructions (Addendum)
   Cervical Isometrics  Press your head gently into your fingers (5 lbs pressure) without moving through the motion. A) Flexion  B) Lateral flexion (side bending) each way D) Extension: Using 5 lbs of pressure, press your head into the wall while keeping your chin tucked.    Hold each position for 5 seconds and perform 1 sets of 10 repetitions, twice a day.    SCAPULAR RETRACTIONS  Draw your shoulder blades back and down. It should feel like you are trying to squeeze your shoulder blades together.  Repeat 10 times, twice a day.    2D hip excursions (no rotations) 1x10 each way, twice a day

## 2016-02-29 ENCOUNTER — Encounter: Payer: Self-pay | Admitting: Orthopaedic Surgery

## 2016-02-29 ENCOUNTER — Ambulatory Visit (INDEPENDENT_AMBULATORY_CARE_PROVIDER_SITE_OTHER): Payer: Medicare Other | Admitting: Orthopaedic Surgery

## 2016-02-29 VITALS — BP 123/73 | HR 86 | Temp 97.7°F | Ht 65.0 in | Wt 155.0 lb

## 2016-02-29 DIAGNOSIS — M542 Cervicalgia: Secondary | ICD-10-CM

## 2016-02-29 DIAGNOSIS — K219 Gastro-esophageal reflux disease without esophagitis: Secondary | ICD-10-CM

## 2016-02-29 DIAGNOSIS — M545 Low back pain, unspecified: Secondary | ICD-10-CM

## 2016-02-29 NOTE — Progress Notes (Signed)
Patient Julia Stanley, female DOB:10/30/1985, 30 y.o. [id number]  Chief Complaint  Patient presents with  . Follow-up    Neck and back pain    HPI  Julia Stanley is a 30 y.o. female who is seen in follow-up of neck pain and lower back pain.  She has been to PT once and said she felt worse after the treatment. I told her to talk to the therapist the next visit and discuss frankly how she feels.  They can do exercises that help.  She has neck pain still and lower back pain with no sciatica today.  She has no new trauma.  She is taking her medicine.  She is sleeping better.  HPI  Body mass index is 25.79 kg/(m^2).  Review of Systems  Constitutional:        Patient does not have Diabetes Mellitus. Patient does not have hypertension. Patient does not have COPD or shortness of breath. Patient does not have BMI > 35. Patient does not have current smoking history. But she dips snuff.  HENT: Negative for congestion.   Respiratory: Negative for cough and shortness of breath.   Cardiovascular: Negative for chest pain and leg swelling.  Endocrine: Positive for cold intolerance.  Musculoskeletal: Positive for myalgias and back pain.  Allergic/Immunologic: Positive for environmental allergies.  Psychiatric/Behavioral: The patient is nervous/anxious.     Past Medical History  Diagnosis Date  . Anxiety   . Seasonal allergies   . Abnormal Pap smear   . Nicotine addiction 10/27/2013    Dips since age 77  . Vaginal discharge 03/10/2014  . Frequent headaches   . Polyp of colon   . Urinary frequency 08/15/2015  . UTI (lower urinary tract infection) 08/15/2015    Past Surgical History  Procedure Laterality Date  . None    . Colonoscopy N/A 04/11/2015    SLF: 1. No source for diarrhea/ abdominal pain identified. 2. One large polyp removed. 3. Large internal hemorrhoids. ADENOMA. Next surveillance 2021  . Hemorrhoid banding N/A 04/11/2015    Procedure: HEMORRHOID BANDING;  Surgeon:  Danie Binder, MD;  Location: [facility];  Service: Endoscopy;  Laterality: N/A;  . Polyp removed  2016    Family History  Problem Relation Age of Onset  . Cancer Father   . Hypertension Maternal Grandfather   . Hyperlipidemia Maternal Grandfather   . Diabetes Maternal Grandfather   . Hypertension Paternal Grandmother   . Cancer Paternal Grandfather   . Colon cancer Neg Hx     Social History Social History  Substance Use Topics  . Smoking status: Never Smoker   . Smokeless tobacco: Current User    Types: Snuff     Comment: "Been dipping since I was 7"  . Alcohol Use: No    Allergies  Allergen Reactions  . Aspirin Other (See Comments)    Nose bleeds.  . Sulfa Antibiotics Hives    Current Outpatient Prescriptions  Medication Sig Dispense Refill  . clonazePAM (KLONOPIN) 1 MG tablet Take 1 mg by mouth 2 (two) times daily.     . Eluxadoline (VIBERZI) 75 MG TABS Take 1 tablet by mouth 2 (two) times daily. 60 tablet 3  . HYDROcodone-acetaminophen (NORCO) 7.5-325 MG tablet Take 1 tablet by mouth every 4 (four) hours as needed for moderate pain (Must last 30 days.  Do not drive or operate machinery while taking this medicine.). 120 tablet 0  . Levonorgestrel-Ethinyl Estradiol (AMETHIA,CAMRESE) 0.15-0.03 &0.01 MG tablet Take 1 tablet by mouth  daily. 1 Package 4  . rizatriptan (MAXALT) 10 MG tablet Take 1 tablet (10 mg total) by mouth as needed for migraine. May repeat in 2 hours if needed 10 tablet 1  . tiZANidine (ZANAFLEX) 4 MG tablet One by mouth every 8 hours as needed for spasm 90 tablet 3  . zolpidem (AMBIEN) 10 MG tablet Take 10 mg by mouth at bedtime as needed for sleep.     No current facility-administered medications for this visit.     Physical Exam  Blood pressure 123/73, pulse 86, temperature 97.7 F (36.5 C), height 5\' 5"  (1.651 m), weight 155 lb (70.308 kg).  Constitutional: overall normal hygiene, normal nutrition, well developed, normal grooming, normal  body habitus. Assistive device:none  Musculoskeletal: gait and station Limp none, muscle tone and strength are normal, no tremors or atrophy is present.  .  Neurological: coordination overall normal.  Deep tendon reflex/nerve stretch intact.  Sensation normal.  Cranial nerves II-XII intact.   Skin:   normal overall no scars, lesions, ulcers or rashes. No psoriasis.  Psychiatric: Alert and oriented x 3.  Recent memory intact, remote memory unclear.  Normal mood and affect. Well groomed.  Good eye contact.  Cardiovascular: overall no swelling, no varicosities, no edema bilaterally, normal temperatures of the legs and arms, no clubbing, cyanosis and good capillary refill.  Lymphatic: palpation is normal.  Spine/Pelvis examination:  Inspection:  Overall, sacoiliac joint benign and hips nontender; without crepitus or defects.   Thoracic spine inspection: Alignment normal without kyphosis present   Lumbar spine inspection:  Alignment  with normal lumbar lordosis, without scoliosis apparent.   Thoracic spine palpation:  without tenderness of spinal processes   Lumbar spine palpation: with tenderness of lumbar area; without tightness of lumbar muscles    Range of Motion:   Lumbar flexion, forward flexion is 35  with pain or tenderness    Lumbar extension is 10  without pain or tenderness   Left lateral bend is Normal  without pain or tenderness   Right lateral bend is Normal without pain or tenderness   Straight leg raising is Normal   Strength & tone: Normal   Stability overall normal stability    Neck is tender but has good motion.  NV intact.  Grips normal.  The patient has been educated about the nature of the problem(s) and counseled on treatment options.  The patient appeared to understand what I have discussed and is in agreement with it.  Encounter Diagnoses  Name Primary?  . Neck pain Yes  . Midline low back pain without sciatica   . Gastroesophageal reflux disease  without esophagitis     PLAN Call if any problems.  Precautions discussed.  Continue current medications.   Return to clinic 2 weeks  Continue PT.

## 2016-03-02 ENCOUNTER — Ambulatory Visit (HOSPITAL_COMMUNITY): Payer: Medicare Other

## 2016-03-02 DIAGNOSIS — M6281 Muscle weakness (generalized): Secondary | ICD-10-CM

## 2016-03-02 DIAGNOSIS — R293 Abnormal posture: Secondary | ICD-10-CM

## 2016-03-02 DIAGNOSIS — M5441 Lumbago with sciatica, right side: Secondary | ICD-10-CM | POA: Diagnosis not present

## 2016-03-02 DIAGNOSIS — R29898 Other symptoms and signs involving the musculoskeletal system: Secondary | ICD-10-CM

## 2016-03-02 NOTE — Therapy (Signed)
[facility] [address] [place], [place], [address] Phone: [phone]   Fax:  [phone]  Physical Therapy Treatment  Patient Details  Name: Julia Stanley MRN: [id number] Date of Birth: 12/25/1985 Referring Provider: Sanjuana Kava   Encounter Date: 03/02/2016      PT End of Session - 03/02/16 0954    Visit Number 2   Number of Visits 16   Date for PT Re-Evaluation 03/26/16   Authorization Type Medicare/Medicaid    Authorization Time Period 02/27/16 to 04/28/16   Authorization - Visit Number 2   Authorization - Number of Visits 10   PT Start Time 0948   PT Stop Time 1029   PT Time Calculation (min) 41 min   Activity Tolerance Patient tolerated treatment well   Behavior During Therapy [facility] for tasks assessed/performed      Past Medical History  Diagnosis Date  . Anxiety   . Seasonal allergies   . Abnormal Pap smear   . Nicotine addiction 10/27/2013    Dips since age 24  . Vaginal discharge 03/10/2014  . Frequent headaches   . Polyp of colon   . Urinary frequency 08/15/2015  . UTI (lower urinary tract infection) 08/15/2015    Past Surgical History  Procedure Laterality Date  . None    . Colonoscopy N/A 04/11/2015    SLF: 1. No source for diarrhea/ abdominal pain identified. 2. One large polyp removed. 3. Large internal hemorrhoids. ADENOMA. Next surveillance 2021  . Hemorrhoid banding N/A 04/11/2015    Procedure: HEMORRHOID BANDING;  Surgeon: Danie Binder, MD;  [facility];  Service: Endoscopy;  Laterality: N/A;  . Polyp removed  2016    There were no vitals filed for this visit.      Subjective Assessment - 03/02/16 0911    Subjective Pt stated neck and lower back pain scale 6/10.   Pertinent History IBS, nicotine addiction, anxiety, surgical history unremarkable    Patient Stated Goals get stronger    Currently in Pain? Yes   Pain Score 6    Pain Location Back   Pain Orientation Upper;Lower;Right   Pain Descriptors / Indicators Sharp   Pain Type Acute pain   Pain Radiating Towards down R lateral leg ending at knee   Pain Onset 1 to 4 weeks ago   Pain Frequency Constant   Aggravating Factors  sitting up too long   Pain Relieving Factors laying down   Effect of Pain on Daily Activities cannot return to prior level of function           [facility] Adult PT Treatment/Exercise - 03/02/16 0001    Exercises   Exercises Lumbar   Lumbar Exercises: Seated   Other Seated Lumbar Exercises Cervical retraction- unable to perform initially in seated, completed in supine this session.   Other Seated Lumbar Exercises Scapular retraction 10x   Lumbar Exercises: Supine   Ab Set 10 reps;5 seconds   AB Set Limitations Cueing for TrA activation   Bent Knee Raise 10 reps;5 seconds   Bridge 10 reps   Other Supine Lumbar Exercises Cervical and scapular retraction with verbal and tactile cueing            PT Education - 03/02/16 1023    Education provided Yes   Education Details Reviewed goals, instructed proper technique with HEP, and copy of eval given to pt.  Educated importance of proper posture with towel roll for lumbar support.  Person(s) Educated Patient   Methods Explanation;Demonstration;Handout   Comprehension Verbalized understanding;Returned demonstration;Verbal cues required;Need further instruction;Tactile cues required          PT Short Term Goals - 02/27/16 1221    PT SHORT TERM GOAL #1   Title Patient to be able to maintain correct posture at least 70% of the time in order to improve general function and to reduce pain in general    Time 4   Period Weeks   Status New   PT SHORT TERM GOAL #2   Title Patient to demonstrate at least a 10 degree increase in cervical ROM on all planes in order to assist in reducing pain    Time 4   Period Weeks   Status New   PT SHORT TERM GOAL #3   Title Patient to demonstrate at least a 10 degree increase in lumbar flexion and  extension and will be able to laterally flex to midline of knee jiont bilaterally in order to reduce overall pain    Time 4   Period Weeks   Status New   PT SHORT TERM GOAL #4   Title Patient to experience no more than 4/10 pain in her cervical and lumbar regions in order to improve general function and task performance    Time 4   Period Weeks   Status New   PT SHORT TERM GOAL #5   Title Patient to be independent in correctly and consistently performing appropriate HEP, to be updated PRN    Time 1   Period Weeks   Status New           PT Long Term Goals - 02/27/16 1225    PT LONG TERM GOAL #1   Title Patient to demonstrate strength at least 4+/5 in order to assist in reducing pain and improving overall function    Time 8   Period Weeks   Status New   PT LONG TERM GOAL #2   Title Patient to demonstrate cervical and lumbar ROM WFL on all planes in order to reduce pain and improve overall functional task performance    Time 8   Period Weeks   Status New   PT LONG TERM GOAL #3   Title Patient to demonstrate correct proper lifting form in order to assist in preventing exacerbation of pain and symtpoms    Time 8   Period Weeks   Status New   PT LONG TERM GOAL #4   Title Patient to be participatory in regular walking program, at least 30 mintues, at least 4 days per week, in order to maintain functional gains and promote improved health status    Time 8   Period Weeks   Status New   PT LONG TERM GOAL #5   Title Patient to experience no more than 2/10 pain in her cervical or lumbar regions in order to enhance function and task performance skills    Time 8   Period Weeks   Status New               Plan - 03/02/16 1003    Clinical Impression Statement Reviewed goals, compliance and instructed correct form and technqiue with HEP and copy of eval given to pt.  Session focus on education of importance of proper posture to reduce stress on spinal musculature for pain  control.  Instructed towel roll for lumbar support to improve posture.  Therex focus on improving core activation, proximal musculature strengthening and postural education.  Moderate therapist facilitation required to improve forn and activation with exercises.  Pt unable to perform cervical and scapular retraction initially in seated position, able to perform in supine with tactile and verbal cueing for form, end of session able to complete with improved form.  Pt reports pain reduced to 5/10 at end of session.   Rehab Potential Good   PT Frequency 2x / week   PT Duration 8 weeks   PT Treatment/Interventions ADLs/Self Care Home Management;Cryotherapy;Moist Heat;Functional mobility training;Therapeutic activities;Therapeutic exercise;Balance training;Neuromuscular re-education;Patient/family education;Manual techniques;Passive range of motion;Taping   PT Next Visit Plan Continue with current PT POC postural training and strengthening, manual PRN, mobility of neck and lumbar spine       Patient will benefit from skilled therapeutic intervention in order to improve the following deficits and impairments:  Hypomobility, Decreased strength, Pain, Increased muscle spasms, Decreased range of motion, Improper body mechanics, Decreased coordination, Postural dysfunction  Visit Diagnosis: Midline low back pain with right-sided sciatica  Other symptoms and signs involving the musculoskeletal system  Muscle weakness (generalized)  Abnormal posture     Problem List Patient Active Problem List   Diagnosis Date Noted  . Urinary frequency 08/15/2015  . UTI (lower urinary tract infection) 08/15/2015  . Hemorrhoids, internal, with bleeding   . Frequent headaches 03/01/2015  . GERD (gastroesophageal reflux disease) 02/02/2015  . IBS (irritable bowel syndrome) 11/30/2014  . Vaginal discharge 03/10/2014  . Routine gynecological examination 02/08/2014  . Contraception management 02/08/2014  . Nicotine  addiction 10/27/2013  . Abnormal pap 01/23/2013   Ihor Austin, LPTA; [place]   Aldona Lento 03/02/2016, 10:40 AM  [facility], [place], [address] Phone: [phone]   Fax:  [phone]  Name: Darnasia Gardon MRN: [id number] Date of Birth: November 14, 1985

## 2016-03-05 ENCOUNTER — Encounter (HOSPITAL_COMMUNITY): Payer: Medicare Other | Admitting: Physical Therapy

## 2016-03-06 ENCOUNTER — Telehealth: Payer: Self-pay

## 2016-03-06 NOTE — Telephone Encounter (Signed)
Request from [facility] for [place] for pt. Please advise and I can fax to them if prescription approved.

## 2016-03-07 ENCOUNTER — Encounter (HOSPITAL_COMMUNITY): Payer: Medicare Other | Admitting: Physical Therapy

## 2016-03-07 NOTE — Telephone Encounter (Signed)
Previous visit in April recommended cont'd [facility]. Surgical history reviewed and no notation of previous cholecystectomy. Ok to fax refill.

## 2016-03-08 MED ORDER — ELUXADOLINE 75 MG PO TABS: 1.0000 | ORAL_TABLET | Freq: Two times a day (BID) | ORAL | Status: DC

## 2016-03-08 NOTE — Telephone Encounter (Signed)
Rx faxed to the pharmacy.

## 2016-03-09 ENCOUNTER — Ambulatory Visit (HOSPITAL_COMMUNITY): Payer: Medicare Other

## 2016-03-09 DIAGNOSIS — R293 Abnormal posture: Secondary | ICD-10-CM

## 2016-03-09 DIAGNOSIS — M5441 Lumbago with sciatica, right side: Secondary | ICD-10-CM

## 2016-03-09 DIAGNOSIS — R29898 Other symptoms and signs involving the musculoskeletal system: Secondary | ICD-10-CM

## 2016-03-09 DIAGNOSIS — M6281 Muscle weakness (generalized): Secondary | ICD-10-CM

## 2016-03-09 NOTE — Therapy (Signed)
[facility] [address] [place], [place], [address] Phone: [phone]   Fax:  [phone]  Physical Therapy Treatment  Patient Details  Name: Julia Stanley MRN: [id number] Date of Birth: 1986-04-29 Referring Provider: Sanjuana Kava   Encounter Date: 03/09/2016      PT End of Session - 03/09/16 0952    Visit Number 3   Number of Visits 16   Date for PT Re-Evaluation 03/26/16   Authorization Type Medicare/Medicaid    Authorization Time Period 02/27/16 to 04/28/16   Authorization - Visit Number 3   Authorization - Number of Visits 10   PT Start Time 0948   PT Stop Time 1026   PT Time Calculation (min) 38 min   Activity Tolerance Patient tolerated treatment well   Behavior During Therapy [facility] for tasks assessed/performed      Past Medical History  Diagnosis Date  . Anxiety   . Seasonal allergies   . Abnormal Pap smear   . Nicotine addiction 10/27/2013    Dips since age 34  . Vaginal discharge 03/10/2014  . Frequent headaches   . Polyp of colon   . Urinary frequency 08/15/2015  . UTI (lower urinary tract infection) 08/15/2015    Past Surgical History  Procedure Laterality Date  . None    . Colonoscopy N/A 04/11/2015    SLF: 1. No source for diarrhea/ abdominal pain identified. 2. One large polyp removed. 3. Large internal hemorrhoids. ADENOMA. Next surveillance 2021  . Hemorrhoid banding N/A 04/11/2015    Procedure: HEMORRHOID BANDING;  Surgeon: Danie Binder, MD;  [facility];  Service: Endoscopy;  Laterality: N/A;  . Polyp removed  2016    There were no vitals filed for this visit.      Subjective Assessment - 03/09/16 0951    Subjective Pt stated she is feeling good today, reports compliance with HEP daily.  Reports increased mobility wtih neck wtih no pain, able to bend and rotate further   Pertinent History IBS, nicotine addiction, anxiety, surgical history unremarkable    Patient Stated Goals get stronger     Currently in Pain? No/denies                [facility] Adult PT Treatment/Exercise - 03/09/16 0001    Bed Mobility   Bed Mobility Sit to Sidelying Right;Sit to Sidelying Left  R   Sit to Sidelying Right 5: Supervision  Reviewed proper log roll technique   Sit to Sidelying Right Details (indicate cue type and reason) Reviewed proper log roll technique   Sit to Sidelying Left 5: Supervision  Reviewed proper log roll technique   Sit to Sidelying Left Details (indicate cue type and reason) Reviewed proper log roll technique   Lumbar Exercises: Standing   Functional Squats 10 reps   Functional Squats Limitations tactile cueing and demonstration for proper form   Lumbar Exercises: Seated   Other Seated Lumbar Exercises Cervical retraction with verbal and tactile cueing   Other Seated Lumbar Exercises Scapular retraction 10x   Lumbar Exercises: Supine   Ab Set 10 reps;5 seconds   AB Set Limitations Cueing for TrA activation   Bent Knee Raise 10 reps;5 seconds   Bent Knee Raise Limitations Dead bug   Bridge 15 reps   Straight Leg Raise 10 reps   Straight Leg Raises Limitations UE press down for core bracing   Other Supine Lumbar Exercises Cervical and scapular retraction with verbal and tactile cueing  Lumbar Exercises: Sidelying   Hip Abduction 10 reps   Hip Abduction Limitations cueing for form   Lumbar Exercises: Prone   Straight Leg Raise 10 reps                  PT Short Term Goals - 02/27/16 1221    PT SHORT TERM GOAL #1   Title Patient to be able to maintain correct posture at least 70% of the time in order to improve general function and to reduce pain in general    Time 4   Period Weeks   Status New   PT SHORT TERM GOAL #2   Title Patient to demonstrate at least a 10 degree increase in cervical ROM on all planes in order to assist in reducing pain    Time 4   Period Weeks   Status New   PT SHORT TERM GOAL #3   Title Patient to demonstrate at least a  10 degree increase in lumbar flexion and extension and will be able to laterally flex to midline of knee jiont bilaterally in order to reduce overall pain    Time 4   Period Weeks   Status New   PT SHORT TERM GOAL #4   Title Patient to experience no more than 4/10 pain in her cervical and lumbar regions in order to improve general function and task performance    Time 4   Period Weeks   Status New   PT SHORT TERM GOAL #5   Title Patient to be independent in correctly and consistently performing appropriate HEP, to be updated PRN    Time 1   Period Weeks   Status New           PT Long Term Goals - 02/27/16 1225    PT LONG TERM GOAL #1   Title Patient to demonstrate strength at least 4+/5 in order to assist in reducing pain and improving overall function    Time 8   Period Weeks   Status New   PT LONG TERM GOAL #2   Title Patient to demonstrate cervical and lumbar ROM WFL on all planes in order to reduce pain and improve overall functional task performance    Time 8   Period Weeks   Status New   PT LONG TERM GOAL #3   Title Patient to demonstrate correct proper lifting form in order to assist in preventing exacerbation of pain and symtpoms    Time 8   Period Weeks   Status New   PT LONG TERM GOAL #4   Title Patient to be participatory in regular walking program, at least 30 mintues, at least 4 days per week, in order to maintain functional gains and promote improved health status    Time 8   Period Weeks   Status New   PT LONG TERM GOAL #5   Title Patient to experience no more than 2/10 pain in her cervical or lumbar regions in order to enhance function and task performance skills    Time 8   Period Weeks   Status New               Plan - 03/09/16 1023    Clinical Impression Statement Session focus on posture awareness and core/proximal musculature strengthening.  Reviewed form and technqiue with posture strengthening HEP, improving form though does require  cueing for technqiue with cervical retraction.  Progressed hip strengthening exercises with sidelying and prone SLR as well as squats  with cueing for form and technique.  No reports of pain through session.     Rehab Potential Good   PT Frequency 2x / week   PT Duration 8 weeks   PT Treatment/Interventions ADLs/Self Care Home Management;Cryotherapy;Moist Heat;Functional mobility training;Therapeutic activities;Therapeutic exercise;Balance training;Neuromuscular re-education;Patient/family education;Manual techniques;Passive range of motion;Taping   PT Next Visit Plan Continue with current PT POC postural training and strengthening, manual PRN, mobility of neck and lumbar spine  Begin theraband posture strengthening next session.     PT Home Exercise Plan reviewed, no update      Patient will benefit from skilled therapeutic intervention in order to improve the following deficits and impairments:  Hypomobility, Decreased strength, Pain, Increased muscle spasms, Decreased range of motion, Improper body mechanics, Decreased coordination, Postural dysfunction  Visit Diagnosis: Midline low back pain with right-sided sciatica  Other symptoms and signs involving the musculoskeletal system  Muscle weakness (generalized)  Abnormal posture     Problem List Patient Active Problem List   Diagnosis Date Noted  . Urinary frequency 08/15/2015  . UTI (lower urinary tract infection) 08/15/2015  . Hemorrhoids, internal, with bleeding   . Frequent headaches 03/01/2015  . GERD (gastroesophageal reflux disease) 02/02/2015  . IBS (irritable bowel syndrome) 11/30/2014  . Vaginal discharge 03/10/2014  . Routine gynecological examination 02/08/2014  . Contraception management 02/08/2014  . Nicotine addiction 10/27/2013  . Abnormal pap 01/23/2013   Ihor Austin, LPTA; [place]  Aldona Lento 03/09/2016, 10:34 AM  [facility] [place], [place], [address] Phone: [phone]   Fax:  [phone]  Name: Sabrine Kitzman MRN: [id number] Date of Birth: 1986/03/26

## 2016-03-12 ENCOUNTER — Ambulatory Visit (HOSPITAL_COMMUNITY): Payer: Medicare Other | Admitting: Physical Therapy

## 2016-03-12 ENCOUNTER — Telehealth: Payer: Self-pay | Admitting: Orthopaedic Surgery

## 2016-03-12 DIAGNOSIS — R29898 Other symptoms and signs involving the musculoskeletal system: Secondary | ICD-10-CM

## 2016-03-12 DIAGNOSIS — R293 Abnormal posture: Secondary | ICD-10-CM

## 2016-03-12 DIAGNOSIS — M6281 Muscle weakness (generalized): Secondary | ICD-10-CM

## 2016-03-12 DIAGNOSIS — M5441 Lumbago with sciatica, right side: Secondary | ICD-10-CM

## 2016-03-12 MED ORDER — HYDROCODONE-ACETAMINOPHEN 7.5-325 MG PO TABS: 1.0000 | ORAL_TABLET | ORAL | Status: DC | PRN

## 2016-03-12 NOTE — Therapy (Signed)
[facility] [address] [place], [place], [address] Phone: [phone]   Fax:  [phone]  Physical Therapy Treatment  Patient Details  Name: Julia Stanley MRN: [id number] Date of Birth: Feb 19, 1986 Referring Provider: Sanjuana Kava   Encounter Date: 03/12/2016      PT End of Session - 03/12/16 1112    Visit Number 4   Number of Visits 16   Date for PT Re-Evaluation 03/26/16   Authorization Type Medicare/Medicaid    Authorization Time Period 02/27/16 to 04/28/16   Authorization - Visit Number 4   Authorization - Number of Visits 10   PT Start Time 8638   PT Stop Time 1111   PT Time Calculation (min) 39 min   Activity Tolerance Patient tolerated treatment well   Behavior During Therapy [facility] for tasks assessed/performed      Past Medical History  Diagnosis Date  . Anxiety   . Seasonal allergies   . Abnormal Pap smear   . Nicotine addiction 10/27/2013    Dips since age 71  . Vaginal discharge 03/10/2014  . Frequent headaches   . Polyp of colon   . Urinary frequency 08/15/2015  . UTI (lower urinary tract infection) 08/15/2015    Past Surgical History  Procedure Laterality Date  . None    . Colonoscopy N/A 04/11/2015    SLF: 1. No source for diarrhea/ abdominal pain identified. 2. One large polyp removed. 3. Large internal hemorrhoids. ADENOMA. Next surveillance 2021  . Hemorrhoid banding N/A 04/11/2015    Procedure: HEMORRHOID BANDING;  Surgeon: Danie Binder, MD;  [facility];  Service: Endoscopy;  Laterality: N/A;  . Polyp removed  2016    There were no vitals filed for this visit.      Subjective Assessment - 03/12/16 1033    Subjective Pt states she is feeling pretty good. She has been doing her exercises without any issues. She states she can move her neck in just about every direction without issues. She has some pain in her back but thinks that is from her scoliosis. Other than that, she has no conerns.    Pertinent History IBS, nicotine addiction, anxiety, surgical history unremarkable    Patient Stated Goals get stronger                          [facility] Adult PT Treatment/Exercise - 03/12/16 0001    Self-Care   Self-Care Lifting   Lifting Lifting multiple boxes atleast 10-15#    Exercises   Exercises Shoulder   Lumbar Exercises: Standing   Functional Squats 10 reps   Functional Squats Limitations minimal cues   Lumbar Exercises: Seated   Other Seated Lumbar Exercises chin tucks x10   Other Seated Lumbar Exercises rows with green TB x10   Lumbar Exercises: Supine   Bridge 5 reps   Bridge Limitations hold with alt knee extension   Lumbar Exercises: Quadruped   Single Arm Raise Right;Left;10 reps   Straight Leg Raise 10 reps                  PT Short Term Goals - 03/12/16 1037    PT SHORT TERM GOAL #1   Title Patient to be able to maintain correct posture at least 70% of the time in order to improve general function and to reduce pain in general    Baseline 50%   Time 4   Period Weeks  Status Partially Met   PT SHORT TERM GOAL #2   Title Patient to demonstrate at least a 10 degree increase in cervical ROM on all planes in order to assist in reducing pain    Time 4   Period Weeks   Status Achieved   PT SHORT TERM GOAL #3   Title Patient to demonstrate at least a 10 degree increase in lumbar flexion and extension and will be able to laterally flex to midline of knee jiont bilaterally in order to reduce overall pain    Time 4   Period Weeks   Status Achieved   PT SHORT TERM GOAL #4   Title Patient to experience no more than 4/10 pain in her cervical and lumbar regions in order to improve general function and task performance    Time 4   Period Weeks   Status Partially Met   PT SHORT TERM GOAL #5   Title Patient to be independent in correctly and consistently performing appropriate HEP, to be updated PRN    Time 1   Period Weeks   Status Achieved            PT Long Term Goals - 03/12/16 1108    PT LONG TERM GOAL #1   Title Patient to demonstrate strength at least 4+/5 in order to assist in reducing pain and improving overall function    Time 8   Period Weeks   Status New   PT LONG TERM GOAL #2   Title Patient to demonstrate cervical and lumbar ROM WFL on all planes in order to reduce pain and improve overall functional task performance    Time 8   Period Weeks   Status Achieved   PT LONG TERM GOAL #3   Title Patient to demonstrate correct proper lifting form in order to assist in preventing exacerbation of pain and symtpoms    Baseline poor technique with placing box back onto floor   Time 8   Period Weeks   Status Partially Met   PT LONG TERM GOAL #4   Title Patient to be participatory in regular walking program, at least 30 mintues, at least 4 days per week, in order to maintain functional gains and promote improved health status    Baseline starting to walk 1-1.5 miles a day (9mn)   Time 8   Period Weeks   Status Partially Met   PT LONG TERM GOAL #5   Title Patient to experience no more than 2/10 pain in her cervical or lumbar regions in order to enhance function and task performance skills    Time 8   Period Weeks   Status Partially Met               Plan - 03/12/16 1112    Clinical Impression Statement Today's session focused on trunk stabilization, postural strengthening and review of lifting mechanics. Pt has made significant improvement since beginning therapy a couple of weeks ago. She currently has full, pain free cervical and lumbar ROM and is walking more at home for improved fitness. Therapist noting she demonstrates correct technique when lifting box from the floor, however she is unable to correctly place the box back on the floor without noted lumbar flexion. This improved some with therapist cues. Discussed goals and progress with pt so far and she is in agreement with possible d/c next session  assuming no issues/concerns arise.   Rehab Potential Good   PT Frequency 2x / week   PT  Duration 8 weeks   PT Treatment/Interventions ADLs/Self Care Home Management;Cryotherapy;Moist Heat;Functional mobility training;Therapeutic activities;Therapeutic exercise;Balance training;Neuromuscular re-education;Patient/family education;Manual techniques;Passive range of motion;Taping   PT Next Visit Plan Continue with current PT POC postural training and strengthening, manual PRN, mobility of neck and lumbar spine  Begin theraband posture strengthening next session.     PT Home Exercise Plan updated with    Consulted and Agree with Plan of Care Patient      Patient will benefit from skilled therapeutic intervention in order to improve the following deficits and impairments:  Hypomobility, Decreased strength, Pain, Increased muscle spasms, Decreased range of motion, Improper body mechanics, Decreased coordination, Postural dysfunction  Visit Diagnosis: Midline low back pain with right-sided sciatica  Other symptoms and signs involving the musculoskeletal system  Muscle weakness (generalized)  Abnormal posture     Problem List Patient Active Problem List   Diagnosis Date Noted  . Urinary frequency 08/15/2015  . UTI (lower urinary tract infection) 08/15/2015  . Hemorrhoids, internal, with bleeding   . Frequent headaches 03/01/2015  . GERD (gastroesophageal reflux disease) 02/02/2015  . IBS (irritable bowel syndrome) 11/30/2014  . Vaginal discharge 03/10/2014  . Routine gynecological examination 02/08/2014  . Contraception management 02/08/2014  . Nicotine addiction 10/27/2013  . Abnormal pap 01/23/2013    12:59 PM,03/12/2016 Elly Modena PT, DPT [facility] [address] [place], [place], [address] Phone: [phone]   Fax:  [phone]  Name: Julia Stanley MRN: [id number] Date  of Birth: 08-21-1986

## 2016-03-12 NOTE — Telephone Encounter (Signed)
Rx done. 

## 2016-03-14 ENCOUNTER — Encounter: Payer: Self-pay | Admitting: Orthopaedic Surgery

## 2016-03-14 ENCOUNTER — Encounter (HOSPITAL_COMMUNITY): Payer: Medicare Other | Admitting: Physical Therapy

## 2016-03-14 ENCOUNTER — Ambulatory Visit (INDEPENDENT_AMBULATORY_CARE_PROVIDER_SITE_OTHER): Payer: Medicare Other | Admitting: Orthopaedic Surgery

## 2016-03-14 VITALS — BP 113/79 | HR 96 | Temp 98.1°F | Ht 65.0 in | Wt 155.0 lb

## 2016-03-14 DIAGNOSIS — M542 Cervicalgia: Secondary | ICD-10-CM | POA: Diagnosis not present

## 2016-03-14 DIAGNOSIS — M545 Low back pain, unspecified: Secondary | ICD-10-CM

## 2016-03-14 DIAGNOSIS — K219 Gastro-esophageal reflux disease without esophagitis: Secondary | ICD-10-CM | POA: Diagnosis not present

## 2016-03-14 NOTE — Progress Notes (Signed)
Patient Julia Stanley, female DOB:1986/01/31, 30 y.o. DH:[id number]  Chief Complaint  Patient presents with  . Follow-up    Neck and back pain    HPI  Julia Stanley is a 30 y.o. female who has lower back and neck pain.  She has been going to PT and has been doing well.  She is to be released this week from therapy.  She knows her exercises to do at home and is doing them.  She has no paresthesias, no new trauma.  She is pleased with her progress.  HPI  Body mass index is 25.79 kg/(m^2).  ROS  Review of Systems  Constitutional:        Patient does not have Diabetes Mellitus. Patient does not have hypertension. Patient does not have COPD or shortness of breath. Patient does not have BMI > 35. Patient does not have current smoking history. But she dips snuff.  HENT: Negative for congestion.   Respiratory: Negative for cough and shortness of breath.   Cardiovascular: Negative for chest pain and leg swelling.  Endocrine: Positive for cold intolerance.  Musculoskeletal: Positive for myalgias and back pain.  Allergic/Immunologic: Positive for environmental allergies.  Psychiatric/Behavioral: The patient is nervous/anxious.     Past Medical History  Diagnosis Date  . Anxiety   . Seasonal allergies   . Abnormal Pap smear   . Nicotine addiction 10/27/2013    Dips since age 39  . Vaginal discharge 03/10/2014  . Frequent headaches   . Polyp of colon   . Urinary frequency 08/15/2015  . UTI (lower urinary tract infection) 08/15/2015    Past Surgical History  Procedure Laterality Date  . None    . Colonoscopy N/A 04/11/2015    SLF: 1. No source for diarrhea/ abdominal pain identified. 2. One large polyp removed. 3. Large internal hemorrhoids. ADENOMA. Next surveillance 2021  . Hemorrhoid banding N/A 04/11/2015    Procedure: HEMORRHOID BANDING;  Surgeon: Danie Binder, MD;  [facility];  Service: Endoscopy;  Laterality: N/A;  . Polyp removed  2016    Family  History  Problem Relation Age of Onset  . Cancer Father   . Hypertension Maternal Grandfather   . Hyperlipidemia Maternal Grandfather   . Diabetes Maternal Grandfather   . Hypertension Paternal Grandmother   . Cancer Paternal Grandfather   . Colon cancer Neg Hx     Social History Social History  Substance Use Topics  . Smoking status: Never Smoker   . Smokeless tobacco: Current User    Types: Snuff     Comment: "Been dipping since I was 7"  . Alcohol Use: No    Allergies  Allergen Reactions  . Aspirin Other (See Comments)    Nose bleeds.  . Sulfa Antibiotics Hives    Current Outpatient Prescriptions  Medication Sig Dispense Refill  . clonazePAM (KLONOPIN) 1 MG tablet Take 0.5 mg by mouth 2 (two) times daily.     Marland Kitchen dexlansoprazole (DEXILANT) 60 MG capsule Take 60 mg by mouth daily.    . Eluxadoline (VIBERZI) 75 MG TABS Take 1 tablet by mouth 2 (two) times daily. 60 tablet 3  . fexofenadine (ALLEGRA) 180 MG tablet Take 180 mg by mouth daily.    Marland Kitchen HYDROcodone-acetaminophen (NORCO) 7.5-325 MG tablet Take 1 tablet by mouth every 4 (four) hours as needed for moderate pain (Must last 30 days.  Do not drive or operate machinery while taking this medicine.). 120 tablet 0  . levonorgestrel-ethinyl estradiol (SEASONALE,INTROVALE,JOLESSA) 0.15-0.03 MG  tablet Take 1 tablet by mouth daily.    . rizatriptan (MAXALT) 10 MG tablet Take 1 tablet (10 mg total) by mouth as needed for migraine. May repeat in 2 hours if needed 10 tablet 1  . tiZANidine (ZANAFLEX) 4 MG tablet One by mouth every 8 hours as needed for spasm 90 tablet 3  . valACYclovir (VALTREX) 1000 MG tablet Take 1,000 mg by mouth 2 (two) times daily.    Marland Kitchen zolpidem (AMBIEN) 10 MG tablet Take 10 mg by mouth at bedtime as needed for sleep.     No current facility-administered medications for this visit.     Physical Exam  Blood pressure 113/79, pulse 96, temperature 98.1 F (36.7 C), height 5\' 5"  (1.651 m), weight 155 lb  (70.308 kg).  Constitutional: overall normal hygiene, normal nutrition, well developed, normal grooming, normal body habitus. Assistive device:none  Musculoskeletal: gait and station Limp none, muscle tone and strength are normal, no tremors or atrophy is present.  .  Neurological: coordination overall normal.  Deep tendon reflex/nerve stretch intact.  Sensation normal.  Cranial nerves II-XII intact.   Skin:   normal overall no scars, lesions, ulcers or rashes. No psoriasis.  Psychiatric: Alert and oriented x 3.  Recent memory intact, remote memory unclear.  Normal mood and affect. Well groomed.  Good eye contact.  Cardiovascular: overall no swelling, no varicosities, no edema bilaterally, normal temperatures of the legs and arms, no clubbing, cyanosis and good capillary refill.  Lymphatic: palpation is normal.  Spine/Pelvis examination:  Inspection:  Overall, sacoiliac joint benign and hips nontender; without crepitus or defects.   Thoracic spine inspection: Alignment normal without kyphosis present   Lumbar spine inspection:  Alignment  with normal lumbar lordosis, without scoliosis apparent.   Thoracic spine palpation:  without tenderness of spinal processes   Lumbar spine palpation: without tenderness of lumbar area; without tightness of lumbar muscles    Range of Motion:   Lumbar flexion, forward flexion is full  without pain or tenderness    Lumbar extension is full  without pain or tenderness   Left lateral bend is Normal  without pain or tenderness   Right lateral bend is Normal without pain or tenderness   Straight leg raising is Normal   Strength & tone: Normal   Stability overall normal stability    The patient has been educated about the nature of the problem(s) and counseled on treatment options.  The patient appeared to understand what I have discussed and is in agreement with it.  Encounter Diagnoses  Name Primary?  . Neck pain Yes  . Midline low back pain  without sciatica   . Gastroesophageal reflux disease without esophagitis     PLAN Call if any problems.  Precautions discussed.  Continue current medications.   Return to clinic 1 month

## 2016-03-16 ENCOUNTER — Ambulatory Visit (HOSPITAL_COMMUNITY): Payer: Medicare Other | Admitting: Physical Therapy

## 2016-03-16 ENCOUNTER — Encounter (HOSPITAL_COMMUNITY): Payer: Self-pay | Admitting: Physical Therapy

## 2016-03-16 DIAGNOSIS — R293 Abnormal posture: Secondary | ICD-10-CM

## 2016-03-16 DIAGNOSIS — R29898 Other symptoms and signs involving the musculoskeletal system: Secondary | ICD-10-CM

## 2016-03-16 DIAGNOSIS — M5441 Lumbago with sciatica, right side: Secondary | ICD-10-CM

## 2016-03-16 DIAGNOSIS — M6281 Muscle weakness (generalized): Secondary | ICD-10-CM

## 2016-03-16 NOTE — Therapy (Signed)
[place], [place], [address] Phone: [phone]   Fax:  [phone]  Physical Therapy Treatment/Discharge  Patient Details  Name: Julia Stanley MRN: [id number] Date of Birth: [birth year] Referring Provider: Sanjuana Kava, MD  Encounter Date: 03/16/2016      PT End of Session - 03/16/16 1227    Visit Number 5   Number of Visits 16   Date for PT Re-Evaluation 03/26/16   Authorization Type Medicare/Medicaid    Authorization Time Period 02/27/16 to 04/28/16   Authorization - Visit Number 5   Authorization - Number of Visits 10   PT Start Time 0945   PT Stop Time 1026   PT Time Calculation (min) 41 min   Activity Tolerance Patient tolerated treatment well   Behavior During Therapy [facility] for tasks assessed/performed      Past Medical History  Diagnosis Date  . Anxiety   . Seasonal allergies   . Abnormal Pap smear   . Nicotine addiction 10/27/2013    Dips since age 89  . Vaginal discharge 03/10/2014  . Frequent headaches   . Polyp of colon   . Urinary frequency 08/15/2015  . UTI (lower urinary tract infection) 08/15/2015    Past Surgical History  Procedure Laterality Date  . None    . Colonoscopy N/A 04/11/2015    SLF: 1. No source for diarrhea/ abdominal pain identified. 2. One large polyp removed. 3. Large internal hemorrhoids. ADENOMA. Next surveillance 2021  . Hemorrhoid banding N/A 04/11/2015    Procedure: HEMORRHOID BANDING;  Surgeon: Danie Binder, MD;  [facility];  Service: Endoscopy;  Laterality: N/A;  . Polyp removed  2016    There were no vitals filed for this visit.      Subjective Assessment - 03/16/16 0950    Subjective Pt states she is doing great. She was officially turned loose by Dr. Luna Glasgow. Has been doing her HEP regularly without any issues.    Pertinent History IBS, nicotine addiction, anxiety, surgical history unremarkable    How long can you sit comfortably? unlimited    How long can you stand comfortably? unlimited   How long can you walk comfortably? unlimited   Patient Stated Goals get stronger    Currently in Pain? No/denies            [facility] PT Assessment - 03/16/16 0001    Assessment   Medical Diagnosis neck pain and LBP with R sciatica    Referring Provider Sanjuana Kava, MD   Onset Date/Surgical Date --  Easter sunday 2017   Next MD Visit None   Precautions   Precautions Back   Precaution Comments no bending/lifting/twisting    Restrictions   Weight Bearing Restrictions No   Balance Screen   Has the patient fallen in the past 6 months No   Has the patient had a decrease in activity level because of a fear of falling?  No   Is the patient reluctant to leave their home because of a fear of falling?  No   Prior Function   Level of Independence Independent;Independent with gait;Independent with transfers;Independent with basic ADLs   Vocation On disability   Leisure get back to running, being able to play around with family    Observation/Other Assessments   Focus on Therapeutic Outcomes (FOTO)  2% limited  low back FOTO    Posture/Postural Control   Posture/Postural Control Postural limitations   Postural Limitations Rounded Shoulders;Forward head;Increased lumbar  lordosis   AROM   Overall AROM Comments shoulder flexion/ABD WFL; ER approx C7; IR approx T10. Hips appears WFL ROM wise  FABER negative bilaterally; scour mild pain on R, negative L   Cervical Flexion WNL   Cervical Extension WNL   Cervical - Right Side Bend WNL   Cervical - Left Side Bend WNL   Cervical - Right Rotation WNL   Cervical - Left Rotation WNL   Lumbar Flexion WNL   Lumbar Extension WNL   Lumbar - Right Side Bend WNL   Lumbar - Left Side Bend WNL   Strength   Right Hip Flexion 5/5   Right Hip Extension 5/5   Right Hip ABduction 5/5   Left Hip Flexion 5/5   Left Hip Extension 5/5   Left Hip ABduction 5/5   Right Knee Flexion 5/5   Right Knee  Extension 5/5   Left Knee Flexion 5/5   Left Knee Extension 5/5   Right Ankle Dorsiflexion 5/5   Left Ankle Dorsiflexion 5/5   Palpation   Palpation comment --   Bed Mobility   Sit to Sidelying Right 7: Independent   Sit to Sidelying Left 7: Independent                     [facility] Adult PT Treatment/Exercise - 03/16/16 0001    Lumbar Exercises: Stretches   Lower Trunk Rotation 3 reps;30 seconds  each direction   Lumbar Exercises: Standing   Functional Squats 10 reps  x2 sets against ball on wall   Lumbar Exercises: Supine   Bridge 5 reps  x2 sets   Bridge Limitations hold with alt knee extension                PT Education - 03/16/16 1225    Education provided Yes   Education Details discussed goals, HEP; reviewed some exercises and technique with lifting; encouraged continued walking daily for improved fitness; provided medical release form for attorney; postural awareness throughout her day   Person(s) Educated Patient   Methods Explanation;Demonstration   Comprehension Verbalized understanding;Returned demonstration          PT Short Term Goals - 03/16/16 1010    PT SHORT TERM GOAL #1   Title Patient to be able to maintain correct posture at least 70% of the time in order to improve general function and to reduce pain in general    Baseline 50%   Time 4   Period Weeks   Status Partially Met   PT SHORT TERM GOAL #2   Title Patient to demonstrate at least a 10 degree increase in cervical ROM on all planes in order to assist in reducing pain    Time 4   Period Weeks   Status Achieved   PT SHORT TERM GOAL #3   Title Patient to demonstrate at least a 10 degree increase in lumbar flexion and extension and will be able to laterally flex to midline of knee jiont bilaterally in order to reduce overall pain    Time 4   Period Weeks   Status Achieved   PT SHORT TERM GOAL #4   Title Patient to experience no more than 4/10 pain in her cervical and  lumbar regions in order to improve general function and task performance    Time 4   Period Weeks   Status Achieved   PT SHORT TERM GOAL #5   Title Patient to be independent in correctly and consistently performing appropriate  HEP, to be updated PRN    Time 1   Period Weeks   Status Achieved           PT Long Term Goals - 03/16/16 1011    PT LONG TERM GOAL #1   Title Patient to demonstrate strength at least 4+/5 in order to assist in reducing pain and improving overall function    Time 8   Period Weeks   Status Achieved   PT LONG TERM GOAL #2   Title Patient to demonstrate cervical and lumbar ROM WFL on all planes in order to reduce pain and improve overall functional task performance    Time 8   Period Weeks   Status Achieved   PT LONG TERM GOAL #3   Title Patient to demonstrate correct proper lifting form in order to assist in preventing exacerbation of pain and symtpoms    Baseline poor technique with placing box back onto floor   Time 8   Period Weeks   Status Achieved   PT LONG TERM GOAL #4   Title Patient to be participatory in regular walking program, at least 30 mintues, at least 4 days per week, in order to maintain functional gains and promote improved health status    Baseline starting to walk 1-1.5 miles a day    Time 8   Period Weeks   Status Achieved   PT LONG TERM GOAL #5   Title Patient to experience no more than 2/10 pain in her cervical or lumbar regions in order to enhance function and task performance skills    Time 8   Period Weeks   Status Achieved               Plan - 03/16/16 1228    Clinical Impression Statement Pt was discharged this visit due to meeting all of her short/long term goals and with return to full independence and function. At this time, she demonstrates full and pain free cervical/lumbar ROM as well as good BLE strength. She has been doing her HEP regularly and is now walking 1-1.5 miles a day to improve her fitness. She  has been pain free for quite some time now and is back to her dailt routine, as well as demonstrating proper lifting technique throughout her sessions. At this time, she no longer requires skilled PT services and is in agreement with d/c due to being back at her PLOF/independence.   Rehab Potential Good   PT Frequency 2x / week   PT Duration 8 weeks   PT Treatment/Interventions ADLs/Self Care Home Management;Cryotherapy;Moist Heat;Functional mobility training;Therapeutic activities;Therapeutic exercise;Balance training;Neuromuscular re-education;Patient/family education;Manual techniques;Passive range of motion;Taping   PT Next Visit Plan d/c   PT Home Exercise Plan reviewed previous HEP   Consulted and Agree with Plan of Care Patient      Patient will benefit from skilled therapeutic intervention in order to improve the following deficits and impairments:  Hypomobility, Decreased strength, Pain, Increased muscle spasms, Decreased range of motion, Improper body mechanics, Decreased coordination, Postural dysfunction  Visit Diagnosis: Midline low back pain with right-sided sciatica  Other symptoms and signs involving the musculoskeletal system  Muscle weakness (generalized)  Abnormal posture     Problem List Patient Active Problem List   Diagnosis Date Noted  . Urinary frequency 08/15/2015  . UTI (lower urinary tract infection) 08/15/2015  . Hemorrhoids, internal, with bleeding   . Frequent headaches 03/01/2015  . GERD (gastroesophageal reflux disease) 02/02/2015  . IBS (irritable bowel syndrome) 11/30/2014  .  Vaginal discharge 03/10/2014  . Routine gynecological examination 02/08/2014  . Contraception management 02/08/2014  . Nicotine addiction 10/27/2013  . Abnormal pap 01/23/2013   PHYSICAL THERAPY DISCHARGE SUMMARY  Visits from Start of Care: 5  Current functional level related to goals / functional outcomes: Returned to independence, pain free/full ROM of  cervical/lumbar spine, full BLE strength.   Remaining deficits: 50% postural awareness which pt was educated regarding   Education / Equipment: Advanced home management program provided last visit  Plan: Patient agrees to discharge.  Patient goals were partially met. Patient is being discharged due to meeting the stated rehab goals.  ?????       12:36 PM,03/16/2016 Elly Modena PT, DPT [facility] [place] [address] [place], [place], [address] Phone: [phone]   Fax:  [phone]  Name: Julia Stanley MRN: [id number] Date of Birth: [birth year]

## 2016-03-20 ENCOUNTER — Encounter (HOSPITAL_COMMUNITY): Payer: Medicare Other | Admitting: Physical Therapy

## 2016-03-21 ENCOUNTER — Ambulatory Visit (HOSPITAL_COMMUNITY): Payer: Medicare Other | Admitting: Physical Therapy

## 2016-03-21 ENCOUNTER — Ambulatory Visit: Payer: Medicare Other | Admitting: Orthopaedic Surgery

## 2016-03-23 ENCOUNTER — Encounter (HOSPITAL_COMMUNITY): Payer: Medicare Other

## 2016-03-26 ENCOUNTER — Encounter (HOSPITAL_COMMUNITY): Payer: Medicare Other | Admitting: Physical Therapy

## 2016-03-28 ENCOUNTER — Encounter (HOSPITAL_COMMUNITY): Payer: Medicare Other

## 2016-03-30 ENCOUNTER — Encounter (HOSPITAL_COMMUNITY): Payer: Medicare Other | Admitting: Physical Therapy

## 2016-04-02 ENCOUNTER — Encounter (HOSPITAL_COMMUNITY): Payer: Medicare Other | Admitting: Physical Therapy

## 2016-04-04 ENCOUNTER — Telehealth: Payer: Self-pay | Admitting: Obstetrics and Gynecology

## 2016-04-04 ENCOUNTER — Encounter: Payer: Self-pay | Admitting: Obstetrics and Gynecology

## 2016-04-04 ENCOUNTER — Encounter (HOSPITAL_COMMUNITY): Payer: Medicare Other | Admitting: Physical Therapy

## 2016-04-04 ENCOUNTER — Other Ambulatory Visit: Payer: Self-pay | Admitting: *Deleted

## 2016-04-04 NOTE — Telephone Encounter (Signed)
Pt called states that she has tried to get a refill from her pharmacy regarding her birth control and they are telling her that her medicaid wont pay. Pt would like for a nurse to give them a call. Please contact pt

## 2016-04-04 NOTE — Telephone Encounter (Signed)
Pt aware that prior auth has been sent in and that it will take a few days for [place] to get an answer wether or not her insurance will cover her BCP or not. Pt states that she called her insurance herself and they told her that the medication would not be covered regardless of an auth, but gave her Kimidess that they will cover.

## 2016-04-05 ENCOUNTER — Encounter: Payer: Self-pay | Admitting: Obstetrics and Gynecology

## 2016-04-05 ENCOUNTER — Encounter: Payer: Self-pay | Admitting: Adult Health

## 2016-04-05 ENCOUNTER — Telehealth: Payer: Self-pay | Admitting: Adult Health

## 2016-04-05 ENCOUNTER — Telehealth: Payer: Self-pay | Admitting: Orthopaedic Surgery

## 2016-04-05 MED ORDER — HYDROCODONE-ACETAMINOPHEN 7.5-325 MG PO TABS: 1.0000 | ORAL_TABLET | ORAL | Status: DC | PRN

## 2016-04-05 MED ORDER — RIZATRIPTAN BENZOATE 10 MG PO TABS: 10.0000 mg | ORAL_TABLET | ORAL | Status: DC | PRN

## 2016-04-05 MED ORDER — DESOGESTREL-ETHINYL ESTRADIOL 0.15-0.02/0.01 MG (21/5) PO TABS: 1.0000 | ORAL_TABLET | Freq: Every day | ORAL | Status: DC

## 2016-04-05 NOTE — Telephone Encounter (Signed)
Rx done. 

## 2016-04-05 NOTE — Telephone Encounter (Signed)
Chrystal,RN spoke with pt. See previous phone message. [place]

## 2016-04-05 NOTE — Telephone Encounter (Signed)
Pt aware kimidess sent in.

## 2016-04-05 NOTE — Telephone Encounter (Signed)
Pt states her insurance [facility]) will cover Kimidess for bcp.

## 2016-04-05 NOTE — Addendum Note (Signed)
Addended by: Derrek Monaco A on: 04/05/2016 05:13 PM   Modules accepted: Orders, Medications

## 2016-04-06 ENCOUNTER — Encounter (HOSPITAL_COMMUNITY): Payer: Medicare Other | Admitting: Physical Therapy

## 2016-04-09 ENCOUNTER — Encounter (HOSPITAL_COMMUNITY): Payer: Medicare Other | Admitting: Physical Therapy

## 2016-04-10 ENCOUNTER — Ambulatory Visit: Payer: Medicare Other | Admitting: Orthopaedic Surgery

## 2016-04-11 ENCOUNTER — Encounter (HOSPITAL_COMMUNITY): Payer: Medicare Other | Admitting: Physical Therapy

## 2016-04-11 ENCOUNTER — Ambulatory Visit: Payer: Medicare Other | Admitting: Orthopaedic Surgery

## 2016-04-13 ENCOUNTER — Encounter (HOSPITAL_COMMUNITY): Payer: Medicare Other | Admitting: Physical Therapy

## 2016-04-16 ENCOUNTER — Encounter (HOSPITAL_COMMUNITY): Payer: Medicare Other | Admitting: Physical Therapy

## 2016-04-18 ENCOUNTER — Ambulatory Visit (HOSPITAL_COMMUNITY): Payer: Medicare Other | Admitting: Physical Therapy

## 2016-04-22 ENCOUNTER — Telehealth: Payer: Self-pay | Admitting: Orthopaedic Surgery

## 2016-04-25 MED ORDER — HYDROCODONE-ACETAMINOPHEN 7.5-325 MG PO TABS: 1.0000 | ORAL_TABLET | ORAL | Status: DC | PRN

## 2016-04-25 NOTE — Telephone Encounter (Signed)
Rx Done . 

## 2016-04-26 ENCOUNTER — Encounter: Payer: Self-pay | Admitting: Adult Health

## 2016-04-26 ENCOUNTER — Encounter: Payer: Self-pay | Admitting: Obstetrics and Gynecology

## 2016-05-09 ENCOUNTER — Other Ambulatory Visit (HOSPITAL_COMMUNITY)
Admission: RE | Admit: 2016-05-09 | Discharge: 2016-05-09 | Disposition: A | Discharge status: Home / Self Care | Payer: Medicare Other | Source: Ambulatory Visit | Attending: Advanced Practice Midwife | Admitting: Advanced Practice Midwife

## 2016-05-09 ENCOUNTER — Ambulatory Visit (INDEPENDENT_AMBULATORY_CARE_PROVIDER_SITE_OTHER): Payer: Medicare Other | Admitting: Advanced Practice Midwife

## 2016-05-09 ENCOUNTER — Encounter: Payer: Self-pay | Admitting: Advanced Practice Midwife

## 2016-05-09 VITALS — BP 110/70 | HR 76 | Ht 65.0 in | Wt 161.0 lb

## 2016-05-09 DIAGNOSIS — Z0000 Encounter for general adult medical examination without abnormal findings: Secondary | ICD-10-CM | POA: Diagnosis not present

## 2016-05-09 DIAGNOSIS — G8929 Other chronic pain: Secondary | ICD-10-CM

## 2016-05-09 DIAGNOSIS — R309 Painful micturition, unspecified: Secondary | ICD-10-CM

## 2016-05-09 DIAGNOSIS — Z3202 Encounter for pregnancy test, result negative: Secondary | ICD-10-CM | POA: Diagnosis not present

## 2016-05-09 DIAGNOSIS — Z1151 Encounter for screening for human papillomavirus (HPV): Secondary | ICD-10-CM | POA: Insufficient documentation

## 2016-05-09 DIAGNOSIS — F419 Anxiety disorder, unspecified: Secondary | ICD-10-CM

## 2016-05-09 DIAGNOSIS — Z01419 Encounter for gynecological examination (general) (routine) without abnormal findings: Secondary | ICD-10-CM

## 2016-05-09 DIAGNOSIS — Z01411 Encounter for gynecological examination (general) (routine) with abnormal findings: Secondary | ICD-10-CM

## 2016-05-09 DIAGNOSIS — Z113 Encounter for screening for infections with a predominantly sexual mode of transmission: Secondary | ICD-10-CM | POA: Insufficient documentation

## 2016-05-09 DIAGNOSIS — K589 Irritable bowel syndrome without diarrhea: Secondary | ICD-10-CM

## 2016-05-09 DIAGNOSIS — K219 Gastro-esophageal reflux disease without esophagitis: Secondary | ICD-10-CM

## 2016-05-09 LAB — POCT URINALYSIS DIPSTICK
Glucose, UA: NEGATIVE
Ketones, UA: NEGATIVE
LEUKOCYTES UA: NEGATIVE
NITRITE UA: NEGATIVE
Protein, UA: NEGATIVE
RBC UA: NEGATIVE

## 2016-05-09 LAB — POCT URINE PREGNANCY: PREG TEST UR: NEGATIVE

## 2016-05-09 MED ORDER — NORGESTIMATE-ETH ESTRADIOL 0.25-35 MG-MCG PO TABS: 1.0000 | ORAL_TABLET | Freq: Every day | ORAL | Status: DC

## 2016-05-09 NOTE — Progress Notes (Signed)
Julia Stanley 30 y.o.  Filed Vitals:   05/09/16 1141  BP: 110/70  Pulse: 76     Filed Weights   05/09/16 1141  Weight: 161 lb (73.029 kg)    Past Medical History: Past Medical History  Diagnosis Date  . Anxiety   . Seasonal allergies   . Abnormal Pap smear   . Nicotine addiction 10/27/2013    Dips since age 44  . Vaginal discharge 03/10/2014  . Frequent headaches   . Polyp of colon   . Urinary frequency 08/15/2015  . UTI (lower urinary tract infection) 08/15/2015    Past Surgical History: Past Surgical History  Procedure Laterality Date  . None    . Colonoscopy N/A 04/11/2015    SLF: 1. No source for diarrhea/ abdominal pain identified. 2. One large polyp removed. 3. Large internal hemorrhoids. ADENOMA. Next surveillance 2021  . Hemorrhoid banding N/A 04/11/2015    Procedure: HEMORRHOID BANDING;  Surgeon: Danie Binder, MD;  [facility];  Service: Endoscopy;  Laterality: N/A;  . Polyp removed  2016    Family History: Family History  Problem Relation Age of Onset  . Cancer Father   . Hypertension Maternal Grandfather   . Hyperlipidemia Maternal Grandfather   . Diabetes Maternal Grandfather   . Hypertension Paternal Grandmother   . Cancer Paternal Grandfather   . Colon cancer Neg Hx     Social History: Social History  Substance Use Topics  . Smoking status: Never Smoker   . Smokeless tobacco: Current User    Types: Snuff     Comment: "Been dipping since I was 7"  . Alcohol Use: No    Allergies:  Allergies  Allergen Reactions  . Aspirin Other (See Comments)    Nose bleeds.  . Sulfa Antibiotics Hives      Current outpatient prescriptions:  .  clonazePAM (KLONOPIN) 1 MG tablet, Take 0.5 mg by mouth 2 (two) times daily. , Disp: , Rfl:  .  desogestrel-ethinyl estradiol (KIMIDESS) 0.15-0.02/0.01 MG (21/5) tablet, Take 1 tablet by mouth daily., Disp: 1 Package, Rfl: 11 .  tiZANidine (ZANAFLEX) 4 MG tablet, One by mouth every 8 hours as needed  for spasm, Disp: 90 tablet, Rfl: 3 .  valACYclovir (VALTREX) 1000 MG tablet, Take 1,000 mg by mouth 2 (two) times daily., Disp: , Rfl:  .  zolpidem (AMBIEN) 10 MG tablet, Take 10 mg by mouth at bedtime as needed for sleep., Disp: , Rfl:  .  fexofenadine (ALLEGRA) 180 MG tablet, Take 180 mg by mouth daily. Reported on 05/09/2016, Disp: , Rfl:  .  HYDROcodone-acetaminophen (NORCO) 7.5-325 MG tablet, Take 1 tablet by mouth every 4 (four) hours as needed for moderate pain (Must last 30 days.  Do not drive or operate machinery while taking this medicine.). (Patient not taking: Reported on 05/09/2016), Disp: 120 tablet, Rfl: 0 .  norgestimate-ethinyl estradiol (ORTHO-CYCLEN,SPRINTEC,PREVIFEM) 0.25-35 MG-MCG tablet, Take 1 tablet by mouth daily., Disp: 1 Package, Rfl: 11 .  rizatriptan (MAXALT) 10 MG tablet, Take 1 tablet (10 mg total) by mouth as needed for migraine. May repeat in 2 hours if needed (Patient not taking: Reported on 05/09/2016), Disp: 10 tablet, Rfl: 1  History of Present Illness: Here for pap.  Pap 4/15 normal, pap 2014 also normal. Will get pap today.  On COC's for [facility]. For 2 months.  Still nauseated  Was on depo, quit bc she wants to get pregnant next year.  [employer] insurance pays for pills, picky wi/brand. Requested  Norgestimate. Spots some during intercourse.  Wants to make sure she doesn't have a UTI.  Has some urinary frequency   Review of Systems   Patient denies any headaches, blurred vision, shortness of breath, chest pain, abdominal pain,; Has IBS, conttrolled w/meds; No nausea during sugar pills of COCs   Physical Exam: General:  Well developed, well nourished, no acute distress Skin:  Warm and dry Neck:  Midline trachea, normal thyroid Lungs; Clear to auscultation bilaterally Breast:  No dominant palpable mass, retraction, or nipple discharge Cardiovascular: Regular rate and rhythm Abdomen:  Soft, non tender, no hepatosplenomegaly Pelvic:  External genitalia is normal in  appearance.  The vagina is normal in appearance.  The cervix is nulliparous.  Not friable.  Uterus is felt to be normal size, shape, and contour.  No adnexal masses or tenderness noted.  Extremities:  No swelling or varicosities noted Psych:  No mood changes.     Impression:  Normal pap, exam     Plan: pap q 3 years Rx Sprintec, if strill causes nausea, call me May do Nexplanon

## 2016-05-15 LAB — CYTOLOGY - PAP

## 2016-05-20 ENCOUNTER — Telehealth: Payer: Self-pay | Admitting: Orthopaedic Surgery

## 2016-05-21 MED ORDER — HYDROCODONE-ACETAMINOPHEN 7.5-325 MG PO TABS: 1.0000 | ORAL_TABLET | ORAL | 0 refills | Status: DC | PRN

## 2016-05-23 ENCOUNTER — Encounter: Payer: Self-pay | Admitting: Orthopaedic Surgery

## 2016-05-23 ENCOUNTER — Ambulatory Visit (INDEPENDENT_AMBULATORY_CARE_PROVIDER_SITE_OTHER): Payer: Medicare Other | Admitting: Orthopaedic Surgery

## 2016-05-23 VITALS — BP 123/75 | HR 92 | Temp 97.9°F | Ht 65.0 in | Wt 157.4 lb

## 2016-05-23 DIAGNOSIS — K219 Gastro-esophageal reflux disease without esophagitis: Secondary | ICD-10-CM | POA: Diagnosis not present

## 2016-05-23 DIAGNOSIS — M545 Low back pain, unspecified: Secondary | ICD-10-CM

## 2016-05-23 DIAGNOSIS — M542 Cervicalgia: Secondary | ICD-10-CM

## 2016-05-23 NOTE — Progress Notes (Signed)
Patient XO:[id number] Julia Stanley, female DOB:12-May-1986, 30 y.o. [id number]  Chief Complaint  Patient presents with  . Follow-up    Back pain    HPI  Julia Stanley is a 30 y.o. female who has chronic neck and back pain.  She is stable. She has no paresthesias.  She has no new trauma.  She is active and doing her exercises. HPI  Body mass index is 26.19 kg/m.  ROS  Review of Systems  Constitutional:        Patient does not have Diabetes Mellitus. Patient does not have hypertension. Patient does not have COPD or shortness of breath. Patient does not have BMI > 35. Patient does not have current smoking history. But she dips snuff.  HENT: Negative for congestion.   Respiratory: Negative for cough and shortness of breath.   Cardiovascular: Negative for chest pain and leg swelling.  Endocrine: Positive for cold intolerance.  Musculoskeletal: Positive for back pain and myalgias.  Allergic/Immunologic: Positive for environmental allergies.  Psychiatric/Behavioral: The patient is nervous/anxious.     Past Medical History:  Diagnosis Date  . Abnormal Pap smear   . Anxiety   . Frequent headaches   . Nicotine addiction 10/27/2013   Dips since age 29  . Polyp of colon   . Seasonal allergies   . Urinary frequency 08/15/2015  . UTI (lower urinary tract infection) 08/15/2015  . Vaginal discharge 03/10/2014    Past Surgical History:  Procedure Laterality Date  . COLONOSCOPY N/A 04/11/2015   SLF: 1. No source for diarrhea/ abdominal pain identified. 2. One large polyp removed. 3. Large internal hemorrhoids. ADENOMA. Next surveillance 2021  . HEMORRHOID BANDING N/A 04/11/2015   Procedure: HEMORRHOID BANDING;  Surgeon: Danie Binder, MD;  Location: [facility];  Service: Endoscopy;  Laterality: N/A;  . None    . polyp removed  2016    Family History  Problem Relation Age of Onset  . Cancer Father   . Hypertension Maternal Grandfather   . Hyperlipidemia Maternal Grandfather   .  Diabetes Maternal Grandfather   . Hypertension Paternal Grandmother   . Cancer Paternal Grandfather   . Colon cancer Neg Hx     Social History Social History  Substance Use Topics  . Smoking status: Never Smoker  . Smokeless tobacco: Current User    Types: Snuff     Comment: "Been dipping since I was 7"  . Alcohol use No    Allergies  Allergen Reactions  . Aspirin Other (See Comments)    Nose bleeds.  . Sulfa Antibiotics Hives    Current Outpatient Prescriptions  Medication Sig Dispense Refill  . clonazePAM (KLONOPIN) 1 MG tablet Take 0.5 mg by mouth 2 (two) times daily.     Marland Kitchen desogestrel-ethinyl estradiol (KIMIDESS) 0.15-0.02/0.01 MG (21/5) tablet Take 1 tablet by mouth daily. 1 Package 11  . fexofenadine (ALLEGRA) 180 MG tablet Take 180 mg by mouth daily. Reported on 05/09/2016    . HYDROcodone-acetaminophen (NORCO) 7.5-325 MG tablet Take 1 tablet by mouth every 4 (four) hours as needed for moderate pain (Must last 30 days.  Do not drive or operate machinery while taking this medicine.). 120 tablet 0  . norgestimate-ethinyl estradiol (ORTHO-CYCLEN,SPRINTEC,PREVIFEM) 0.25-35 MG-MCG tablet Take 1 tablet by mouth daily. 1 Package 11  . rizatriptan (MAXALT) 10 MG tablet Take 1 tablet (10 mg total) by mouth as needed for migraine. May repeat in 2 hours if needed 10 tablet 1  . tiZANidine (ZANAFLEX) 4 MG tablet One by  mouth every 8 hours as needed for spasm 90 tablet 3  . valACYclovir (VALTREX) 1000 MG tablet Take 1,000 mg by mouth 2 (two) times daily.    Marland Kitchen zolpidem (AMBIEN) 10 MG tablet Take 10 mg by mouth at bedtime as needed for sleep.     No current facility-administered medications for this visit.      Physical Exam  Blood pressure 123/75, pulse 92, temperature 97.9 F (36.6 C), height 5\' 5"  (1.651 m), weight 157 lb 6.4 oz (71.4 kg).  Constitutional: overall normal hygiene, normal nutrition, well developed, normal grooming, normal body habitus. Assistive  device:none  Musculoskeletal: gait and station Limp none, muscle tone and strength are normal, no tremors or atrophy is present.  .  Neurological: coordination overall normal.  Deep tendon reflex/nerve stretch intact.  Sensation normal.  Cranial nerves II-XII intact.   Skin:   normal overall no scars, lesions, ulcers or rashes. No psoriasis.  Psychiatric: Alert and oriented x 3.  Recent memory intact, remote memory unclear.  Normal mood and affect. Well groomed.  Good eye contact.  Cardiovascular: overall no swelling, no varicosities, no edema bilaterally, normal temperatures of the legs and arms, no clubbing, cyanosis and good capillary refill.  Lymphatic: palpation is normal.  Spine/Pelvis examination:  Inspection:  Overall, sacoiliac joint benign and hips nontender; without crepitus or defects.   Thoracic spine inspection: Alignment normal without kyphosis present   Lumbar spine inspection:  Alignment  with normal lumbar lordosis, without scoliosis apparent.   Thoracic spine palpation:  without tenderness of spinal processes   Lumbar spine palpation: with tenderness of lumbar area; without tightness of lumbar muscles    Range of Motion:   Lumbar flexion, forward flexion is 45 without pain or tenderness    Lumbar extension is full without pain or tenderness   Left lateral bend is Normal  without pain or tenderness   Right lateral bend is Normal without pain or tenderness   Straight leg raising is Normal   Strength & tone: Normal   Stability overall normal stability     The patient has been educated about the nature of the problem(s) and counseled on treatment options.  The patient appeared to understand what I have discussed and is in agreement with it.  Encounter Diagnoses  Name Primary?  . Neck pain Yes  . Midline low back pain without sciatica   . Gastroesophageal reflux disease without esophagitis     PLAN Call if any problems.  Precautions discussed.  Continue  current medications.   Return to clinic 3 months   Electronically Signed Sanjuana Kava, MD 8/2/20179:37 AM

## 2016-05-28 ENCOUNTER — Telehealth: Payer: Self-pay | Admitting: Orthopaedic Surgery

## 2016-05-29 ENCOUNTER — Encounter: Payer: Self-pay | Admitting: Adult Health

## 2016-05-29 ENCOUNTER — Telehealth: Payer: Self-pay | Admitting: Orthopaedic Surgery

## 2016-05-30 ENCOUNTER — Other Ambulatory Visit: Payer: Self-pay | Admitting: Adult Health

## 2016-05-30 MED ORDER — TIZANIDINE HCL 4 MG PO TABS: 4.0000 mg | ORAL_TABLET | Freq: Three times a day (TID) | ORAL | 2 refills | Status: DC | PRN

## 2016-05-30 MED ORDER — LEVONORGEST-ETH ESTRAD 91-DAY 0.15-0.03 &0.01 MG PO TABS: 1.0000 | ORAL_TABLET | Freq: Every day | ORAL | 4 refills | Status: DC

## 2016-06-06 ENCOUNTER — Encounter: Payer: Self-pay | Admitting: Adult Health

## 2016-06-13 ENCOUNTER — Telehealth: Payer: Self-pay | Admitting: Orthopaedic Surgery

## 2016-06-14 MED ORDER — HYDROCODONE-ACETAMINOPHEN 7.5-325 MG PO TABS: 1.0000 | ORAL_TABLET | Freq: Four times a day (QID) | ORAL | 0 refills | Status: DC | PRN

## 2016-06-21 ENCOUNTER — Encounter: Payer: Self-pay | Admitting: Gastroenterology

## 2016-06-25 ENCOUNTER — Telehealth: Payer: Self-pay | Admitting: Orthopaedic Surgery

## 2016-06-26 MED ORDER — HYDROCODONE-ACETAMINOPHEN 7.5-325 MG PO TABS: 1.0000 | ORAL_TABLET | Freq: Four times a day (QID) | ORAL | 0 refills | Status: DC | PRN

## 2016-07-24 ENCOUNTER — Other Ambulatory Visit: Payer: Self-pay | Admitting: *Deleted

## 2016-07-24 ENCOUNTER — Other Ambulatory Visit: Payer: Self-pay | Admitting: Orthopaedic Surgery

## 2016-07-24 MED ORDER — HYDROCODONE-ACETAMINOPHEN 7.5-325 MG PO TABS: 1.0000 | ORAL_TABLET | Freq: Four times a day (QID) | ORAL | 0 refills | Status: DC | PRN

## 2016-07-25 ENCOUNTER — Telehealth: Payer: Self-pay | Admitting: Gastroenterology

## 2016-07-25 ENCOUNTER — Telehealth: Payer: Self-pay | Admitting: Orthopaedic Surgery

## 2016-07-25 NOTE — Telephone Encounter (Signed)
Patient relays [facility] has closed; she is now with [facility], [place]; requests to update pharmacy information

## 2016-07-25 NOTE — Telephone Encounter (Signed)
Sending refill request to refill box. [facility] is now listed for the pharmacy.  See phone note of 03/06/2016, pt was taking the Viberzi.

## 2016-07-25 NOTE — Telephone Encounter (Signed)
Updated.

## 2016-07-25 NOTE — Telephone Encounter (Signed)
[facility] called to say that [facility] has lost their license and is now closed. [facility] has taken the patients that used to go there. The pharmacist Kenney Houseman) called to say that Fela Botkin was needing a prescription of Viberzi. She said it wasn't in her profile when it was transferred over to them and she didn't know if the patient needed a refill or not, but she did need the prescription sent to them to have on file there. Pt does have an appointment with [place] next week.

## 2016-07-26 MED ORDER — ELUXADOLINE 75 MG PO TABS: 75.0000 mg | ORAL_TABLET | Freq: Two times a day (BID) | ORAL | 5 refills | Status: DC

## 2016-07-26 NOTE — Telephone Encounter (Signed)
Called Viberzi 75 mg #60 one bid with food with 5 refills to [facility] at [facility].

## 2016-07-26 NOTE — Telephone Encounter (Signed)
Can you please print and fax or call in? Will not escribe.

## 2016-07-26 NOTE — Addendum Note (Signed)
Addended by: Mahala Menghini on: 07/26/2016 09:59 AM   Modules accepted: Orders

## 2016-08-01 ENCOUNTER — Other Ambulatory Visit: Payer: Self-pay | Admitting: *Deleted

## 2016-08-01 ENCOUNTER — Other Ambulatory Visit: Payer: Self-pay

## 2016-08-01 ENCOUNTER — Ambulatory Visit (INDEPENDENT_AMBULATORY_CARE_PROVIDER_SITE_OTHER): Payer: Medicare Other | Admitting: Gastroenterology

## 2016-08-01 ENCOUNTER — Encounter: Payer: Self-pay | Admitting: Gastroenterology

## 2016-08-01 DIAGNOSIS — K580 Irritable bowel syndrome with diarrhea: Secondary | ICD-10-CM | POA: Diagnosis not present

## 2016-08-01 DIAGNOSIS — K648 Other hemorrhoids: Secondary | ICD-10-CM

## 2016-08-01 DIAGNOSIS — K219 Gastro-esophageal reflux disease without esophagitis: Secondary | ICD-10-CM | POA: Diagnosis not present

## 2016-08-01 MED ORDER — DEXLANSOPRAZOLE 60 MG PO CPDR: DELAYED_RELEASE_CAPSULE | ORAL | 3 refills | Status: DC

## 2016-08-01 MED ORDER — ELUXADOLINE 75 MG PO TABS: 75.0000 mg | ORAL_TABLET | Freq: Two times a day (BID) | ORAL | 3 refills | Status: DC

## 2016-08-01 MED ORDER — LEVONORGEST-ETH ESTRAD 91-DAY 0.15-0.03 &0.01 MG PO TABS: 1.0000 | ORAL_TABLET | Freq: Every day | ORAL | 3 refills | Status: DC

## 2016-08-01 NOTE — Assessment & Plan Note (Signed)
SYMPTOMS FAIRLY WELL CONTROLLED on Viberzi 1-2 times a  Day.  DRINK WATER TO KEEP YOUR URINE LIGHT YELLOW. FOLLOW A HIGH FIBER DIET. AVOID ITEMS THAT CAUSE BLOATING & GAS.  HANDOUT GIVEN.  Continue Viberzi. FOLLOW UP IN 6 MOS.

## 2016-08-01 NOTE — Patient Instructions (Signed)
DRINK WATER TO KEEP YOUR URINE LIGHT YELLOW.  FOLLOW A HIGH FIBER DIET. AVOID ITEMS THAT CAUSE BLOATING & GAS.SEE INFO BELOW.  CONTINUE [place].  FOLLOW UP IN 6 MOS. MERRY CHRISTMAS AND HAPPY NEW YEAR!  High-Fiber Diet A high-fiber diet changes your normal diet to include more whole grains, legumes, fruits, and vegetables. Changes in the diet involve replacing refined carbohydrates with unrefined foods. The calorie level of the diet is essentially unchanged. The Dietary Reference Intake (recommended amount) for adult males is 38 grams per day. For adult females, it is 25 grams per day. Pregnant and lactating women should consume 28 grams of fiber per day.Fiber is the intact part of a plant that is not broken down during digestion. Functional fiber is fiber that has been isolated from the plant to provide a beneficial effect in the body.  PURPOSE  Increase stool bulk.   Ease and regulate bowel movements.   Lower cholesterol.   REDUCE RISK OF COLON CANCER  INDICATIONS THAT YOU NEED MORE FIBER  Constipation and hemorrhoids.   Uncomplicated diverticulosis (intestine condition) and irritable bowel syndrome.   Weight management.   As a protective measure against hardening of the arteries (atherosclerosis), diabetes, and cancer.   GUIDELINES FOR INCREASING FIBER IN THE DIET  Start adding fiber to the diet slowly. A gradual increase of about 5 more grams (2 slices of whole-wheat bread, 2 servings of most fruits or vegetables, or 1 bowl of high-fiber cereal) per day is best. Too rapid an increase in fiber may result in constipation, flatulence, and bloating.   Drink enough water and fluids to keep your urine clear or pale yellow. Water, juice, or caffeine-free drinks are recommended. Not drinking enough fluid may cause constipation.   Eat a variety of high-fiber foods rather than one type of fiber.   Try to increase your intake of fiber through using high-fiber  foods rather than fiber pills or supplements that contain small amounts of fiber.   The goal is to change the types of food eaten. Do not supplement your present diet with high-fiber foods, but replace foods in your present diet.   INCLUDE A VARIETY OF FIBER SOURCES  Replace refined and processed grains with whole grains, canned fruits with fresh fruits, and incorporate other fiber sources. White rice, white breads, and most bakery goods contain little or no fiber.   Brown whole-grain rice, buckwheat oats, and many fruits and vegetables are all good sources of fiber. These include: broccoli, Brussels sprouts, cabbage, cauliflower, beets, sweet potatoes, white potatoes (skin on), carrots, tomatoes, eggplant, squash, berries, fresh fruits, and dried fruits.   Cereals appear to be the richest source of fiber. Cereal fiber is found in whole grains and bran. Bran is the fiber-rich outer coat of cereal grain, which is largely removed in refining. In whole-grain cereals, the bran remains. In breakfast cereals, the largest amount of fiber is found in those with "bran" in their names. The fiber content is sometimes indicated on the label.   You may need to include additional fruits and vegetables each day.   In baking, for 1 cup white flour, you may use the following substitutions:   1 cup whole-wheat flour minus 2 tablespoons.   1/2 cup white flour plus 1/2 cup whole-wheat flour.

## 2016-08-01 NOTE — Progress Notes (Addendum)
Subjective:    Patient ID: Julia Stanley, female    DOB: April 23, 1986, 30 y.o.   MRN: [id number]  Renee Rival, NP   HPI   Abdominal pain: 1-2x.week in lower abdomen(CRAMPY). Bloating: no. Watery stools: NONE LATELY ON VIBERZI. NEEDS 3 MO SUPPLY OF DEXILANT AND VIBERZI. APPETITE: EAT ALL THE TIME. WEIGHT: SAME. HOBBIES: HUNTS AND WATCHES TV, DOINGMORE FISHING THESE DAYS.   PT DENIES FEVER, CHILLS, HEMATOCHEZIA, nausea, vomiting, melena, diarrhea, CHEST PAIN, SHORTNESS OF BREATH,  CHANGE IN BOWEL IN HABITS, constipation, problems swallowing, OR heartburn or indigestion.   Past Medical History:  Diagnosis Date  . Abnormal Pap smear   . Anxiety   . Frequent headaches   . Nicotine addiction 10/27/2013   Dips since age 26  . Polyp of colon   . Seasonal allergies   . Urinary frequency 08/15/2015  . UTI (lower urinary tract infection) 08/15/2015  . Vaginal discharge 03/10/2014    Past Surgical History:  Procedure Laterality Date  . COLONOSCOPY N/A 04/11/2015   SLF: 1. No source for diarrhea/ abdominal pain identified. 2. One large polyp removed. 3. Large internal hemorrhoids. ADENOMA. Next surveillance 2021  . HEMORRHOID BANDING N/A 04/11/2015   Procedure: HEMORRHOID BANDING;  Surgeon: Danie Binder, MD;  Location: [facility];  Service: Endoscopy;  Laterality: N/A;  . None    . polyp removed  2016    Allergies  Allergen Reactions  . Aspirin Other (See Comments)    Nose bleeds.  . Sulfa Antibiotics Hives   Current Outpatient Prescriptions  Medication Sig Dispense Refill  . clonazePAM (KLONOPIN) 1 MG tablet Take 0.5 mg by mouth 2 (two) times daily.     . Eluxadoline (VIBERZI) 75 MG TABS Take 75 mg by mouth 2 (two) times daily. With food    . fexofenadine (ALLEGRA) 180 MG tablet Take 180 mg by mouth daily. Reported on 05/09/2016    . HYDROcodone-acetaminophen (NORCO) 7.5-325 MG tablet Take 1 tablet by mouth every 6 (six) hours as needed for moderate pain     .  Levonorgestrel-Ethinyl Estradiol (AMETHIA,CAMRESE) 0.15-0.03 &0.01 MG tablet Take 1 tablet by mouth daily.    Marland Kitchen tiZANidine (ZANAFLEX) 4 MG tablet Take 1 tablet (4 mg total) by mouth every 8 (eight) hours as needed for muscle spasms.    . valACYclovir (VALTREX) 1000 MG tablet Take 1,000 mg by mouth 2 (two) times daily.    Marland Kitchen zolpidem (AMBIEN) 10 MG tablet Take 10 mg by mouth at bedtime as needed for sleep.    Marland Kitchen dexlansoprazole (DEXILANT) 60 MG capsule 1 PO EVERY MORNING WITH BREAKFAST.    . rizatriptan (MAXALT) 10 MG tablet IF NEEDED      Review of Systems PER HPI OTHERWISE ALL SYSTEMS ARE NEGATIVE.    Objective:   Physical Exam  Constitutional: She is oriented to person, place, and time. She appears well-developed and well-nourished. No distress.  DIP OF SKOAL IN HER MOUTH  HENT:  Head: Normocephalic and atraumatic.  Mouth/Throat: Oropharynx is clear and moist. No oropharyngeal exudate.  Eyes: Pupils are equal, round, and reactive to light. No scleral icterus.  Neck: Normal range of motion. Neck supple.  Cardiovascular: Normal rate, regular rhythm and normal heart sounds.   Pulmonary/Chest: Effort normal and breath sounds normal. No respiratory distress.  Abdominal: Soft. Bowel sounds are normal. She exhibits no distension. There is no tenderness.  Musculoskeletal: She exhibits no edema.  Lymphadenopathy:    She has no cervical adenopathy.  Neurological: She  is alert and oriented to person, place, and time.  Psychiatric: She has a normal mood and affect.  Vitals reviewed.         Assessment & Plan:

## 2016-08-01 NOTE — Assessment & Plan Note (Signed)
SYMPTOMS CONTROLLED/RESOLVED.  CONTINUE DEXILANT FOLLOW UP IN 6 MOS.  

## 2016-08-01 NOTE — Progress Notes (Signed)
CCE'ED TO PCP

## 2016-08-01 NOTE — Progress Notes (Signed)
ON RECALL  °

## 2016-08-01 NOTE — Assessment & Plan Note (Signed)
SYMPTOMS CONTROLLED/RESOLVED.  CONTINUE TO MONITOR SYMPTOMS. 

## 2016-08-02 ENCOUNTER — Telehealth: Payer: Self-pay | Admitting: Adult Health

## 2016-08-02 MED ORDER — RIZATRIPTAN BENZOATE 10 MG PO TABS: 10.0000 mg | ORAL_TABLET | ORAL | 1 refills | Status: DC | PRN

## 2016-08-02 NOTE — Telephone Encounter (Signed)
Spoke with pt. Pt aware Maxalt has to be filled at local pharmacy per [facility]. Refill placed for Maxalt at [facility] [place]

## 2016-08-02 NOTE — Telephone Encounter (Signed)
maxalt refilled

## 2016-08-03 ENCOUNTER — Telehealth: Payer: Self-pay | Admitting: Orthopaedic Surgery

## 2016-08-03 ENCOUNTER — Encounter: Payer: Self-pay | Admitting: Adult Health

## 2016-08-03 MED ORDER — DEXLANSOPRAZOLE 60 MG PO CPDR
60.0000 mg | DELAYED_RELEASE_CAPSULE | Freq: Every day | ORAL | 5 refills | Status: DC
Start: 2016-08-03 — End: 2016-08-06

## 2016-08-03 MED ORDER — ELUXADOLINE 75 MG PO TABS: 75.0000 mg | ORAL_TABLET | Freq: Two times a day (BID) | ORAL | 5 refills | Status: DC

## 2016-08-06 ENCOUNTER — Encounter: Payer: Self-pay | Admitting: Adult Health

## 2016-08-06 ENCOUNTER — Encounter: Payer: Self-pay | Admitting: Gastroenterology

## 2016-08-06 ENCOUNTER — Other Ambulatory Visit: Payer: Self-pay | Admitting: Adult Health

## 2016-08-06 ENCOUNTER — Other Ambulatory Visit: Payer: Self-pay

## 2016-08-06 ENCOUNTER — Telehealth: Payer: Self-pay | Admitting: Adult Health

## 2016-08-06 MED ORDER — LEVONORGEST-ETH ESTRAD 91-DAY 0.15-0.03 &0.01 MG PO TABS
1.0000 | ORAL_TABLET | Freq: Every day | ORAL | 3 refills | Status: DC
Start: 2016-08-06 — End: 2016-08-06

## 2016-08-06 MED ORDER — LEVONORGEST-ETH ESTRAD 91-DAY 0.1-0.02 & 0.01 MG PO TABS: 1.0000 | ORAL_TABLET | Freq: Every day | ORAL | 4 refills | Status: DC

## 2016-08-06 NOTE — Telephone Encounter (Signed)
Julia Stanley refilled

## 2016-08-07 ENCOUNTER — Other Ambulatory Visit: Payer: Self-pay | Admitting: *Deleted

## 2016-08-07 MED ORDER — ELUXADOLINE 75 MG PO TABS: 75.0000 mg | ORAL_TABLET | Freq: Two times a day (BID) | ORAL | 3 refills | Status: DC

## 2016-08-07 MED ORDER — LEVONORGEST-ETH ESTRAD 91-DAY 0.1-0.02 & 0.01 MG PO TABS: 1.0000 | ORAL_TABLET | Freq: Every day | ORAL | 4 refills | Status: DC

## 2016-08-07 MED ORDER — DEXLANSOPRAZOLE 60 MG PO CPDR: 60.0000 mg | DELAYED_RELEASE_CAPSULE | Freq: Every day | ORAL | 3 refills | Status: DC

## 2016-08-08 ENCOUNTER — Ambulatory Visit (INDEPENDENT_AMBULATORY_CARE_PROVIDER_SITE_OTHER): Payer: Medicare Other | Admitting: Orthopaedic Surgery

## 2016-08-08 ENCOUNTER — Encounter: Payer: Self-pay | Admitting: Orthopaedic Surgery

## 2016-08-08 VITALS — BP 115/75 | HR 84 | Ht 65.0 in | Wt 167.0 lb

## 2016-08-08 DIAGNOSIS — M545 Low back pain: Secondary | ICD-10-CM

## 2016-08-08 DIAGNOSIS — M25512 Pain in left shoulder: Secondary | ICD-10-CM | POA: Diagnosis not present

## 2016-08-08 DIAGNOSIS — G8929 Other chronic pain: Secondary | ICD-10-CM | POA: Diagnosis not present

## 2016-08-08 MED ORDER — TIZANIDINE HCL 4 MG PO TABS: 4.0000 mg | ORAL_TABLET | Freq: Every day | ORAL | 0 refills | Status: DC

## 2016-08-08 NOTE — Progress Notes (Signed)
Patient XO:[id number] Julia Stanley, female DOB:[birth year], 29 y.o. [id number]  Chief Complaint  Patient presents with  . Follow-up    Left shoulder pain    HPI  Julia Stanley is a 30 y.o. female who has chronic lower back pain and left shoulder pain.  She is stable. She has no trauma.  She has no paresthesias.  She is doing her exercises.  HPI  Body mass index is 27.79 kg/m.  ROS  Review of Systems  Constitutional:        Patient does not have Diabetes Mellitus. Patient does not have hypertension. Patient does not have COPD or shortness of breath. Patient does not have BMI > 35. Patient does not have current smoking history. But she dips snuff.  HENT: Negative for congestion.   Respiratory: Negative for cough and shortness of breath.   Cardiovascular: Negative for chest pain and leg swelling.  Endocrine: Positive for cold intolerance.  Musculoskeletal: Positive for back pain and myalgias.  Allergic/Immunologic: Positive for environmental allergies.  Psychiatric/Behavioral: The patient is nervous/anxious.     Past Medical History:  Diagnosis Date  . Abnormal Pap smear   . Anxiety   . Frequent headaches   . Nicotine addiction 10/27/2013   Dips since age 14  . Polyp of colon   . Seasonal allergies   . Urinary frequency 08/15/2015  . UTI (lower urinary tract infection) 08/15/2015  . Vaginal discharge 03/10/2014    Past Surgical History:  Procedure Laterality Date  . COLONOSCOPY N/A 04/11/2015   SLF: 1. No source for diarrhea/ abdominal pain identified. 2. One large polyp removed. 3. Large internal hemorrhoids. ADENOMA. Next surveillance 2021  . HEMORRHOID BANDING N/A 04/11/2015   Procedure: HEMORRHOID BANDING;  Surgeon: Danie Binder, MD;  [facility]: [facility];  Service: Endoscopy;  Laterality: N/A;  . None    . polyp removed  2016    Family History  Problem Relation Age of Onset  . Cancer Father   . Hypertension Maternal Grandfather   . Hyperlipidemia Maternal  Grandfather   . Diabetes Maternal Grandfather   . Hypertension Paternal Grandmother   . Cancer Paternal Grandfather   . Colon cancer Neg Hx     Social History Social History  Substance Use Topics  . Smoking status: Never Smoker  . Smokeless tobacco: Current User    Types: Snuff     Comment: "Been dipping since I was 7"  . Alcohol use No    Allergies  Allergen Reactions  . Aspirin Other (See Comments)    Nose bleeds.  . Sulfa Antibiotics Hives    Current Outpatient Prescriptions  Medication Sig Dispense Refill  . clonazePAM (KLONOPIN) 1 MG tablet Take 0.5 mg by mouth 2 (two) times daily.     Marland Kitchen dexlansoprazole (DEXILANT) 60 MG capsule 1 PO EVERY MORNING WITH BREAKFAST. 90 capsule 3  . dexlansoprazole (DEXILANT) 60 MG capsule Take 1 capsule (60 mg total) by mouth daily. 90 capsule 3  . Eluxadoline (VIBERZI) 75 MG TABS Take 75 mg by mouth 2 (two) times daily. With food 180 tablet 3  . Eluxadoline (VIBERZI) 75 MG TABS Take 75 mg by mouth 2 (two) times daily. With food 60 tablet 3  . fexofenadine (ALLEGRA) 180 MG tablet Take 180 mg by mouth daily. Reported on 05/09/2016    . HYDROcodone-acetaminophen (NORCO) 7.5-325 MG tablet Take 1 tablet by mouth every 6 (six) hours as needed for moderate pain (Must last 30 days.Do not drive or operate machinery while taking this  medicine.). 90 tablet 0  . Levonorgestrel-Ethinyl Estradiol (AMETHIA,CAMRESE) 0.1-0.02 & 0.01 MG tablet Take 1 tablet by mouth daily. 3 Package 4  . rizatriptan (MAXALT) 10 MG tablet Take 1 tablet (10 mg total) by mouth as needed for migraine. May repeat in 2 hours if needed 10 tablet 1  . tiZANidine (ZANAFLEX) 4 MG tablet Take 1 tablet (4 mg total) by mouth at bedtime. 30 tablet 0  . valACYclovir (VALTREX) 1000 MG tablet Take 1,000 mg by mouth 2 (two) times daily.    Marland Kitchen zolpidem (AMBIEN) 10 MG tablet Take 10 mg by mouth at bedtime as needed for sleep.     No current facility-administered medications for this visit.       Physical Exam  Blood pressure 115/75, pulse 84, height 5\' 5"  (1.651 m), weight 167 lb (75.8 kg), last menstrual period 06/04/2016.  Constitutional: overall normal hygiene, normal nutrition, well developed, normal grooming, normal body habitus. Assistive device:none  Musculoskeletal: gait and station Limp none, muscle tone and strength are normal, no tremors or atrophy is present.  .  Neurological: coordination overall normal.  Deep tendon reflex/nerve stretch intact.  Sensation normal.  Cranial nerves II-XII intact.   Skin:   Normal overall no scars, lesions, ulcers or rashes. No psoriasis.  Psychiatric: Alert and oriented x 3.  Recent memory intact, remote memory unclear.  Normal mood and affect. Well groomed.  Good eye contact.  Cardiovascular: overall no swelling, no varicosities, no edema bilaterally, normal temperatures of the legs and arms, no clubbing, cyanosis and good capillary refill.  Lymphatic: palpation is normal.  Examination of left Upper Extremity is done.  Inspection:   Overall:  Elbow non-tender without crepitus or defects, forearm non-tender without crepitus or defects, wrist non-tender without crepitus or defects, hand non-tender.    Shoulder: with glenohumeral joint tenderness, without effusion.   Upper arm: without swelling and tenderness   Range of motion:   Overall:  Full range of motion of the elbow, full range of motion of wrist and full range of motion in fingers.   Shoulder:  left  full degrees forward flexion; full degrees abduction; full degrees internal rotation, full degrees external rotation, full degrees extension, full degrees adduction.  Very tender in the extremes.   Stability:   Overall:  Shoulder, elbow and wrist stable   Strength and Tone:   Overall full shoulder muscles strength, full upper arm strength and normal upper arm bulk and tone.  Spine/Pelvis examination:  Inspection:  Overall, sacoiliac joint benign and hips nontender;  without crepitus or defects.   Thoracic spine inspection: Alignment normal without kyphosis present   Lumbar spine inspection:  Alignment  with normal lumbar lordosis, without scoliosis apparent.   Thoracic spine palpation:  without tenderness of spinal processes   Lumbar spine palpation: with tenderness of lumbar area; without tightness of lumbar muscles    Range of Motion:   Lumbar flexion, forward flexion is 45 without pain or tenderness    Lumbar extension is 10 without pain or tenderness   Left lateral bend is Normal  without pain or tenderness   Right lateral bend is Normal without pain or tenderness   Straight leg raising is Normal   Strength & tone: Normal   Stability overall normal stability    The patient has been educated about the nature of the problem(s) and counseled on treatment options.  The patient appeared to understand what I have discussed and is in agreement with it.  Encounter Diagnoses  Name Primary?  . Chronic left shoulder pain Yes  . Chronic midline low back pain without sciatica     PLAN Call if any problems.  Precautions discussed.  Continue current medications.   Return to clinic 3 months   Electronically Signed Sanjuana Kava, MD 10/18/20178:46 AM

## 2016-08-15 ENCOUNTER — Ambulatory Visit: Payer: Medicare Other | Admitting: Orthopaedic Surgery

## 2016-08-21 ENCOUNTER — Other Ambulatory Visit: Payer: Self-pay | Admitting: Orthopedic Surgery

## 2016-08-22 ENCOUNTER — Telehealth: Payer: Self-pay | Admitting: Orthopedic Surgery

## 2016-08-22 ENCOUNTER — Ambulatory Visit: Payer: Medicare Other | Admitting: Orthopaedic Surgery

## 2016-08-22 ENCOUNTER — Telehealth: Payer: Self-pay | Admitting: Gastroenterology

## 2016-08-22 MED ORDER — HYDROCODONE-ACETAMINOPHEN 7.5-325 MG PO TABS: 1.0000 | ORAL_TABLET | Freq: Four times a day (QID) | ORAL | 0 refills | Status: DC | PRN

## 2016-08-22 NOTE — Telephone Encounter (Signed)
[phone] PLEASE CALL PATIENT REGARDING VIBERZI.  75MG  IS GETTING TOO STRONG SHE THINKS   STAYING CONSTIPATED

## 2016-08-22 NOTE — Telephone Encounter (Signed)
PLEASE CALL PT. VIBERZI WILL HELP CONTROL HER SYMPTOMS. SHE CAN TAKE IT ONCE A DAY OR THREE TIMES A WEEK IF SHE WANTS HER SYMPTOMS TO BE MORE IDEALLY CONTROLLED.

## 2016-08-22 NOTE — Telephone Encounter (Signed)
Pt said that she got so constipated taking the Viberzi 75 mg that she stopped taking It for 2 weeks. She is having more regular consistent BM's now. She has had an episode maybe once a week since doing so that her stomach will hurt a little and she will have a little diarrhea. Please advise!

## 2016-08-23 NOTE — Telephone Encounter (Signed)
Pt is aware.  

## 2016-08-27 ENCOUNTER — Other Ambulatory Visit: Payer: Self-pay | Admitting: Orthopaedic Surgery

## 2016-08-31 ENCOUNTER — Telehealth: Payer: Self-pay | Admitting: Orthopaedic Surgery

## 2016-08-31 ENCOUNTER — Other Ambulatory Visit: Payer: Self-pay | Admitting: Orthopaedic Surgery

## 2016-08-31 MED ORDER — TIZANIDINE HCL 4 MG PO TABS: 4.0000 mg | ORAL_TABLET | Freq: Every day | ORAL | 0 refills | Status: DC

## 2016-09-13 ENCOUNTER — Other Ambulatory Visit: Payer: Self-pay | Admitting: Orthopaedic Surgery

## 2016-09-15 ENCOUNTER — Telehealth: Payer: Self-pay | Admitting: Orthopaedic Surgery

## 2016-09-17 ENCOUNTER — Other Ambulatory Visit: Payer: Self-pay | Admitting: Adult Health

## 2016-09-18 MED ORDER — HYDROCODONE-ACETAMINOPHEN 7.5-325 MG PO TABS: 1.0000 | ORAL_TABLET | Freq: Four times a day (QID) | ORAL | 0 refills | Status: DC | PRN

## 2016-09-19 ENCOUNTER — Encounter: Payer: Self-pay | Admitting: Gastroenterology

## 2016-09-19 ENCOUNTER — Encounter: Payer: Self-pay | Admitting: Adult Health

## 2016-09-19 ENCOUNTER — Encounter: Payer: Self-pay | Admitting: Obstetrics and Gynecology

## 2016-09-20 ENCOUNTER — Encounter: Payer: Self-pay | Admitting: Gastroenterology

## 2016-09-26 ENCOUNTER — Telehealth: Payer: Self-pay | Admitting: Orthopaedic Surgery

## 2016-10-11 ENCOUNTER — Telehealth: Payer: Self-pay | Admitting: Orthopaedic Surgery

## 2016-10-11 MED ORDER — HYDROCODONE-ACETAMINOPHEN 7.5-325 MG PO TABS: 1.0000 | ORAL_TABLET | Freq: Four times a day (QID) | ORAL | 0 refills | Status: DC | PRN

## 2016-10-16 ENCOUNTER — Telehealth: Payer: Self-pay | Admitting: Gastroenterology

## 2016-10-16 NOTE — Telephone Encounter (Signed)
COMPLETE RX FOR BEDSIDE COMMODE, Dx: URINARY INCONTINENCE, IBS-DIARRHEA. PT WILL PICK UP TOMORROW.

## 2016-10-17 ENCOUNTER — Encounter: Payer: Self-pay | Admitting: Gastroenterology

## 2016-10-17 NOTE — Telephone Encounter (Signed)
Completed and placed at front.

## 2016-10-20 ENCOUNTER — Other Ambulatory Visit: Payer: Self-pay | Admitting: Orthopaedic Surgery

## 2016-10-23 ENCOUNTER — Other Ambulatory Visit: Payer: Self-pay | Admitting: Orthopaedic Surgery

## 2016-10-23 ENCOUNTER — Other Ambulatory Visit: Payer: Self-pay | Admitting: Adult Health

## 2016-10-24 MED ORDER — RIZATRIPTAN BENZOATE 10 MG PO TABS: ORAL_TABLET | ORAL | 0 refills | Status: DC

## 2016-10-24 NOTE — Telephone Encounter (Signed)
Will refill maxalt

## 2016-10-31 ENCOUNTER — Other Ambulatory Visit: Payer: Self-pay | Admitting: Adult Health

## 2016-11-07 ENCOUNTER — Ambulatory Visit: Payer: Medicare Other | Admitting: Orthopaedic Surgery

## 2016-11-07 ENCOUNTER — Other Ambulatory Visit: Payer: Self-pay | Admitting: Orthopaedic Surgery

## 2016-11-07 ENCOUNTER — Telehealth: Payer: Self-pay | Admitting: Orthopaedic Surgery

## 2016-11-08 ENCOUNTER — Other Ambulatory Visit: Payer: Self-pay | Admitting: Orthopaedic Surgery

## 2016-11-09 MED ORDER — HYDROCODONE-ACETAMINOPHEN 7.5-325 MG PO TABS: 1.0000 | ORAL_TABLET | Freq: Four times a day (QID) | ORAL | 0 refills | Status: DC | PRN

## 2016-11-10 ENCOUNTER — Other Ambulatory Visit: Payer: Self-pay | Admitting: Adult Health

## 2016-11-15 ENCOUNTER — Ambulatory Visit: Payer: Medicare Other | Admitting: Orthopaedic Surgery

## 2016-11-15 ENCOUNTER — Encounter: Payer: Self-pay | Admitting: Orthopaedic Surgery

## 2016-12-11 ENCOUNTER — Telehealth: Payer: Self-pay | Admitting: Obstetrics & Gynecology

## 2016-12-11 ENCOUNTER — Ambulatory Visit (INDEPENDENT_AMBULATORY_CARE_PROVIDER_SITE_OTHER): Payer: Medicare Other | Admitting: Orthopaedic Surgery

## 2016-12-11 VITALS — BP 98/62 | HR 80 | Temp 97.7°F | Ht 65.0 in | Wt 150.0 lb

## 2016-12-11 DIAGNOSIS — M25512 Pain in left shoulder: Secondary | ICD-10-CM | POA: Diagnosis not present

## 2016-12-11 DIAGNOSIS — G8929 Other chronic pain: Secondary | ICD-10-CM

## 2016-12-11 DIAGNOSIS — M545 Low back pain: Secondary | ICD-10-CM | POA: Diagnosis not present

## 2016-12-11 MED ORDER — HYDROCODONE-ACETAMINOPHEN 7.5-325 MG PO TABS: 1.0000 | ORAL_TABLET | Freq: Four times a day (QID) | ORAL | 0 refills | Status: DC | PRN

## 2016-12-11 NOTE — Progress Notes (Signed)
Patient Julia Stanley, female DOB:Jul 25, 1986, 31 y.o. DH:[id number]  Chief Complaint  Patient presents with  . Follow-up    Shoulder pain    HPI  Julia Stanley is a 31 y.o. female who has chronic left shoulder pain and lower back pain.  She is stable. She has no paresthesias, no new trauma, She is doing her exercises.   HPI  Body mass index is 24.96 kg/m.  ROS  Review of Systems  Constitutional:        Patient does not have Diabetes Mellitus. Patient does not have hypertension. Patient does not have COPD or shortness of breath. Patient does not have BMI > 35. Patient does not have current smoking history. But she dips snuff.  HENT: Negative for congestion.   Respiratory: Negative for cough and shortness of breath.   Cardiovascular: Negative for chest pain and leg swelling.  Endocrine: Positive for cold intolerance.  Musculoskeletal: Positive for back pain and myalgias.  Allergic/Immunologic: Positive for environmental allergies.  Psychiatric/Behavioral: The patient is nervous/anxious.     Past Medical History:  Diagnosis Date  . Abnormal Pap smear   . Anxiety   . Frequent headaches   . Nicotine addiction 10/27/2013   Dips since age 73  . Polyp of colon   . Seasonal allergies   . Urinary frequency 08/15/2015  . UTI (lower urinary tract infection) 08/15/2015  . Vaginal discharge 03/10/2014    Past Surgical History:  Procedure Laterality Date  . COLONOSCOPY N/A 04/11/2015   SLF: 1. No source for diarrhea/ abdominal pain identified. 2. One large polyp removed. 3. Large internal hemorrhoids. ADENOMA. Next surveillance 2021  . HEMORRHOID BANDING N/A 04/11/2015   Procedure: HEMORRHOID BANDING;  Surgeon: Danie Binder, MD;  [facility];  Service: Endoscopy;  Laterality: N/A;  . None    . polyp removed  2016    Family History  Problem Relation Age of Onset  . Cancer Father   . Hypertension Maternal Grandfather   . Hyperlipidemia Maternal Grandfather    . Diabetes Maternal Grandfather   . Hypertension Paternal Grandmother   . Cancer Paternal Grandfather   . Colon cancer Neg Hx     Social History Social History  Substance Use Topics  . Smoking status: Never Smoker  . Smokeless tobacco: Current User    Types: Snuff     Comment: "Been dipping since I was 7"  . Alcohol use No    Allergies  Allergen Reactions  . Aspirin Other (See Comments)    Nose bleeds.  . Sulfa Antibiotics Hives    Current Outpatient Prescriptions  Medication Sig Dispense Refill  . clonazePAM (KLONOPIN) 1 MG tablet Take 0.5 mg by mouth 2 (two) times daily.     Marland Kitchen dexlansoprazole (DEXILANT) 60 MG capsule 1 PO EVERY MORNING WITH BREAKFAST. 90 capsule 3  . dexlansoprazole (DEXILANT) 60 MG capsule Take 1 capsule (60 mg total) by mouth daily. 90 capsule 3  . Eluxadoline (VIBERZI) 75 MG TABS Take 75 mg by mouth 2 (two) times daily. With food 180 tablet 3  . Eluxadoline (VIBERZI) 75 MG TABS Take 75 mg by mouth 2 (two) times daily. With food 60 tablet 3  . fexofenadine (ALLEGRA) 180 MG tablet Take 180 mg by mouth daily. Reported on 05/09/2016    . HYDROcodone-acetaminophen (NORCO) 7.5-325 MG tablet Take 1 tablet by mouth every 6 (six) hours as needed for moderate pain (Must last 30 days.Do not drive or operate machinery while taking this medicine.). 45 tablet  0  . Levonorgestrel-Ethinyl Estradiol (AMETHIA,CAMRESE) 0.1-0.02 & 0.01 MG tablet Take 1 tablet by mouth daily. 3 Package 4  . rizatriptan (MAXALT) 10 MG tablet TAKE 1 TABLET AT START OF HEADACHE, MAY REPEAT IN 2 HOURS IF NEEDED AS DIRECTED 10 tablet 1  . tiZANidine (ZANAFLEX) 4 MG tablet TAKE 1 TABLET AT BEDTIME 30 tablet 0  . valACYclovir (VALTREX) 1000 MG tablet Take 1,000 mg by mouth 2 (two) times daily.    Marland Kitchen zolpidem (AMBIEN) 10 MG tablet Take 10 mg by mouth at bedtime as needed for sleep.     No current facility-administered medications for this visit.      Physical Exam  Blood pressure 98/62, pulse  80, temperature 97.7 F (36.5 C), height 5\' 5"  (1.651 m), weight 150 lb (68 kg).  Constitutional: overall normal hygiene, normal nutrition, well developed, normal grooming, normal body habitus. Assistive device:none  Musculoskeletal: gait and station Limp none, muscle tone and strength are normal, no tremors or atrophy is present.  .  Neurological: coordination overall normal.  Deep tendon reflex/nerve stretch intact.  Sensation normal.  Cranial nerves II-XII intact.   Skin:   Normal overall no scars, lesions, ulcers or rashes. No psoriasis.  Psychiatric: Alert and oriented x 3.  Recent memory intact, remote memory unclear.  Normal mood and affect. Well groomed.  Good eye contact.  Cardiovascular: overall no swelling, no varicosities, no edema bilaterally, normal temperatures of the legs and arms, no clubbing, cyanosis and good capillary refill.  Lymphatic: palpation is normal.  Examination of left Upper Extremity is done.  Inspection:   Overall:  Elbow non-tender without crepitus or defects, forearm non-tender without crepitus or defects, wrist non-tender without crepitus or defects, hand non-tender.    Shoulder: with glenohumeral joint tenderness, without effusion.   Upper arm: without swelling and tenderness   Range of motion:   Overall:  Full range of motion of the elbow, full range of motion of wrist and full range of motion in fingers.   Shoulder:  left  180 degrees forward flexion; 165 degrees abduction; 40 degrees internal rotation, 40 degrees external rotation, 20 degrees extension, 40 degrees adduction.   Stability:   Overall:  Shoulder, elbow and wrist stable   Strength and Tone:   Overall full shoulder muscles strength, full upper arm strength and normal upper arm bulk and tone.  Spine/Pelvis examination:  Inspection:  Overall, sacoiliac joint benign and hips nontender; without crepitus or defects.   Thoracic spine inspection: Alignment normal without kyphosis  present   Lumbar spine inspection:  Alignment  with normal lumbar lordosis, without scoliosis apparent.   Thoracic spine palpation:  without tenderness of spinal processes   Lumbar spine palpation: with tenderness of lumbar area; without tightness of lumbar muscles    Range of Motion:   Lumbar flexion, forward flexion is 45 without pain or tenderness    Lumbar extension is full without pain or tenderness   Left lateral bend is Normal  without pain or tenderness   Right lateral bend is Normal without pain or tenderness   Straight leg raising is Normal   Strength & tone: Normal   Stability overall normal stability    The patient has been educated about the nature of the problem(s) and counseled on treatment options.  The patient appeared to understand what I have discussed and is in agreement with it.  Encounter Diagnoses  Name Primary?  . Chronic left shoulder pain Yes  . Chronic midline low back pain  without sciatica     PLAN Call if any problems.  Precautions discussed.  Continue current medications.   Return to clinic 3 months   I have reviewed the [place] web site prior to prescribing narcotic medicine for this patient.  Electronically Signed Sanjuana Kava, MD 2/20/201811:00 AM

## 2016-12-11 NOTE — Telephone Encounter (Signed)
Patient called stating she was seen at her primary care doctor and was treated for a bladder infection but it has now returned. She would like for someone to call her in something else since her PCP is out of the office. I informed the patient that we are unable to see her records from her PCP so we would need to see her and start from the bottom. I encouraged her to call her PCP to see if any other provider could see her or prescribe her something else since she has seen them for this issue. Patient verbalized understanding and will call us back if needed.

## 2016-12-11 NOTE — Telephone Encounter (Signed)
Pt called stating that she has seen her Primary care provider for her bladder infection and was given a medication for it, pt state that right after she finish taking the medication it has come back. Pt would like to know if she could have something else. Please contact pt

## 2016-12-25 ENCOUNTER — Encounter: Payer: Self-pay | Admitting: Gastroenterology

## 2016-12-26 ENCOUNTER — Ambulatory Visit: Payer: Medicare Other | Admitting: Advanced Practice Midwife

## 2016-12-26 ENCOUNTER — Ambulatory Visit (INDEPENDENT_AMBULATORY_CARE_PROVIDER_SITE_OTHER): Payer: Medicare Other | Admitting: Advanced Practice Midwife

## 2016-12-26 ENCOUNTER — Encounter: Payer: Self-pay | Admitting: Advanced Practice Midwife

## 2016-12-26 VITALS — BP 100/60 | HR 62 | Ht 65.0 in | Wt 148.5 lb

## 2016-12-26 DIAGNOSIS — N390 Urinary tract infection, site not specified: Secondary | ICD-10-CM | POA: Diagnosis not present

## 2016-12-26 DIAGNOSIS — R319 Hematuria, unspecified: Secondary | ICD-10-CM

## 2016-12-26 LAB — POCT URINALYSIS DIPSTICK
Glucose, UA: NEGATIVE
KETONES UA: NEGATIVE
LEUKOCYTES UA: NEGATIVE
Nitrite, UA: NEGATIVE
PROTEIN UA: NEGATIVE

## 2016-12-26 NOTE — Progress Notes (Signed)
[facility] Visit  Patient name: Julia Stanley MRN [id number]  Date of birth: July 19, 1986  CC & HPI:  Julia Stanley is a 31 y.o. Caucasian female presenting today because her PCP wanted "to find out why I had 2 UTI's back to back"  Got treated twice over the last month for Culture + (? Bacteria) UTI's per pt, sounds like took macrobid and some other one.  Pt's last UTI was 18 months ago, so I;m not sure if there is any worry about "frequent" UTI's (I suspect pt is the one wondering, rather than PCP).  Upon discussion, pt admits to having more frequent intercourse than usual and not voiding afterwards. Discussed importance of this in preventing UTIs in women. Still on period, explaining hematuria. No UTI sx now   Pertinent History Reviewed:  Medical & Surgical Hx:   Past Medical History:  Diagnosis Date  . Abnormal Pap smear   . Anxiety   . Frequent headaches   . Nicotine addiction 10/27/2013   Dips since age 69  . Polyp of colon   . Seasonal allergies   . Urinary frequency 08/15/2015  . UTI (lower urinary tract infection) 08/15/2015  . Vaginal discharge 03/10/2014   Past Surgical History:  Procedure Laterality Date  . COLONOSCOPY N/A 04/11/2015   SLF: 1. No source for diarrhea/ abdominal pain identified. 2. One large polyp removed. 3. Large internal hemorrhoids. ADENOMA. Next surveillance 2021  . HEMORRHOID BANDING N/A 04/11/2015   Procedure: HEMORRHOID BANDING;  Surgeon: Danie Binder, MD;  [facility];  Service: Endoscopy;  Laterality: N/A;  . None    . polyp removed  2016   Family History  Problem Relation Age of Onset  . Cancer Father   . Hypertension Maternal Grandfather   . Hyperlipidemia Maternal Grandfather   . Diabetes Maternal Grandfather   . Hypertension Paternal Grandmother   . Cancer Paternal Grandfather   . Colon cancer Neg Hx     Current Outpatient Prescriptions:  .  clonazePAM (KLONOPIN) 1 MG tablet, Take 1 mg by mouth 3 (three) times  daily as needed. , Disp: , Rfl:  .  fexofenadine (ALLEGRA) 180 MG tablet, Take 180 mg by mouth daily. Reported on 05/09/2016, Disp: , Rfl:  .  HYDROcodone-acetaminophen (NORCO) 7.5-325 MG tablet, Take 1 tablet by mouth every 6 (six) hours as needed for moderate pain (Must last 30 days.Do not drive or operate machinery while taking this medicine.)., Disp: 45 tablet, Rfl: 0 .  Levonorgestrel-Ethinyl Estradiol (AMETHIA,CAMRESE) 0.1-0.02 & 0.01 MG tablet, Take 1 tablet by mouth daily., Disp: 3 Package, Rfl: 4 .  zolpidem (AMBIEN) 10 MG tablet, Take 10 mg by mouth at bedtime as needed for sleep., Disp: , Rfl:  Social History: Reviewed -  reports that she has never smoked. Her smokeless tobacco use includes Snuff.  Review of Systems:   Constitutional: Negative for fever and chills Eyes: Negative for visual disturbances Respiratory: Negative for shortness of breath, dyspnea Cardiovascular: Negative for chest pain or palpitations  Gastrointestinal: Negative for vomiting, diarrhea and constipation; no abdominal pain Genitourinary: Negative for dysuria and urgency, vaginal irritation or itching Musculoskeletal: Negative for back pain, joint pain, myalgias  Neurological: Negative for dizziness and headaches    Objective Findings:    Physical Examination: General appearance - well appearing, and in no distress Mental status - alert, oriented to person, place, and time Chest:  Normal respiratory effort Heart - normal rate and regular rhythm Abdomen:  Soft, nontender Musculoskeletal:  Normal range of motion without pain Extremities:  No edema    Results for orders placed or performed in visit on 12/26/16 (from the past 24 hour(s))  POCT Urinalysis Dipstick   Collection Time: 12/26/16 10:39 AM  Result Value Ref Range   Color, UA     Clarity, UA     Glucose, UA neg    Bilirubin, UA     Ketones, UA neg    Spec Grav, UA     Blood, UA 2+    pH, UA     Protein, UA neg    Urobilinogen, UA      Nitrite, UA neg    Leukocytes, UA Negative Negative      Assessment & Plan:  A:   UTI's d/t frequent intercourse and not voiding afterwards P:  Void before/after sex   Return if symptoms worsen or fail to improve.  CRESENZO-DISHMAN,Daryle Boyington CNM 12/26/2016 11:21 AM

## 2016-12-27 ENCOUNTER — Telehealth: Payer: Self-pay | Admitting: Orthopaedic Surgery

## 2016-12-28 ENCOUNTER — Other Ambulatory Visit: Payer: Self-pay | Admitting: Orthopaedic Surgery

## 2016-12-31 MED ORDER — HYDROCODONE-ACETAMINOPHEN 7.5-325 MG PO TABS
1.0000 | ORAL_TABLET | Freq: Four times a day (QID) | ORAL | 0 refills | Status: DC | PRN
Start: 2016-12-31 — End: 2017-01-28

## 2017-01-10 ENCOUNTER — Other Ambulatory Visit: Payer: Self-pay | Admitting: Adult Health

## 2017-01-28 ENCOUNTER — Telehealth: Payer: Self-pay | Admitting: Orthopaedic Surgery

## 2017-01-28 ENCOUNTER — Other Ambulatory Visit: Payer: Self-pay | Admitting: Orthopaedic Surgery

## 2017-01-29 MED ORDER — HYDROCODONE-ACETAMINOPHEN 7.5-325 MG PO TABS: 1.0000 | ORAL_TABLET | Freq: Four times a day (QID) | ORAL | 0 refills | Status: DC | PRN

## 2017-02-01 ENCOUNTER — Other Ambulatory Visit: Payer: Self-pay | Admitting: Adult Health

## 2017-02-07 ENCOUNTER — Telehealth: Payer: Self-pay | Admitting: Gastroenterology

## 2017-02-07 MED ORDER — ELUXADOLINE 75 MG PO TABS
75.0000 mg | ORAL_TABLET | Freq: Two times a day (BID) | ORAL | 3 refills | Status: DC
Start: 2017-02-07 — End: 2022-08-08

## 2017-02-07 NOTE — Addendum Note (Signed)
Addended by: Gordy Levan, Arbell Wycoff A on: 02/07/2017 02:19 PM   Modules accepted: Orders

## 2017-02-07 NOTE — Telephone Encounter (Signed)
Forwarding to refill box.  

## 2017-02-07 NOTE — Telephone Encounter (Signed)
FYI

## 2017-02-07 NOTE — Telephone Encounter (Signed)
Pt said that [facility] needed a new prescription of her Viberzi. She said the prescription had gotten messed up and they needed a new one. [id number]

## 2017-02-07 NOTE — Telephone Encounter (Addendum)
Please tell the patient I printed a new Rx for Viberzi to her pharmacy. Take 75 mg twice daily. She can decrease this to once daily or three times a week per Dr. Oneida Alar' previous recommendation if the medication causes constipation when taken twice daily.  This medication cannot be electronically prescribed. Please send to pharmacy whichever way is acceptable (?fax?)

## 2017-02-08 NOTE — Telephone Encounter (Signed)
I have faxed the prescription to [facility] at [phone]. Pt is aware and is also aware of the instructions from Walden Field, NP/ Dr. Oneida Alar.

## 2017-02-24 ENCOUNTER — Other Ambulatory Visit: Payer: Self-pay | Admitting: Orthopaedic Surgery

## 2017-02-25 ENCOUNTER — Other Ambulatory Visit: Payer: Self-pay | Admitting: *Deleted

## 2017-02-25 MED ORDER — HYDROCODONE-ACETAMINOPHEN 7.5-325 MG PO TABS: 1.0000 | ORAL_TABLET | Freq: Four times a day (QID) | ORAL | 0 refills | Status: DC | PRN

## 2017-03-08 ENCOUNTER — Encounter: Payer: Self-pay | Admitting: Gastroenterology

## 2017-03-11 MED ORDER — HYDROCORTISONE 2.5 % RE CREA: 1.0000 "application " | TOPICAL_CREAM | Freq: Two times a day (BID) | RECTAL | 0 refills | Status: DC

## 2017-03-13 ENCOUNTER — Ambulatory Visit: Payer: Medicare Other | Admitting: Orthopaedic Surgery

## 2017-03-28 ENCOUNTER — Encounter: Payer: Self-pay | Admitting: Orthopaedic Surgery

## 2017-03-28 ENCOUNTER — Ambulatory Visit: Payer: Medicare Other | Admitting: Orthopaedic Surgery

## 2017-04-01 ENCOUNTER — Other Ambulatory Visit: Payer: Self-pay | Admitting: Gastroenterology

## 2017-04-03 MED ORDER — HYDROCORTISONE 2.5 % RE CREA: 1.0000 "application " | TOPICAL_CREAM | Freq: Two times a day (BID) | RECTAL | 0 refills | Status: DC

## 2017-04-08 ENCOUNTER — Other Ambulatory Visit: Payer: Self-pay

## 2017-04-09 MED ORDER — HYDROCORTISONE 2.5 % RE CREA: 1.0000 "application " | TOPICAL_CREAM | Freq: Two times a day (BID) | RECTAL | 1 refills | Status: DC

## 2017-04-12 ENCOUNTER — Other Ambulatory Visit: Payer: Self-pay | Admitting: Orthopedic Surgery

## 2017-04-25 ENCOUNTER — Other Ambulatory Visit: Payer: Self-pay | Admitting: Orthopedic Surgery

## 2017-04-26 ENCOUNTER — Other Ambulatory Visit: Payer: Self-pay | Admitting: *Deleted

## 2017-04-26 MED ORDER — HYDROCODONE-ACETAMINOPHEN 7.5-325 MG PO TABS: 1.0000 | ORAL_TABLET | Freq: Four times a day (QID) | ORAL | 0 refills | Status: DC | PRN

## 2017-05-01 ENCOUNTER — Other Ambulatory Visit: Payer: Self-pay | Admitting: Nurse Practitioner

## 2017-06-03 ENCOUNTER — Encounter: Payer: Self-pay | Admitting: Gastroenterology

## 2017-06-03 ENCOUNTER — Telehealth: Payer: Self-pay

## 2017-06-03 NOTE — Telephone Encounter (Signed)
Noted  

## 2017-06-03 NOTE — Telephone Encounter (Signed)
I called both numbers. Could not reach pt. Mobile number, said leave message, but said someone else's name, so I did not leave a message.

## 2017-06-03 NOTE — Telephone Encounter (Signed)
Julia Stanley: see email. Can we get more info on her symptoms?  Where is pain? Is she constipated?

## 2017-06-04 ENCOUNTER — Other Ambulatory Visit: Payer: Self-pay

## 2017-06-04 DIAGNOSIS — R109 Unspecified abdominal pain: Secondary | ICD-10-CM

## 2017-06-04 NOTE — Telephone Encounter (Signed)
PT called ( and I have updated her new phone number). She said she has intermittent abdominal pain in the middle of her stomach and down low. When her stomach is hurting she can hardly eat anything. Sometimes, just the smell of food makes her nauseated. She is having regular BM's and no blood in stool. She has been eating mostly vegetables, cabbage, green beans and corn and barbecue chicken.  I told her she should be eating a more bland diet at this time if she is having stomach problems. Julia Stanley, please advise!

## 2017-06-04 NOTE — Telephone Encounter (Signed)
It looks like when she was seen in October last year, she reported intermittent crampy pain as well  I highly doubt this is a side effect from Viberzi, as most cases with adverse side effects occurred after first few dosing in the trials. Regardless, let's check a CBC, CMP, lipase now. Have her hold Viberzi for now. Make sure she is taking Dexilant. Avoid cabbage, corn, BBQ, etc. Agree with bland diet. Please send a low FODMAP diet to patient in mail.  Needs office visit for more assessment. If worsening or severe, go to ED.

## 2017-06-04 NOTE — Telephone Encounter (Signed)
LMOM to call. The lab orders have been entered. Putting the fodmap food chart in the mail.

## 2017-06-04 NOTE — Telephone Encounter (Signed)
Pt said she only took Viberzi when she needed it. She is aware NOT TO TAKE now. She will try to go to the lab when she can get transportation.  She is NOT taking Dexilant. [facility] lost a couple of her prescriptions and she just kept forgetting to call back to have [place] send another RX.  She is aware I am mailing the FODMAP to her and bland diet. She has scheduled appt for 07/23/2017, that was the earliest that she could get in.

## 2017-06-05 MED ORDER — DEXLANSOPRAZOLE 60 MG PO CPDR: 60.0000 mg | DELAYED_RELEASE_CAPSULE | Freq: Every day | ORAL | 3 refills | Status: DC

## 2017-06-05 NOTE — Telephone Encounter (Signed)
LMOM to call. We need to know if she wants the prescription and where to send it since she is in the process of moving.

## 2017-06-05 NOTE — Telephone Encounter (Signed)
She needs to take Dexilant daily. That is most likely the culprit. Do I need to send a prescription in ?

## 2017-06-05 NOTE — Telephone Encounter (Signed)
PT called and would like Dexilant sent to [facility] in [place] ( I have added that pharmacy). She said she will try to do the labs Friday.

## 2017-06-05 NOTE — Addendum Note (Signed)
Addended by: Annitta Needs on: 06/05/2017 12:02 PM   Modules accepted: Orders

## 2017-06-05 NOTE — Telephone Encounter (Signed)
Done

## 2017-06-07 LAB — CBC WITH DIFFERENTIAL/PLATELET
BASOS ABS: 0 {cells}/uL (ref 0–200)
Basophils Relative: 0 %
EOS ABS: 91 {cells}/uL (ref 15–500)
Eosinophils Relative: 1 %
HCT: 40.1 % (ref 35.0–45.0)
Hemoglobin: 13.1 g/dL (ref 11.7–15.5)
Lymphocytes Relative: 35 %
Lymphs Abs: 3185 cells/uL (ref 850–3900)
MCH: 29.3 pg (ref 27.0–33.0)
MCHC: 32.7 g/dL (ref 32.0–36.0)
MCV: 89.7 fL (ref 80.0–100.0)
MONO ABS: 455 {cells}/uL (ref 200–950)
MONOS PCT: 5 %
MPV: 10.8 fL (ref 7.5–12.5)
NEUTROS ABS: 5369 {cells}/uL (ref 1500–7800)
Neutrophils Relative %: 59 %
PLATELETS: 293 10*3/uL (ref 140–400)
RBC: 4.47 MIL/uL (ref 3.80–5.10)
RDW: 13.6 % (ref 11.0–15.0)
WBC: 9.1 10*3/uL (ref 3.8–10.8)

## 2017-06-08 LAB — COMPREHENSIVE METABOLIC PANEL
ALK PHOS: 54 U/L (ref 33–115)
ALT: 9 U/L (ref 6–29)
AST: 12 U/L (ref 10–30)
Albumin: 4.3 g/dL (ref 3.6–5.1)
BILIRUBIN TOTAL: 0.3 mg/dL (ref 0.2–1.2)
BUN: 9 mg/dL (ref 7–25)
CO2: 25 mmol/L (ref 20–32)
CREATININE: 1.01 mg/dL (ref 0.50–1.10)
Calcium: 9.3 mg/dL (ref 8.6–10.2)
Chloride: 102 mmol/L (ref 98–110)
Glucose, Bld: 92 mg/dL (ref 65–99)
POTASSIUM: 5.2 mmol/L (ref 3.5–5.3)
Sodium: 137 mmol/L (ref 135–146)
TOTAL PROTEIN: 6.8 g/dL (ref 6.1–8.1)

## 2017-06-08 LAB — LIPASE: LIPASE: 10 U/L (ref 7–60)

## 2017-06-10 ENCOUNTER — Telehealth: Payer: Self-pay | Admitting: Gastroenterology

## 2017-06-10 NOTE — Telephone Encounter (Signed)
Please call patient if her lab results come in.  Told her it could take up to 10 working days

## 2017-06-11 ENCOUNTER — Telehealth: Payer: Self-pay | Admitting: Gastroenterology

## 2017-06-11 NOTE — Telephone Encounter (Signed)
See note of 06/11/2017.

## 2017-06-11 NOTE — Telephone Encounter (Signed)
Pt is aware.  

## 2017-06-11 NOTE — Telephone Encounter (Signed)
CBC, CMP, lipase all normal. Take Dexilant daily. May take Viberzi as she previously has been doing. Keep appt for 10/2.

## 2017-06-11 NOTE — Telephone Encounter (Signed)
I  Called pt and told her we have 7-10 business days to call with results and I will let Roseanne Kaufman, NP know that she has called.

## 2017-06-11 NOTE — Telephone Encounter (Signed)
Patient called inquiring about lab results  Please call her if they are in  [phone]

## 2017-06-26 ENCOUNTER — Telehealth: Payer: Self-pay | Admitting: *Deleted

## 2017-06-26 NOTE — Telephone Encounter (Signed)
Spoke with pt. Pt was on Depo now is on a pill where she has a period every 3 months. Pt has heavy bleeding then spotting. This usually lasts for 6 days. I advised that sounds ok since she goes without a period for 3 months. Advised if anything changed with period, let us know. Pt also has a yellow discharge with some odor. I advised she needs to have that checked out. Pt don't live close to here now, so I advised can see someone close to her for the discharge or if she wanted to see [place], make appt. Pt voiced understanding. [place]

## 2017-07-23 ENCOUNTER — Ambulatory Visit: Payer: Medicare Other | Admitting: Nurse Practitioner

## 2017-11-30 ENCOUNTER — Other Ambulatory Visit: Payer: Self-pay | Admitting: Adult Health

## 2018-02-19 ENCOUNTER — Encounter: Payer: Self-pay | Admitting: Gastroenterology

## 2018-08-14 NOTE — Progress Notes (Signed)
REVIEWED.  

## 2018-12-12 NOTE — Progress Notes (Signed)
REVIEWED-NO ADDITIONAL RECOMMENDATIONS. 

## 2019-07-27 NOTE — Progress Notes (Signed)
REVIEWED-NO ADDITIONAL RECOMMENDATIONS. 

## 2020-11-29 DIAGNOSIS — J4520 Mild intermittent asthma, uncomplicated: Secondary | ICD-10-CM | POA: Insufficient documentation

## 2020-11-29 DIAGNOSIS — F17200 Nicotine dependence, unspecified, uncomplicated: Secondary | ICD-10-CM | POA: Insufficient documentation

## 2021-10-22 HISTORY — PX: SKIN CANCER EXCISION: SHX779

## 2022-06-21 ENCOUNTER — Other Ambulatory Visit: Payer: 59

## 2022-06-21 DIAGNOSIS — O209 Hemorrhage in early pregnancy, unspecified: Secondary | ICD-10-CM

## 2022-06-22 ENCOUNTER — Telehealth: Payer: Self-pay | Admitting: Adult Health

## 2022-06-22 LAB — BETA HCG QUANT (REF LAB): hCG Quant: 1653 m[IU]/mL

## 2022-06-22 NOTE — Telephone Encounter (Signed)
NO VM.

## 2022-06-26 ENCOUNTER — Telehealth: Payer: Self-pay | Admitting: *Deleted

## 2022-06-26 ENCOUNTER — Other Ambulatory Visit: Payer: Self-pay | Admitting: Adult Health

## 2022-06-26 DIAGNOSIS — O209 Hemorrhage in early pregnancy, unspecified: Secondary | ICD-10-CM

## 2022-06-26 NOTE — Progress Notes (Signed)
Ck QHCG and ABO RH

## 2022-06-26 NOTE — Telephone Encounter (Signed)
Pt aware order is in for quant and ABO RH. She will go tomorrow morning. [place]

## 2022-06-28 ENCOUNTER — Other Ambulatory Visit: Payer: Self-pay | Admitting: Adult Health

## 2022-06-28 DIAGNOSIS — O039 Complete or unspecified spontaneous abortion without complication: Secondary | ICD-10-CM

## 2022-06-28 DIAGNOSIS — O209 Hemorrhage in early pregnancy, unspecified: Secondary | ICD-10-CM

## 2022-06-28 LAB — BETA HCG QUANT (REF LAB): hCG Quant: 698 m[IU]/mL

## 2022-06-28 LAB — ABO/RH: Rh Factor: POSITIVE

## 2022-07-03 ENCOUNTER — Other Ambulatory Visit: Payer: Self-pay | Admitting: Adult Health

## 2022-07-03 DIAGNOSIS — O039 Complete or unspecified spontaneous abortion without complication: Secondary | ICD-10-CM

## 2022-07-03 LAB — BETA HCG QUANT (REF LAB): hCG Quant: 271 m[IU]/mL

## 2022-07-03 NOTE — Progress Notes (Signed)
Recheck QHCG in 1 week, order is in

## 2022-07-04 ENCOUNTER — Telehealth: Payer: Self-pay | Admitting: Adult Health

## 2022-07-04 NOTE — Telephone Encounter (Signed)
Patient wants to know when she can get her depo started again. Please advise.

## 2022-07-04 NOTE — Telephone Encounter (Signed)
Pt wants to start back on Depo. Pt's recent quant was 271. Pt was advised to repeat quant until # is less than 5. Once # gets less than 5, pt can start Depo. Pt to have quant repeated in 1 week. Pt voiced understanding. [place]

## 2022-07-11 ENCOUNTER — Other Ambulatory Visit: Payer: Self-pay | Admitting: Adult Health

## 2022-07-11 DIAGNOSIS — O039 Complete or unspecified spontaneous abortion without complication: Secondary | ICD-10-CM

## 2022-07-11 LAB — BETA HCG QUANT (REF LAB): hCG Quant: 119 m[IU]/mL

## 2022-07-16 ENCOUNTER — Encounter: Payer: Self-pay | Admitting: Obstetrics & Gynecology

## 2022-07-17 ENCOUNTER — Other Ambulatory Visit: Payer: 59

## 2022-07-17 ENCOUNTER — Other Ambulatory Visit: Payer: Self-pay | Admitting: Adult Health

## 2022-07-17 DIAGNOSIS — O039 Complete or unspecified spontaneous abortion without complication: Secondary | ICD-10-CM

## 2022-07-17 LAB — BETA HCG QUANT (REF LAB): hCG Quant: 74 m[IU]/mL

## 2022-07-26 LAB — BETA HCG QUANT (REF LAB): hCG Quant: 3 m[IU]/mL

## 2022-08-08 ENCOUNTER — Encounter: Payer: Self-pay | Admitting: Emergency Medicine

## 2022-08-08 ENCOUNTER — Ambulatory Visit
Admission: EM | Admit: 2022-08-08 | Discharge: 2022-08-08 | Disposition: A | Discharge status: Home / Self Care | Payer: 59

## 2022-08-08 ENCOUNTER — Other Ambulatory Visit: Payer: Self-pay

## 2022-08-08 DIAGNOSIS — J0140 Acute pansinusitis, unspecified: Secondary | ICD-10-CM | POA: Diagnosis not present

## 2022-08-08 MED ORDER — AMOXICILLIN-POT CLAVULANATE 875-125 MG PO TABS: 1.0000 | ORAL_TABLET | Freq: Two times a day (BID) | ORAL | 0 refills | Status: AC

## 2022-08-08 MED ORDER — BENZONATATE 100 MG PO CAPS: 100.0000 mg | ORAL_CAPSULE | Freq: Three times a day (TID) | ORAL | 0 refills | Status: DC | PRN

## 2022-08-08 NOTE — Discharge Instructions (Addendum)
You have a sinus infection. Please take the Augmentin and take the entire course for the sinus infection.  Some things that can make you feel better are: - Increased rest - Increasing fluid with water/sugar free electrolytes - Acetaminophen and ibuprofen as needed for fever/pain - Salt water gargling, chloraseptic spray and throat lozenges - OTC guaifenesin (Mucinex) 600 mg twice daily - Saline sinus flushes or a neti pot - Humidifying the air - Tessalon perles every 8 hours as needed for dry cough.

## 2022-08-08 NOTE — ED Triage Notes (Signed)
Pt reports cough, nasal congestion, body aches x1 week. Pt reports has used otc medication with no improvement of symptoms.

## 2022-08-08 NOTE — ED Provider Notes (Signed)
[facility]    CSN: [id number] Arrival date & time: 08/08/22  0903      History   Chief Complaint Chief Complaint  Patient presents with   Cough    HPI Julia Stanley is a 36 y.o. female.   Patient presents for 10 days of productive cough, shortness of breath after coughing, chest tightness, chest and nasal congestion, runny nose, postnasal drainage, sneezing, sore throat, sinus pressure and headache, and fatigue.  She denies fever, body aches or chills, wheezing, chest pain, ear pain or pressure, abdominal pain, nausea/vomiting, diarrhea, decreased appetite, loss of taste or smell, and new rash.  No known sick contacts.  Has been taking Mucinex DM and DayQuil for symptoms without relief.  Patient denies antibiotic use in the past 90 days.  Patient reports a history of asthma for which she takes an albuterol inhaler as needed.  Has not been needing to use her albuterol inhaler more than normal.    Past Medical History:  Diagnosis Date   Abnormal Pap smear    Anxiety    Frequent headaches    Nicotine addiction 10/27/2013   Dips since age 64   Polyp of colon    Seasonal allergies    Urinary frequency 08/15/2015   UTI (lower urinary tract infection) 08/15/2015   Vaginal discharge 03/10/2014    Patient Active Problem List   Diagnosis Date Noted   Chronic pain 05/09/2016   Anxiety 05/09/2016   Urinary frequency 08/15/2015   UTI (lower urinary tract infection) 08/15/2015   Hemorrhoids, internal, with bleeding    Frequent headaches 03/01/2015   GERD (gastroesophageal reflux disease) 02/02/2015   IBS (irritable bowel syndrome) 11/30/2014   Vaginal discharge 03/10/2014   Encounter for routine gynecological examination 02/08/2014   Contraception management 02/08/2014   Nicotine addiction 10/27/2013    Past Surgical History:  Procedure Laterality Date   COLONOSCOPY N/A 04/11/2015   SLF: 1. No source for diarrhea/ abdominal pain identified. 2. One large polyp  removed. 3. Large internal hemorrhoids. ADENOMA. Next surveillance 2021   HEMORRHOID BANDING N/A 04/11/2015   Procedure: HEMORRHOID BANDING;  Surgeon: Danie Binder, MD;  [facility];  Service: Endoscopy;  Laterality: N/A;   None     polyp removed  2016    OB History     Gravida  0   Para      Term      Preterm      AB      Living         SAB      IAB      Ectopic      Multiple      Live Births               Home Medications    Prior to Admission medications   Medication Sig Start Date End Date Taking? Authorizing Provider  amoxicillin-clavulanate (AUGMENTIN) 875-125 MG tablet Take 1 tablet by mouth 2 (two) times daily for 7 days. 08/08/22 08/15/22 Yes Eulogio Bear, NP  benzonatate (TESSALON) 100 MG capsule Take 1 capsule (100 mg total) by mouth 3 (three) times daily as needed for cough. Do not take with alcohol or while driving or operating heavy machinery.  May cause drowsiness. 08/08/22  Yes Eulogio Bear, NP  traZODone (DESYREL) 100 MG tablet Take 100 mg by mouth at bedtime.   Yes [provider]  fexofenadine (ALLEGRA) 180 MG tablet Take 180 mg by mouth daily. Reported on  05/09/2016    [provider]  Levonorgestrel-Ethinyl Estradiol (AMETHIA,CAMRESE) 0.1-0.02 & 0.01 MG tablet TAKE 1 TABLET EVERY DAY 12/02/17   Derrek Monaco A, NP  rizatriptan (MAXALT) 10 MG tablet TAKE 1 TABLET AT START OF HEADACHE, MAY REPEAT IN 2 HOURS IF NEEDED AS DIRECTED 01/10/17   Estill Dooms, NP    Family History Family History  Problem Relation Age of Onset   Cancer Father    Hypertension Maternal Grandfather    Hyperlipidemia Maternal Grandfather    Diabetes Maternal Grandfather    Hypertension Paternal Grandmother    Cancer Paternal Grandfather    Colon cancer Neg Hx     Social History Social History   Tobacco Use   Smoking status: Never   Smokeless tobacco: Current    Types: Snuff   Tobacco comments:    "Been  dipping since I was 7"  Substance Use Topics   Alcohol use: No    Alcohol/week: 0.0 standard drinks of alcohol   Drug use: No     Allergies   Aspirin and Sulfa antibiotics   Review of Systems Review of Systems Per HPI  Physical Exam Triage Vital Signs ED Triage Vitals  Enc Vitals Group     BP 08/08/22 1034 90/69     Pulse Rate 08/08/22 1034 (!) 106     Resp 08/08/22 1034 20     Temp 08/08/22 1034 98.4 F (36.9 C)     Temp Source 08/08/22 1034 Oral     SpO2 08/08/22 1034 97 %     Weight --      Height --      Head Circumference --      Peak Flow --      Pain Score 08/08/22 1035 2     Pain Loc --      Pain Edu? --      Excl. in [place]? --    No data found.  Updated Vital Signs BP 90/69 (BP Location: Right Arm)   Pulse (!) 106   Temp 98.4 F (36.9 C) (Oral)   Resp 20   LMP 08/01/2022 (Approximate)   SpO2 97%   Visual Acuity Right Eye Distance:   Left Eye Distance:   Bilateral Distance:    Right Eye Near:   Left Eye Near:    Bilateral Near:     Physical Exam Vitals and nursing note reviewed.  Constitutional:      General: She is not in acute distress.    Appearance: Normal appearance. She is not ill-appearing or toxic-appearing.  HENT:     Head: Normocephalic and atraumatic.     Right Ear: Tympanic membrane, ear canal and external ear normal.     Left Ear: Tympanic membrane, ear canal and external ear normal.     Nose: Congestion and rhinorrhea present.     Right Sinus: Maxillary sinus tenderness and frontal sinus tenderness present.     Left Sinus: Maxillary sinus tenderness and frontal sinus tenderness present.     Mouth/Throat:     Mouth: Mucous membranes are moist.     Pharynx: Oropharynx is clear. Posterior oropharyngeal erythema present. No oropharyngeal exudate.  Eyes:     General: No scleral icterus.    Extraocular Movements: Extraocular movements intact.  Cardiovascular:     Rate and Rhythm: Normal rate and regular rhythm.  Pulmonary:      Effort: Pulmonary effort is normal. No respiratory distress.     Breath sounds: Normal breath sounds. No wheezing,  rhonchi or rales.  Abdominal:     General: Abdomen is flat. Bowel sounds are normal. There is no distension.     Palpations: Abdomen is soft.     Tenderness: There is no abdominal tenderness.  Musculoskeletal:     Cervical back: Normal range of motion and neck supple.  Lymphadenopathy:     Cervical: Cervical adenopathy present.  Skin:    General: Skin is warm and dry.     Coloration: Skin is not jaundiced or pale.     Findings: No erythema or rash.  Neurological:     Mental Status: She is alert and oriented to person, place, and time.  Psychiatric:        Behavior: Behavior is cooperative.      UC Treatments / Results  Labs (all labs ordered are listed, but only abnormal results are displayed) Labs Reviewed - No data to display  EKG   Radiology No results found.  Procedures Procedures (including critical care time)  Medications Ordered in UC Medications - No data to display  Initial Impression / Assessment and Plan / UC Course  I have reviewed the triage vital signs and the nursing notes.  Pertinent labs & imaging results that were available during my care of the patient were reviewed by me and considered in my medical decision making (see chart for details).   Patient is well-appearing, normotensive, afebrile, not tachypneic, oxygenating well on room air.  She is mildly tachycardic today which is likely due to acute illness.  Acute non-recurrent pansinusitis Treat with Augmentin twice daily for 7 days Supportive care discussed Continue expectorant, also start saline rinses and discussed this at length with patient Start [employer] as needed for dry cough ER and return precautions discussed  The patient was given the opportunity to ask questions.  All questions answered to their satisfaction.  The patient is in agreement to this plan.    Final  Clinical Impressions(s) / UC Diagnoses   Final diagnoses:  Acute non-recurrent pansinusitis     Discharge Instructions      You have a sinus infection. Please take the Augmentin and take the entire course for the sinus infection.  Some things that can make you feel better are: - Increased rest - Increasing fluid with water/sugar free electrolytes - Acetaminophen and ibuprofen as needed for fever/pain - Salt water gargling, chloraseptic spray and throat lozenges - OTC guaifenesin (Mucinex) 600 mg twice daily - Saline sinus flushes or a neti pot - Humidifying the air - Tessalon perles every 8 hours as needed for dry cough.     ED Prescriptions     Medication Sig Dispense Auth. Provider   amoxicillin-clavulanate (AUGMENTIN) 875-125 MG tablet Take 1 tablet by mouth 2 (two) times daily for 7 days. 14 tablet Noemi Chapel A, NP   benzonatate (TESSALON) 100 MG capsule Take 1 capsule (100 mg total) by mouth 3 (three) times daily as needed for cough. Do not take with alcohol or while driving or operating heavy machinery.  May cause drowsiness. 21 capsule Eulogio Bear, NP      PDMP not reviewed this encounter.   Eulogio Bear, NP 08/08/22 1108

## 2022-08-14 ENCOUNTER — Encounter: Payer: Self-pay | Admitting: Obstetrics & Gynecology

## 2022-08-16 ENCOUNTER — Ambulatory Visit (INDEPENDENT_AMBULATORY_CARE_PROVIDER_SITE_OTHER): Payer: 59 | Admitting: Obstetrics & Gynecology

## 2022-08-16 ENCOUNTER — Encounter: Payer: Self-pay | Admitting: Obstetrics & Gynecology

## 2022-08-16 VITALS — BP 98/58 | HR 95 | Ht 64.0 in | Wt 138.0 lb

## 2022-08-16 DIAGNOSIS — Z01419 Encounter for gynecological examination (general) (routine) without abnormal findings: Secondary | ICD-10-CM

## 2022-08-16 DIAGNOSIS — Z3042 Encounter for surveillance of injectable contraceptive: Secondary | ICD-10-CM | POA: Diagnosis not present

## 2022-08-16 DIAGNOSIS — Z3202 Encounter for pregnancy test, result negative: Secondary | ICD-10-CM

## 2022-08-16 LAB — POCT URINE PREGNANCY: Preg Test, Ur: NEGATIVE

## 2022-08-16 MED ORDER — MEDROXYPROGESTERONE ACETATE 150 MG/ML IM SUSP
150.0000 mg | Freq: Once | INTRAMUSCULAR | Status: AC
  Administered 2022-08-16: 150 mg via INTRAMUSCULAR

## 2022-08-16 NOTE — Progress Notes (Signed)
Subjective:     Julia Stanley is a 36 y.o. female here for a routine exam.  No LMP recorded (lmp unknown). G0P0 Birth Control Method:  none-->restart depo today Menstrual Calendar(currently): regular  Current complaints: none.   Current acute medical issues:  none   Recent Gynecologic History No LMP recorded (lmp unknown). Last Pap: 2021,  normal Last mammogram: ,    Past Medical History:  Diagnosis Date   Abnormal Pap smear    Anxiety    Frequent headaches    Nicotine addiction 10/27/2013   Dips since age 31   Polyp of colon    Seasonal allergies    Urinary frequency 08/15/2015   UTI (lower urinary tract infection) 08/15/2015   Vaginal discharge 03/10/2014    Past Surgical History:  Procedure Laterality Date   COLONOSCOPY N/A 04/11/2015   SLF: 1. No source for diarrhea/ abdominal pain identified. 2. One large polyp removed. 3. Large internal hemorrhoids. ADENOMA. Next surveillance 2021   HEMORRHOID BANDING N/A 04/11/2015   Procedure: HEMORRHOID BANDING;  Surgeon: Danie Binder, MD;  Location: [facility];  Service: Endoscopy;  Laterality: N/A;   None     polyp removed  2016    OB History     Gravida  0   Para      Term      Preterm      AB      Living         SAB      IAB      Ectopic      Multiple      Live Births              Social History   Socioeconomic History   Marital status: Single    Spouse name: Not on file   Number of children: Not on file   Years of education: Not on file   Highest education level: Not on file  Occupational History   Occupation: disability  Tobacco Use   Smoking status: Never   Smokeless tobacco: Current    Types: Snuff   Tobacco comments:    "Been dipping since I was 7"  Substance and Sexual Activity   Alcohol use: No    Alcohol/week: 0.0 standard drinks of alcohol   Drug use: No   Sexual activity: Yes    Birth control/protection: Pill  Other Topics Concern   Not on file  Social History  Narrative   Not on file   Social Determinants of Health   Financial Resource Strain: Low Risk  (08/16/2022)   Overall Financial Resource Strain (CARDIA)    Difficulty of Paying Living Expenses: Not hard at all  Food Insecurity: No Food Insecurity (08/16/2022)   Hunger Vital Sign    Worried About Running Out of Food in the Last Year: Never true    [place] in the Last Year: Never true  Transportation Needs: No Transportation Needs (08/16/2022)   PRAPARE - Hydrologist (Medical): No    Lack of Transportation (Non-Medical): No  Physical Activity: Inactive (08/16/2022)   Exercise Vital Sign    Days of Exercise per Week: 0 days    Minutes of Exercise per Session: 0 min  Stress: No Stress Concern Present (08/16/2022)   [place]    Feeling of Stress : Only a little  Social Connections: Moderately Isolated (08/16/2022)   Social Connection and Isolation Panel [  NHANES]    Frequency of Communication with Friends and Family: More than three times a week    Frequency of Social Gatherings with Friends and Family: Three times a week    Attends Religious Services: More than 4 times per year    Active Member of Clubs or Organizations: No    Attends Archivist Meetings: Never    Marital Status: Never married    Family History  Problem Relation Age of Onset   Cancer Father    Hypertension Maternal Grandfather    Hyperlipidemia Maternal Grandfather    Diabetes Maternal Grandfather    Hypertension Paternal Grandmother    Cancer Paternal Grandfather    Colon cancer Neg Hx      Current Outpatient Medications:    fexofenadine (ALLEGRA) 180 MG tablet, Take 180 mg by mouth daily. Reported on 05/09/2016, Disp: , Rfl:    traZODone (DESYREL) 100 MG tablet, Take 100 mg by mouth at bedtime., Disp: , Rfl:   Review of Systems  Review of Systems  Constitutional: Negative for fever,  chills, weight loss, malaise/fatigue and diaphoresis.  HENT: Negative for hearing loss, ear pain, nosebleeds, congestion, sore throat, neck pain, tinnitus and ear discharge.   Eyes: Negative for blurred vision, double vision, photophobia, pain, discharge and redness.  Respiratory: Negative for cough, hemoptysis, sputum production, shortness of breath, wheezing and stridor.   Cardiovascular: Negative for chest pain, palpitations, orthopnea, claudication, leg swelling and PND.  Gastrointestinal: negative for abdominal pain. Negative for heartburn, nausea, vomiting, diarrhea, constipation, blood in stool and melena.  Genitourinary: Negative for dysuria, urgency, frequency, hematuria and flank pain.  Musculoskeletal: Negative for myalgias, back pain, joint pain and falls.  Skin: Negative for itching and rash.  Neurological: Negative for dizziness, tingling, tremors, sensory change, speech change, focal weakness, seizures, loss of consciousness, weakness and headaches.  Endo/Heme/Allergies: Negative for environmental allergies and polydipsia. Does not bruise/bleed easily.  Psychiatric/Behavioral: Negative for depression, suicidal ideas, hallucinations, memory loss and substance abuse. The patient is not nervous/anxious and does not have insomnia.        Objective:  Blood pressure (!) 98/58, pulse 95, height '5\' 4"'$  (1.626 m), weight 138 lb (62.6 kg).   Physical Exam  Vitals reviewed. Constitutional: She is oriented to person, place, and time. She appears well-developed and well-nourished.  HENT:  Head: Normocephalic and atraumatic.        Right Ear: External ear normal.  Left Ear: External ear normal.  Nose: Nose normal.  Mouth/Throat: Oropharynx is clear and moist.  Eyes: Conjunctivae and EOM are normal. Pupils are equal, round, and reactive to light. Right eye exhibits no discharge. Left eye exhibits no discharge. No scleral icterus.  Neck: Normal range of motion. Neck supple. No tracheal  deviation present. No thyromegaly present.  Cardiovascular: Normal rate, regular rhythm, normal heart sounds and intact distal pulses.  Exam reveals no gallop and no friction rub.   No murmur heard. Respiratory: Effort normal and breath sounds normal. No respiratory distress. She has no wheezes. She has no rales. She exhibits no tenderness.  GI: Soft. Bowel sounds are normal. She exhibits no distension and no mass. There is no tenderness. There is no rebound and no guarding.  Genitourinary:  Breasts no masses skin changes or nipple changes bilaterally Yeast skin changes under the breast      Vulva is normal without lesions Vagina is pink moist without discharge Cervix normal in appearance and pap is not done Uterus is normal size shape and  contour Adnexa is negative with normal sized ovaries   Musculoskeletal: Normal range of motion. She exhibits no edema and no tenderness.  Neurological: She is alert and oriented to person, place, and time. She has normal reflexes. She displays normal reflexes. No cranial nerve deficit. She exhibits normal muscle tone. Coordination normal.  Skin: Skin is warm and dry. No rash noted. No erythema. No pallor.  Psychiatric: She has a normal mood and affect. Her behavior is normal. Judgment and thought content normal.       Medications Ordered at today's visit: No orders of the defined types were placed in this encounter.   Other orders placed at today's visit: Orders Placed This Encounter  Procedures   POCT urine pregnancy      Assessment:    Normal Gyn exam.    Plan:    Contraception: Depo-Provera injections. Follow up in: 1 years.     No follow-ups on file.

## 2022-08-31 ENCOUNTER — Encounter: Payer: Self-pay | Admitting: Obstetrics & Gynecology

## 2022-09-03 ENCOUNTER — Other Ambulatory Visit: Payer: Self-pay | Admitting: Adult Health

## 2022-09-03 MED ORDER — FLUCONAZOLE 150 MG PO TABS: ORAL_TABLET | ORAL | 1 refills | Status: DC

## 2022-09-03 NOTE — Progress Notes (Signed)
Rx diflucan.  

## 2022-09-10 ENCOUNTER — Other Ambulatory Visit: Payer: Self-pay | Admitting: Adult Health

## 2022-09-10 MED ORDER — MEGESTROL ACETATE 40 MG PO TABS: ORAL_TABLET | ORAL | 0 refills | Status: DC

## 2022-09-10 NOTE — Progress Notes (Signed)
Will rx megace 

## 2022-10-02 ENCOUNTER — Telehealth: Payer: Self-pay | Admitting: Obstetrics & Gynecology

## 2022-10-02 NOTE — Telephone Encounter (Signed)
Patient called and wants to know if Dr. Elonda Husky seen any warts when she came in for her annual back in October. I see that a pap was not done due to not being due yet. Patient is concerned that she may have warts. Asking for a call back to see how she can be tested.

## 2022-10-03 ENCOUNTER — Encounter: Payer: Self-pay | Admitting: Adult Health

## 2022-10-03 ENCOUNTER — Other Ambulatory Visit (HOSPITAL_COMMUNITY)
Admission: RE | Admit: 2022-10-03 | Discharge: 2022-10-03 | Disposition: A | Discharge status: Home / Self Care | Payer: 59 | Source: Ambulatory Visit | Attending: Adult Health | Admitting: Adult Health

## 2022-10-03 ENCOUNTER — Ambulatory Visit (INDEPENDENT_AMBULATORY_CARE_PROVIDER_SITE_OTHER): Payer: 59 | Admitting: Adult Health

## 2022-10-03 VITALS — BP 89/58 | HR 81 | Ht 65.0 in | Wt 142.0 lb

## 2022-10-03 DIAGNOSIS — Z124 Encounter for screening for malignant neoplasm of cervix: Secondary | ICD-10-CM | POA: Insufficient documentation

## 2022-10-03 DIAGNOSIS — Z113 Encounter for screening for infections with a predominantly sexual mode of transmission: Secondary | ICD-10-CM | POA: Diagnosis present

## 2022-10-03 DIAGNOSIS — Z1151 Encounter for screening for human papillomavirus (HPV): Secondary | ICD-10-CM | POA: Diagnosis not present

## 2022-10-03 DIAGNOSIS — Z01419 Encounter for gynecological examination (general) (routine) without abnormal findings: Secondary | ICD-10-CM | POA: Insufficient documentation

## 2022-10-03 NOTE — Progress Notes (Signed)
  Subjective:     Patient ID: Julia Stanley, female   DOB: 02-27-1986, 36 y.o.   MRN: [id number]  HPI Julia Stanley is a 36 year old white female,single, G0P0, in requesting STD testing and pap with HPV. Her Ex said he had blood drawn and has HPV. She had physical with Dr Elonda Husky 10//26/23.   Review of Systems Denies any discharge, warts or itching   Reviewed past medical,surgical, social and family history. Reviewed medications and allergies.  Objective:   Physical Exam BP (!) 89/58 (BP Location: Left Arm, Patient Position: Sitting, Cuff Size: Normal)   Pulse 81   Ht '5\' 5"'$  (1.651 m)   Wt 142 lb (64.4 kg)   BMI 23.63 kg/m     Skin warm and dry.Pelvic: external genitalia is normal in appearance no lesions, vagina: pink,urethra has no lesions or masses noted, cervix:smooth, pap with HR HPV genotyping performed, uterus: normal size, shape and contour, non tender, no masses felt, adnexa: no masses or tenderness noted. Bladder is non tender and no masses felt. CV swab obtained   Upstream - 10/03/22 1107       Pregnancy Intention Screening   Does the patient want to become pregnant in the next year? No    Does the patient's partner want to become pregnant in the next year? No    Would the patient like to discuss contraceptive options today? No      Contraception Wrap Up   Current Method Hormonal Injection    End Method Hormonal Injection    Contraception Counseling Provided No            Examination chaperoned by Dewitt Hoes RN  Assessment:     1. Routine Papanicolaou smear Pap sent with HR HPV genotyping  Pap in 3 years if normal  - Cytology - PAP  2. Screening examination for STD (sexually transmitted disease) CV swab sent for GC/CHL,trich, BV and yeast  Will check labs  - Cervicovaginal ancillary only( [place]) - Hepatitis C antibody - Hepatitis B surface antigen - HIV Antibody (routine testing w rflx) - RPR     Plan:      Pap in 3 years if normal

## 2022-10-04 LAB — CERVICOVAGINAL ANCILLARY ONLY
Bacterial Vaginitis (gardnerella): POSITIVE — AB
Candida Glabrata: NEGATIVE
Candida Vaginitis: NEGATIVE
Chlamydia: NEGATIVE
Comment: NEGATIVE
Comment: NEGATIVE
Comment: NEGATIVE
Comment: NEGATIVE
Comment: NEGATIVE
Comment: NORMAL
Neisseria Gonorrhea: NEGATIVE
Trichomonas: NEGATIVE

## 2022-10-04 LAB — HEPATITIS C ANTIBODY: Hep C Virus Ab: NONREACTIVE

## 2022-10-04 LAB — HIV ANTIBODY (ROUTINE TESTING W REFLEX): HIV Screen 4th Generation wRfx: NONREACTIVE

## 2022-10-04 LAB — RPR: RPR Ser Ql: NONREACTIVE

## 2022-10-04 LAB — HEPATITIS B SURFACE ANTIGEN: Hepatitis B Surface Ag: NEGATIVE

## 2022-10-05 ENCOUNTER — Other Ambulatory Visit: Payer: Self-pay | Admitting: Adult Health

## 2022-10-05 MED ORDER — METRONIDAZOLE 500 MG PO TABS: 500.0000 mg | ORAL_TABLET | Freq: Two times a day (BID) | ORAL | 0 refills | Status: DC

## 2022-10-08 LAB — CYTOLOGY - PAP
Comment: NEGATIVE
Diagnosis: NEGATIVE
High risk HPV: NEGATIVE

## 2022-10-10 ENCOUNTER — Other Ambulatory Visit: Payer: 59

## 2022-11-05 ENCOUNTER — Telehealth: Payer: Self-pay | Admitting: Adult Health

## 2022-11-05 ENCOUNTER — Ambulatory Visit (INDEPENDENT_AMBULATORY_CARE_PROVIDER_SITE_OTHER): Payer: 59 | Admitting: *Deleted

## 2022-11-05 ENCOUNTER — Ambulatory Visit: Payer: 59

## 2022-11-05 DIAGNOSIS — Z3042 Encounter for surveillance of injectable contraceptive: Secondary | ICD-10-CM

## 2022-11-05 MED ORDER — MEDROXYPROGESTERONE ACETATE 150 MG/ML IM SUSP
150.0000 mg | Freq: Once | INTRAMUSCULAR | Status: AC
  Administered 2022-11-05: 150 mg via INTRAMUSCULAR

## 2022-11-05 MED ORDER — MEDROXYPROGESTERONE ACETATE 150 MG/ML IM SUSP: 150.0000 mg | INTRAMUSCULAR | 4 refills | Status: DC

## 2022-11-05 NOTE — Telephone Encounter (Signed)
Pt aware that rx for depo sent to [facility]

## 2022-11-05 NOTE — Telephone Encounter (Signed)
Pt is requesting Depo to be sent to her pharmacy

## 2022-11-05 NOTE — Progress Notes (Signed)
   NURSE VISIT- INJECTION  SUBJECTIVE:  Julia Stanley is a 37 y.o. G0P0 female here for a Depo Provera for contraception/period management. She is a GYN patient.   OBJECTIVE:  There were no vitals taken for this visit.  Appears well, in no apparent distress  Injection administered in: Right deltoid  Meds ordered this encounter  Medications   medroxyPROGESTERone (DEPO-PROVERA) injection 150 mg    ASSESSMENT: GYN patient Depo Provera for contraception/period management PLAN: Follow-up: in 11-13 weeks for next Depo   Julia Stanley  11/05/2022 11:11 AM

## 2022-11-05 NOTE — Telephone Encounter (Signed)
Patient calling stating that her pharmacy still has not received her script for her depo at [facility].

## 2022-11-06 ENCOUNTER — Encounter: Payer: Self-pay | Admitting: *Deleted

## 2023-01-01 ENCOUNTER — Ambulatory Visit: Payer: 59 | Admitting: Orthopaedic Surgery

## 2023-01-08 ENCOUNTER — Other Ambulatory Visit: Payer: 59

## 2023-01-08 ENCOUNTER — Other Ambulatory Visit (INDEPENDENT_AMBULATORY_CARE_PROVIDER_SITE_OTHER): Payer: 59

## 2023-01-08 ENCOUNTER — Ambulatory Visit (INDEPENDENT_AMBULATORY_CARE_PROVIDER_SITE_OTHER): Payer: 59 | Admitting: Orthopaedic Surgery

## 2023-01-08 ENCOUNTER — Encounter: Payer: Self-pay | Admitting: Orthopaedic Surgery

## 2023-01-08 VITALS — BP 107/75 | HR 101 | Ht 66.0 in | Wt 157.4 lb

## 2023-01-08 DIAGNOSIS — G8929 Other chronic pain: Secondary | ICD-10-CM

## 2023-01-08 DIAGNOSIS — M545 Low back pain, unspecified: Secondary | ICD-10-CM

## 2023-01-08 DIAGNOSIS — M25562 Pain in left knee: Secondary | ICD-10-CM | POA: Diagnosis not present

## 2023-01-08 DIAGNOSIS — J329 Chronic sinusitis, unspecified: Secondary | ICD-10-CM | POA: Insufficient documentation

## 2023-01-08 NOTE — Patient Instructions (Signed)
Do your back exercises 2-3 times daily until your next visit.  Physical therapy has been ordered for you at [facility]. They should call you to schedule, [phone] is the phone number to call, if you want to call to schedule.   DR.KEELING'S SCHEDULE IS AS FOLLOWS: TUESDAY: ALL DAY WEDNESDAY: MORNING ONLY THURSDAY: MORNING ONLY PLEASE CALL OR SEND A MESSAGE VIA MYCHART BY WEDNESDAY MORNING SO THAT I CAN SEND A REQUEST TO HIM. HE LEAVES THURSDAY BY 11:30AM. HE DOES NOT RETURN TO THE OFFICE UNTIL TUESDAY MORNINGS AND HE DOES NOT CHECK HIS WORK MESSAGES DURING HIS TIME AWAY FROM THE OFFICE. I'M ABBY AND IF YOU NEED SOMETHING, SEND ME A MYCHART MESSAGE OR CALL. MYCHART IS THE FASTEST WAY TO GET IN CONTACT WITH ME.

## 2023-01-08 NOTE — Progress Notes (Signed)
Subjective:    Patient ID: Julia Stanley, female    DOB: 11/12/1985, 37 y.o.   MRN: [id number]  HPI She has lower back pain that radiates down both legs but more on the left side. She has no trauma, no weakness.  This has been going on several months.  She had episode of lower back pain in 2017 and 2018 but that resolved until now.  She has no trauma, no weakness. She cannot take NSAIDs.  She has tried Tylenol, heat and ice with not much relief.  She has left knee pain with swelling and popping but no giving way.  No trauma, no redness.   Review of Systems  Constitutional:  Positive for activity change.  Musculoskeletal:  Positive for arthralgias, back pain, gait problem, joint swelling and myalgias.  All other systems reviewed and are negative. For Review of Systems, all other systems reviewed and are negative.  The following is a summary of the past history medically, past history surgically, known current medicines, social history and family history.  This information is gathered electronically by the computer from prior information and documentation.  I review this each visit and have found including this information at this point in the chart is beneficial and informative.   Past Medical History:  Diagnosis Date   Abnormal Pap smear    Anxiety    Frequent headaches    Nicotine addiction 10/27/2013   Dips since age 33   Polyp of colon    Seasonal allergies    Urinary frequency 08/15/2015   UTI (lower urinary tract infection) 08/15/2015   Vaginal discharge 03/10/2014    Past Surgical History:  Procedure Laterality Date   COLONOSCOPY N/A 04/11/2015   SLF: 1. No source for diarrhea/ abdominal pain identified. 2. One large polyp removed. 3. Large internal hemorrhoids. ADENOMA. Next surveillance 2021   HEMORRHOID BANDING N/A 04/11/2015   Procedure: HEMORRHOID BANDING;  Surgeon: Danie Binder, MD;  Location: [facility];  Service: Endoscopy;  Laterality: N/A;   None     polyp  removed  2016    Current Outpatient Medications on File Prior to Visit  Medication Sig Dispense Refill   fexofenadine (ALLEGRA) 180 MG tablet Take 180 mg by mouth daily. Reported on 05/09/2016     medroxyPROGESTERone (DEPO-PROVERA) 150 MG/ML injection Inject 1 mL (150 mg total) into the muscle every 3 (three) months. 1 mL 4   traZODone (DESYREL) 100 MG tablet Take 100 mg by mouth at bedtime.     No current facility-administered medications on file prior to visit.    Social History   Socioeconomic History   Marital status: Single    Spouse name: Not on file   Number of children: Not on file   Years of education: Not on file   Highest education level: Not on file  Occupational History   Occupation: disability  Tobacco Use   Smoking status: Never   Smokeless tobacco: Current    Types: Snuff   Tobacco comments:    "Been dipping since I was 7"  Vaping Use   Vaping Use: Some days  Substance and Sexual Activity   Alcohol use: No    Alcohol/week: 0.0 standard drinks of alcohol   Drug use: No   Sexual activity: Not Currently    Birth control/protection: Injection  Other Topics Concern   Not on file  Social History Narrative   Not on file   Social Determinants of Health   Financial Resource Strain: Low  Risk  (08/16/2022)   Overall Financial Resource Strain (CARDIA)    Difficulty of Paying Living Expenses: Not hard at all  Food Insecurity: No Food Insecurity (08/16/2022)   Hunger Vital Sign    Worried About Running Out of Food in the Last Year: Never true    Ran Out of Food in the Last Year: Never true  Transportation Needs: No Transportation Needs (08/16/2022)   PRAPARE - Hydrologist (Medical): No    Lack of Transportation (Non-Medical): No  Physical Activity: Inactive (08/16/2022)   Exercise Vital Sign    Days of Exercise per Week: 0 days    Minutes of Exercise per Session: 0 min  Stress: No Stress Concern Present (08/16/2022)   [place]    Feeling of Stress : Only a little  Social Connections: Moderately Isolated (08/16/2022)   Social Connection and Isolation Panel [NHANES]    Frequency of Communication with Friends and Family: More than three times a week    Frequency of Social Gatherings with Friends and Family: Three times a week    Attends Religious Services: More than 4 times per year    Active Member of Clubs or Organizations: No    Attends Archivist Meetings: Never    Marital Status: Never married  Intimate Partner Violence: Not At Risk (08/16/2022)   Humiliation, Afraid, Rape, and Kick questionnaire    Fear of Current or Ex-Partner: No    Emotionally Abused: No    Physically Abused: No    Sexually Abused: No    Family History  Problem Relation Age of Onset   Cancer Father    Hypertension Maternal Grandfather    Hyperlipidemia Maternal Grandfather    Diabetes Maternal Grandfather    Hypertension Paternal Grandmother    Cancer Paternal Grandfather    Colon cancer Neg Hx     BP 107/75   Pulse (!) 101   Ht 5\' 6"  (1.676 m)   Wt 157 lb 6.4 oz (71.4 kg)   BMI 25.41 kg/m   Body mass index is 25.41 kg/m.      Objective:   Physical Exam Vitals and nursing note reviewed. Exam conducted with a chaperone present.  Constitutional:      Appearance: She is well-developed.  HENT:     Head: Normocephalic and atraumatic.  Eyes:     Conjunctiva/sclera: Conjunctivae normal.     Pupils: Pupils are equal, round, and reactive to light.  Cardiovascular:     Rate and Rhythm: Normal rate and regular rhythm.  Pulmonary:     Effort: Pulmonary effort is normal.  Abdominal:     Palpations: Abdomen is soft.  Musculoskeletal:       Arms:     Cervical back: Normal range of motion and neck supple.       Legs:  Skin:    General: Skin is warm and dry.  Neurological:     Mental Status: She is alert and oriented to person, place, and  time.     Cranial Nerves: No cranial nerve deficit.     Motor: No abnormal muscle tone.     Coordination: Coordination normal.     Deep Tendon Reflexes: Reflexes are normal and symmetric. Reflexes normal.  Psychiatric:        Behavior: Behavior normal.        Thought Content: Thought content normal.        Judgment: Judgment normal.  X-rays were done of the lower back and left knee, reported separately.        Assessment & Plan:   Encounter Diagnoses  Name Primary?   Lumbar pain Yes   Chronic pain of left knee    I will begin PT for the lower back.  She may need MRI.  Exercise sheet for the back given.  Return in one month.  Call if any problem.  Precautions discussed.  Electronically Signed Sanjuana Kava, MD 3/19/20242:35 PM

## 2023-01-22 ENCOUNTER — Ambulatory Visit (INDEPENDENT_AMBULATORY_CARE_PROVIDER_SITE_OTHER): Payer: 59 | Admitting: *Deleted

## 2023-01-22 DIAGNOSIS — Z3042 Encounter for surveillance of injectable contraceptive: Secondary | ICD-10-CM | POA: Diagnosis not present

## 2023-01-22 MED ORDER — MEDROXYPROGESTERONE ACETATE 150 MG/ML IM SUSP
150.0000 mg | Freq: Once | INTRAMUSCULAR | Status: AC
  Administered 2023-01-22: 150 mg via INTRAMUSCULAR

## 2023-01-22 NOTE — Progress Notes (Signed)
   NURSE VISIT- INJECTION  SUBJECTIVE:  Julia Stanley is a 37 y.o. G0P0 female here for a Depo Provera for contraception/period management. She is a GYN patient.   OBJECTIVE:  LMP  (LMP Unknown)   Appears well, in no apparent distress  Injection administered in: Left deltoid  Meds ordered this encounter  Medications   medroxyPROGESTERone (DEPO-PROVERA) injection 150 mg    ASSESSMENT: GYN patient Depo Provera for contraception/period management PLAN: Follow-up: in 11-13 weeks for next Depo   [name]  01/22/2023 3:44 PM

## 2023-01-23 ENCOUNTER — Ambulatory Visit: Payer: 59

## 2023-02-05 ENCOUNTER — Ambulatory Visit: Payer: 59 | Admitting: Orthopaedic Surgery

## 2023-04-10 ENCOUNTER — Ambulatory Visit (INDEPENDENT_AMBULATORY_CARE_PROVIDER_SITE_OTHER): Payer: 59 | Admitting: *Deleted

## 2023-04-10 DIAGNOSIS — Z3042 Encounter for surveillance of injectable contraceptive: Secondary | ICD-10-CM | POA: Diagnosis not present

## 2023-04-10 MED ORDER — MEDROXYPROGESTERONE ACETATE 150 MG/ML IM SUSP
150.0000 mg | Freq: Once | INTRAMUSCULAR | Status: AC
  Administered 2023-04-10: 150 mg via INTRAMUSCULAR

## 2023-04-10 NOTE — Progress Notes (Signed)
   NURSE VISIT- INJECTION  SUBJECTIVE:  Julia Stanley is a 37 y.o. G0P0 female here for a Depo Provera for contraception/period management. She is a GYN patient.   OBJECTIVE:  There were no vitals taken for this visit.  Appears well, in no apparent distress  Injection administered in: Right deltoid  Meds ordered this encounter  Medications   medroxyPROGESTERone (DEPO-PROVERA) injection 150 mg    ASSESSMENT: GYN patient Depo Provera for contraception/period management PLAN: Follow-up: in 11-13 weeks for next Depo   Jobe Marker  04/10/2023 10:36 AM

## 2023-05-08 ENCOUNTER — Other Ambulatory Visit: Payer: Self-pay | Admitting: Adult Health

## 2023-05-08 MED ORDER — MEGESTROL ACETATE 40 MG PO TABS: ORAL_TABLET | ORAL | 1 refills | Status: DC

## 2023-05-08 NOTE — Progress Notes (Signed)
Rx megace??  

## 2023-05-27 ENCOUNTER — Encounter: Payer: Self-pay | Admitting: Internal Medicine

## 2023-05-27 ENCOUNTER — Other Ambulatory Visit: Payer: Self-pay

## 2023-05-27 ENCOUNTER — Ambulatory Visit (INDEPENDENT_AMBULATORY_CARE_PROVIDER_SITE_OTHER): Payer: 59 | Admitting: Internal Medicine

## 2023-05-27 VITALS — BP 98/60 | HR 89 | Temp 98.4°F | Resp 16 | Ht 63.78 in | Wt 173.8 lb

## 2023-05-27 DIAGNOSIS — J329 Chronic sinusitis, unspecified: Secondary | ICD-10-CM

## 2023-05-27 DIAGNOSIS — J3089 Other allergic rhinitis: Secondary | ICD-10-CM | POA: Diagnosis not present

## 2023-05-27 DIAGNOSIS — J4520 Mild intermittent asthma, uncomplicated: Secondary | ICD-10-CM | POA: Diagnosis not present

## 2023-05-27 DIAGNOSIS — K219 Gastro-esophageal reflux disease without esophagitis: Secondary | ICD-10-CM | POA: Diagnosis not present

## 2023-05-27 MED ORDER — FAMOTIDINE 20 MG PO TABS: 20.0000 mg | ORAL_TABLET | Freq: Two times a day (BID) | ORAL | 5 refills | Status: AC | PRN

## 2023-05-27 MED ORDER — ALBUTEROL SULFATE HFA 108 (90 BASE) MCG/ACT IN AERS: 1.0000 | INHALATION_SPRAY | Freq: Four times a day (QID) | RESPIRATORY_TRACT | 1 refills | Status: AC | PRN

## 2023-05-27 MED ORDER — BUDESONIDE 32 MCG/ACT NA SUSP: NASAL | 5 refills | Status: DC

## 2023-05-27 NOTE — Patient Instructions (Addendum)
Other Allergic Rhinitis: - Use nasal saline rinses before nose sprays such as with Neilmed Sinus Rinse.  Use distilled water.   - Use Rhinocort 1-2 sprays each nostril daily. Aim upward and outward. - Hold all anti histamines (Allegra, Benadryl, Zyrtec, Claritin, Xyzal) 3 days prior to next visit for skin testing.  Recurrent Sinus Infections - Will obtain labwork to test your immune system due to history of recurrent sinus infections requiring antibiotics.    Mild Intermittent Asthma: - Maintenance inhaler: none - Rescue inhaler: Albuterol 2 puffs via spacer or 1 vial via nebulizer every 4-6 hours as needed for respiratory symptoms of cough, shortness of breath, or wheezing Asthma control goals:  Full participation in all desired activities (may need albuterol before activity) Albuterol use two times or less a week on average (not counting use with activity) Cough interfering with sleep two times or less a month Oral steroids no more than once a year No hospitalizations   GERD - Use Omeprazole 10mg  daily in morning on an empty stomach.  Eat about 30-45 minutes later. - Use Pepcid (Famotidine) 20mg  twice daily as needed for reflux/heartburn.  -Avoid lying down for at least two hours after a meal or after drinking acidic beverages, like soda, or other caffeinated beverages. This can help to prevent stomach contents from flowing back into the esophagus. -Keep your head elevated while you sleep. Using an extra pillow or two can also help to prevent reflux. -Eat smaller and more frequent meals each day instead of a few large meals. This promotes digestion and can aid in preventing heartburn. -Wear loose-fitting clothes to ease pressure on the stomach, which can worsen heartburn and reflux. -Reduce excess weight around the midsection. This can ease pressure on the stomach. Such pressure can force some stomach contents back up the esophagus.    Follow up: Friday 05/31/2023, overbook for 8:30 AM  with me for skin testing.

## 2023-05-27 NOTE — Progress Notes (Signed)
NEW PATIENT  Date of Service/Encounter:  05/27/23  Consult requested by: Erasmo Downer, NP   Subjective:   Julia Stanley (DOB: 01-13-1986) is a 37 y.o. female who presents to the clinic on 05/27/2023 with a chief complaint of Allergic Rhinitis  (Says she gets sinus infections often. Never tested for allergies but states she is allergic to plenty of things. Dog does stay in the house.) and Sinus Problem (Says she stays on antibiotics. Says she gets sinus infections a lot. ) .    History obtained from: chart review and patient.   Asthma:  Diagnosed at age 30.  Only has symptoms of wheezing/cough/SOB with bad respiratory illness.   Two to three days of daytime symptoms in past month, none nighttime awakenings in past month Using rescue inhaler: about twice a month or less  Limitations to daily activity: none 0 ED visits/UC visits and 1 oral steroids in the past year 0 number of lifetime hospitalizations, 0 number of lifetime intubations.  Identified Triggers: respiratory illness Prior PFTs or spirometry: not sure  Current regimen:  Maintenance: none Rescue: Albuterol 2 puffs q4-6 hrs PRN   Recurrent Infections:  Has sinus infections about twice a month requiring antibiotics. No other infections involving skin, GI, joint, brain.  No hospitalization, No IV abx .   Rhinitis:  Started in childhood.  Symptoms include: nasal congestion, rhinorrhea, post nasal drainage, sneezing, watery eyes, and itchy eyes  Occurs year-round but worse in Fall/Winter  Potential triggers: not sure   Treatments tried:  Has trouble with nosebleeds  Has tried Atrovent, Azelastine, Flonase Allegra 180mg  PRN; last use was this morning   Previous allergy testing: no History of sinus surgery: no Nonallergic triggers: none    Heartburn/Reflux: Takes Prilosec 10mg  daily which helps somewhat but when she eats acidic foods like tomatoes, she has heartburn.  Past Medical History: Past Medical  History:  Diagnosis Date   Abnormal Pap smear    Anxiety    Frequent headaches    Nicotine addiction 10/27/2013   Dips since age 59   Polyp of colon    Seasonal allergies    Urinary frequency 08/15/2015   UTI (lower urinary tract infection) 08/15/2015   Vaginal discharge 03/10/2014   Past Surgical History: Past Surgical History:  Procedure Laterality Date   COLONOSCOPY N/A 04/11/2015   SLF: 1. No source for diarrhea/ abdominal pain identified. 2. One large polyp removed. 3. Large internal hemorrhoids. ADENOMA. Next surveillance 2021   HEMORRHOID BANDING N/A 04/11/2015   Procedure: HEMORRHOID BANDING;  Surgeon: West Bali, MD;  Location: [facility];  Service: Endoscopy;  Laterality: N/A;   None     polyp removed  10/22/2014   SKIN CANCER EXCISION  2023    Family History: Family History  Problem Relation Age of Onset   Cancer Father    Asthma Maternal Grandmother    COPD Maternal Grandmother    Hypertension Maternal Grandfather    Hyperlipidemia Maternal Grandfather    Diabetes Maternal Grandfather    Hypertension Paternal Grandmother    Cancer Paternal Grandfather    Colon cancer Neg Hx     Social History:  Flooring in bedroom: Engineer, civil (consulting) Pets: dog Tobacco use/exposure: dips, one can per day for 18 years  Job: n/a  Medication List:  Allergies as of 05/27/2023       Reactions   Aspirin Other (See Comments)   Nose bleeds.   Sulfa Antibiotics Hives   Childhood allergy  Medication List        Accurate as of May 27, 2023 12:23 PM. If you have any questions, ask your nurse or doctor.          STOP taking these medications    megestrol 40 MG tablet Commonly known as: MEGACE Stopped by: Birder Robson       TAKE these medications    butalbital-acetaminophen-caffeine 50-325-40 MG tablet Commonly known as: FIORICET Take 1 tablet by mouth every 6 (six) hours as needed.   fexofenadine 180 MG tablet Commonly known as: ALLEGRA Take 180 mg by  mouth daily. Reported on 05/09/2016   medroxyPROGESTERone 150 MG/ML injection Commonly known as: DEPO-PROVERA Inject 1 mL (150 mg total) into the muscle every 3 (three) months.   ondansetron 4 MG tablet Commonly known as: ZOFRAN Take 4 mg by mouth every 8 (eight) hours as needed.   PriLOSEC 10 MG Pack Generic drug: Omeprazole Magnesium Take 10 mg by mouth 2 (two) times daily as needed.   sertraline 100 MG tablet Commonly known as: ZOLOFT Take 100 mg by mouth daily.   traZODone 100 MG tablet Commonly known as: DESYREL Take 100 mg by mouth at bedtime.         REVIEW OF SYSTEMS: Pertinent positives and negatives discussed in HPI.   Objective:   Physical Exam: BP 98/60   Pulse 89   Temp 98.4 F (36.9 C) (Temporal)   Resp 16   Ht 5' 3.78" (1.62 m)   Wt 173 lb 12.8 oz (78.8 kg)   SpO2 99%   BMI 30.04 kg/m  Body mass index is 30.04 kg/m. GEN: alert, well developed HEENT: clear conjunctiva, TM grey and translucent, nose with + inferior turbinate hypertrophy, pink nasal mucosa, slight clear rhinorrhea, + cobblestoning HEART: regular rate and rhythm, no murmur LUNGS: clear to auscultation bilaterally, no coughing, unlabored respiration ABDOMEN: soft, non distended  SKIN: no rashes or lesions  Reviewed:  03/12/2023: seen by [facility] for full body skin evaluation. Noted to have seborrheic keratosis, benign nevi, lentigines, cherry angioma, actinic skin damage. Discussed sun protection.  Obtained biopsies.   02/04/2023: seen in urgent care for cough, congestion, headaches, phlegm. Started on augmentin and prednisone for serous otitis media.    01/08/2023: seen by Ortho for lumbar pain and L knee pain. Discussed PT and possibly MRI.   09/21/2022: seen in urgent care for sinus congestion and cough for 1 week. Started on augmentin and prednisone for sinusitis.   08/08/2022: seen in ED for cough, SOB, coughing, chest tightness, nasal congestion, PND, sore throat,  headaches, fatigue, sinus pressure. Started on Augmentin, saline rinses, tessalon perles for acute non recurrent pansinusitis.    Spirometry:  Tracings reviewed. Her effort: Good reproducible efforts. FVC: 3.61L FEV1: 3.06L, 102% predicted FEV1/FVC ratio: 85% Interpretation: Spirometry consistent with normal pattern.  Please see scanned spirometry results for details.   Assessment:   1. Recurrent sinus infections   2. Other allergic rhinitis   3. Mild intermittent asthma without complication   4. Gastroesophageal reflux disease, unspecified whether esophagitis present     Plan/Recommendations:  Other Allergic Rhinitis: - Use nasal saline rinses before nose sprays such as with Neilmed Sinus Rinse.  Use distilled water.   - Use Rhinocort 1-2 sprays each nostril daily. Aim upward and outward. - Hold all anti histamines (Allegra, Benadryl, Zyrtec, Claritin, Xyzal) 3 days prior to next visit for skin testing 1-55.    Recurrent Sinus Infections - Will obtain labwork to test  your immune system due to history of recurrent sinus infections requiring antibiotics.    Mild Intermittent Asthma: - Controlled, normal spirometry today.  - Maintenance inhaler: none - Rescue inhaler: Albuterol 2 puffs via spacer or 1 vial via nebulizer every 4-6 hours as needed for respiratory symptoms of cough, shortness of breath, or wheezing Asthma control goals:  Full participation in all desired activities (may need albuterol before activity) Albuterol use two times or less a week on average (not counting use with activity) Cough interfering with sleep two times or less a month Oral steroids no more than once a year No hospitalizations   GERD - Use Omeprazole 10mg  daily in morning on an empty stomach.  Eat about 30-45 minutes later. - Use Pepcid (Famotidine) 20mg  twice daily as needed for reflux/heartburn.  -Avoid lying down for at least two hours after a meal or after drinking acidic beverages, like  soda, or other caffeinated beverages. This can help to prevent stomach contents from flowing back into the esophagus. -Keep your head elevated while you sleep. Using an extra pillow or two can also help to prevent reflux. -Eat smaller and more frequent meals each day instead of a few large meals. This promotes digestion and can aid in preventing heartburn. -Wear loose-fitting clothes to ease pressure on the stomach, which can worsen heartburn and reflux. -Reduce excess weight around the midsection. This can ease pressure on the stomach. Such pressure can force some stomach contents back up the esophagus.    Follow up: Friday 05/31/2023, overbook for 8:30 AM with me for skin testing.      Return in about 4 days (around 05/31/2023) for Overbook for 8:30 AM with me for skin testing.Alesia Morin, MD [facility]

## 2023-05-28 ENCOUNTER — Ambulatory Visit: Payer: Medicare Other | Admitting: Internal Medicine

## 2023-05-29 NOTE — Addendum Note (Signed)
Addended by: Elsworth Soho on: 05/29/2023 05:17 PM   Modules accepted: Orders

## 2023-05-31 ENCOUNTER — Other Ambulatory Visit: Payer: Self-pay | Admitting: Internal Medicine

## 2023-05-31 ENCOUNTER — Other Ambulatory Visit: Payer: Self-pay

## 2023-05-31 ENCOUNTER — Ambulatory Visit: Payer: Medicare Other | Admitting: Internal Medicine

## 2023-05-31 DIAGNOSIS — J329 Chronic sinusitis, unspecified: Secondary | ICD-10-CM

## 2023-05-31 MED ORDER — PNEUMOCOCCAL VAC POLYVALENT 25 MCG/0.5ML IJ INJ: 0.5000 mL | INJECTION | Freq: Once | INTRAMUSCULAR | 0 refills | Status: AC

## 2023-05-31 NOTE — Progress Notes (Signed)
Order for repeat S pneumo titers.

## 2023-05-31 NOTE — Progress Notes (Signed)
Pneumovax order sent

## 2023-06-06 ENCOUNTER — Encounter: Payer: Self-pay | Admitting: Internal Medicine

## 2023-06-06 NOTE — Telephone Encounter (Signed)
There was an opening for allergy testing on August 19th at 9:00am with Dr. Allena Katz in [place]. I called and spoke with the patient and she was able to do that date and time. I did advise to go ahead and stop all antihistamines to be prepared for Monday, patient verbalized understanding.

## 2023-06-10 ENCOUNTER — Other Ambulatory Visit: Payer: Self-pay

## 2023-06-10 ENCOUNTER — Encounter: Payer: Self-pay | Admitting: Internal Medicine

## 2023-06-10 ENCOUNTER — Ambulatory Visit (INDEPENDENT_AMBULATORY_CARE_PROVIDER_SITE_OTHER): Payer: 59 | Admitting: Internal Medicine

## 2023-06-10 VITALS — BP 102/78 | HR 83 | Temp 97.7°F | Resp 16 | Ht 64.41 in | Wt 176.8 lb

## 2023-06-10 DIAGNOSIS — J3089 Other allergic rhinitis: Secondary | ICD-10-CM | POA: Diagnosis not present

## 2023-06-10 DIAGNOSIS — J329 Chronic sinusitis, unspecified: Secondary | ICD-10-CM | POA: Diagnosis not present

## 2023-06-10 MED ORDER — AZELASTINE HCL 0.1 % NA SOLN: 2.0000 | Freq: Two times a day (BID) | NASAL | 5 refills | Status: DC | PRN

## 2023-06-10 NOTE — Patient Instructions (Addendum)
Chronic Rhinitis:  - Positive skin test 05/2023: none - Use nasal saline rinses before nose sprays such as with Neilmed Sinus Rinse.  Use distilled water.   - Use Rhinocort 1-2 sprays each nostril daily. Aim upward and outward. - Use Azelastine 1-2 sprays each nostril twice daily as needed for runny nose, drainage, sneezing, congestion. Aim upward and outward. - Use Allegra 180mg  daily.    Recurrent Sinus Infections - Low S pneumo titers.  Normal immunoglobulins and protein response with tetanus/diptheria. Pneumovax given 05/31/2023.  Please obtain S pneumo titers in about 6-8 weeks, between 07/12/2023-07/26/2023.   Mild Intermittent Asthma: - Maintenance inhaler: none - Rescue inhaler: Albuterol 2 puffs via spacer or 1 vial via nebulizer every 4-6 hours as needed for respiratory symptoms of cough, shortness of breath, or wheezing Asthma control goals:  Full participation in all desired activities (may need albuterol before activity) Albuterol use two times or less a week on average (not counting use with activity) Cough interfering with sleep two times or less a month Oral steroids no more than once a year No hospitalizations   GERD - Use Omeprazole 10mg  daily in morning on an empty stomach.  Eat about 30-45 minutes later. - Use Pepcid (Famotidine) 20mg  twice daily as needed for reflux/heartburn.  -Avoid lying down for at least two hours after a meal or after drinking acidic beverages, like soda, or other caffeinated beverages. This can help to prevent stomach contents from flowing back into the esophagus. -Keep your head elevated while you sleep. Using an extra pillow or two can also help to prevent reflux. -Eat smaller and more frequent meals each day instead of a few large meals. This promotes digestion and can aid in preventing heartburn. -Wear loose-fitting clothes to ease pressure on the stomach, which can worsen heartburn and reflux. -Reduce excess weight around the midsection. This  can ease pressure on the stomach. Such pressure can force some stomach contents back up the esophagus.

## 2023-06-10 NOTE — Progress Notes (Signed)
FOLLOW UP Date of Service/Encounter:  06/10/23   Subjective:  Julia Stanley (DOB: Jun 06, 1986) is a 37 y.o. female who [facility] for follow up for skin testing.   History obtained from: chart review and patient.  Has gotten her pneumovax shot and has the [facility] form for repeat titers. Doing well overall. Off anti histamines.   Past Medical History: Past Medical History:  Diagnosis Date   Abnormal Pap smear    Anxiety    Frequent headaches    Nicotine addiction 10/27/2013   Dips since age 10   Polyp of colon    Seasonal allergies    Urinary frequency 08/15/2015   UTI (lower urinary tract infection) 08/15/2015   Vaginal discharge 03/10/2014    Objective:  BP 102/78   Pulse 83   Temp 97.7 F (36.5 C) (Temporal)   Resp 16   Ht 5' 4.41" (1.636 m)   Wt 176 lb 12.8 oz (80.2 kg)   SpO2 97%   BMI 29.96 kg/m  Body mass index is 29.96 kg/m. Physical Exam: GEN: alert, well developed HEENT: clear conjunctiva, MMM HEART: regular rate  LUNGS:  no coughing, unlabored respiration SKIN: no rashes or lesions  Skin Testing:  Skin prick testing was placed, which includes aeroallergens/foods, histamine control, and saline control.  Verbal consent was obtained prior to placing test.  Patient tolerated procedure well.  Allergy testing results were read and interpreted by myself, documented by clinical staff. Adequate positive and negative control.  Positive results to:  Results discussed with patient/family.  Airborne Adult Perc - 06/10/23 0900     Time Antigen Placed 1610    Allergen Manufacturer Waynette Buttery    Location Back    Number of Test 55    1. Control-Buffer 50% Glycerol Negative    2. Control-Histamine 3+    3. Bahia Negative    4. French Southern Territories Negative    5. Johnson Negative    6. Kentucky Blue Negative    7. Meadow Fescue Negative    8. Perennial Rye Negative    9. Timothy Negative    10. Ragweed Mix Negative    11. Cocklebur  Negative    12. Plantain,  English Negative    13. Baccharis Negative    14. Dog Fennel Negative    15. Russian Thistle Negative    16. Lamb's Quarters Negative    17. Sheep Sorrell Negative    18. Rough Pigweed Negative    19. Marsh Elder, Rough Negative    20. Mugwort, Common Negative    21. Box, Elder Negative    22. Cedar, red Negative    23. Sweet Gum Negative    24. Pecan Pollen Negative    25. Pine Mix Negative    26. Walnut, Black Pollen Negative    27. Red Mulberry Negative    28. Ash Mix Negative    29. Birch Mix Negative    30. Beech American Negative    31. Cottonwood, Guinea-Bissau Negative    32. Hickory, White Negative    33. Maple Mix Negative    34. Oak, Guinea[place] Mix Negative    35. Sycamore Eastern Negative    36. Alternaria Alternata Negative    37. Cladosporium Herbarum Negative    38. Aspergillus Mix Negative    39. Penicillium Mix Negative    40. Bipolaris Sorokiniana (Helminthosporium) Negative    41. Drechslera Spicifera (Curvularia) Negative    42. Mucor Plumbeus Negative  43. Fusarium Moniliforme Negative    44. Aureobasidium Pullulans (pullulara) Negative    45. Rhizopus Oryzae Negative    46. Botrytis Cinera Negative    47. Epicoccum Nigrum Negative    48. Phoma Betae Negative    49. Dust Mite Mix Negative    50. Cat Hair 10,000 BAU/ml Negative    51.  Dog Epithelia Negative    52. Mixed Feathers Negative    53. Horse Epithelia Negative    54. Cockroach, German Negative    55. Tobacco Leaf Negative             Intradermal - 06/10/23 0900     Time Antigen Placed 9604    Allergen Manufacturer Waynette Buttery    Location Back    Number of Test 16    Control Negative    Bahia Negative    French Southern Territories Negative    Johnson Negative    7 Grass Negative    Ragweed Mix Negative    Weed Mix Negative    Tree Mix Negative    Mold 1 Negative    Mold 2 Negative    Mold 3 Negative    Mold 4 Negative    Mite Mix Negative    Cat Negative    Dog Negative     Cockroach Negative              Assessment:   1. Other allergic rhinitis   2. Recurrent sinus infections     Plan/Recommendations:  Other Allergic Rhinitis: - Due to turbinate hypertrophy, seasonal symptoms, recurrent sinus infections and unresponsive to over the counter meds, performed skin testing to identify aeroallergen triggers.   - Positive skin test 05/2023: none - Use nasal saline rinses before nose sprays such as with Neilmed Sinus Rinse.  Use distilled water.   - Use Rhinocort 1-2 sprays each nostril daily. Aim upward and outward. - Use Azelastine 1-2 sprays each nostril twice daily as needed for runny nose, drainage, sneezing, congestion. Aim upward and outward. - Use Allegra 180mg  daily.    Recurrent Sinus Infections - Low S pneumo titers.  Normal immunoglobulins and protein response with tetanus/diptheria. Pneumovax given 05/31/2023.  Please obtain S pneumo titers in about 6-8 weeks, between 07/12/2023-07/26/2023.   Mild Intermittent Asthma: - Maintenance inhaler: none - Rescue inhaler: Albuterol 2 puffs via spacer or 1 vial via nebulizer every 4-6 hours as needed for respiratory symptoms of cough, shortness of breath, or wheezing Asthma control goals:  Full participation in all desired activities (may need albuterol before activity) Albuterol use two times or less a week on average (not counting use with activity) Cough interfering with sleep two times or less a month Oral steroids no more than once a year No hospitalizations   GERD - Use Omeprazole 10mg  daily in morning on an empty stomach.  Eat about 30-45 minutes later. - Use Pepcid (Famotidine) 20mg  twice daily as needed for reflux/heartburn.  -Avoid lying down for at least two hours after a meal or after drinking acidic beverages, like soda, or other caffeinated beverages. This can help to prevent stomach contents from flowing back into the esophagus. -Keep your head elevated while you sleep. Using an extra  pillow or two can also help to prevent reflux. -Eat smaller and more frequent meals each day instead of a few large meals. This promotes digestion and can aid in preventing heartburn. -Wear loose-fitting clothes to ease pressure on the stomach, which can worsen heartburn and reflux. -Reduce excess weight around  the midsection. This can ease pressure on the stomach. Such pressure can force some stomach contents back up the esophagus.     Return in about 2 months (around 08/10/2023).  Alesia Morin, MD [facility]

## 2023-06-11 ENCOUNTER — Encounter: Payer: Self-pay | Admitting: Internal Medicine

## 2023-07-01 ENCOUNTER — Ambulatory Visit: Payer: Medicare Other | Admitting: Internal Medicine

## 2023-07-02 ENCOUNTER — Ambulatory Visit (INDEPENDENT_AMBULATORY_CARE_PROVIDER_SITE_OTHER): Payer: 59

## 2023-07-02 DIAGNOSIS — Z3042 Encounter for surveillance of injectable contraceptive: Secondary | ICD-10-CM

## 2023-07-02 MED ORDER — MEDROXYPROGESTERONE ACETATE 150 MG/ML IM SUSY
150.0000 mg | PREFILLED_SYRINGE | Freq: Once | INTRAMUSCULAR | Status: AC
  Administered 2023-07-02: 150 mg via INTRAMUSCULAR

## 2023-07-02 NOTE — Progress Notes (Signed)
   NURSE VISIT- INJECTION  SUBJECTIVE:  Julia Stanley is a 37 y.o. G0P0 female here for a Depo Provera for contraception/period management. She is a GYN patient.   OBJECTIVE:  There were no vitals taken for this visit.  Appears well, in no apparent distress  Injection administered in: Right deltoid  Meds ordered this encounter  Medications   medroxyPROGESTERone Acetate SUSY 150 mg    ASSESSMENT: GYN patient Depo Provera for contraception/period management PLAN: Follow-up: in 11-13 weeks for next Depo   Caralyn Guile  07/02/2023 11:17 AM

## 2023-07-03 ENCOUNTER — Ambulatory Visit: Payer: 59

## 2023-07-06 ENCOUNTER — Encounter: Payer: Self-pay | Admitting: Orthopaedic Surgery

## 2023-08-12 ENCOUNTER — Ambulatory Visit: Payer: 59 | Admitting: Internal Medicine

## 2023-08-31 ENCOUNTER — Ambulatory Visit: Payer: 59

## 2023-08-31 ENCOUNTER — Ambulatory Visit
Admission: EM | Admit: 2023-08-31 | Discharge: 2023-08-31 | Disposition: A | Discharge status: Home / Self Care | Payer: 59 | Attending: Nurse Practitioner | Admitting: Nurse Practitioner

## 2023-08-31 DIAGNOSIS — G8929 Other chronic pain: Secondary | ICD-10-CM

## 2023-08-31 DIAGNOSIS — M25562 Pain in left knee: Secondary | ICD-10-CM | POA: Diagnosis not present

## 2023-08-31 MED ORDER — TRAMADOL HCL 50 MG PO TABS: 50.0000 mg | ORAL_TABLET | Freq: Two times a day (BID) | ORAL | 0 refills | Status: DC | PRN
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
## 2023-08-31 NOTE — ED Notes (Signed)
Rounding on patient. No needs at this time.

## 2023-08-31 NOTE — Discharge Instructions (Addendum)
X-ray of the left knee is negative for fracture or dislocation.  Does show some abnormalities in the spacing between your joints which may be contributing to your symptoms.  As discussed, it is recommended that you have an MRI of your knee for further evaluation. May take Tylenol Arthritis Strength for pain or discomfort. RICE therapy rest, ice, compression, and elevation until your symptoms improve.  Apply ice for 20 minutes, remove for 1 hour, then repeat as often as possible.  This will help with pain and swelling. Wear the leg brace when you are engaged in strenuous or prolonged activity. Recommend follow-up with orthopedics for continued pain or worsening symptoms.
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
## 2023-09-05 ENCOUNTER — Other Ambulatory Visit: Payer: Self-pay | Admitting: Orthopedic Surgery

## 2023-09-05 DIAGNOSIS — S83242A Other tear of medial meniscus, current injury, left knee, initial encounter: Secondary | ICD-10-CM

## 2023-09-22 ENCOUNTER — Ambulatory Visit
Admission: RE | Admit: 2023-09-22 | Discharge: 2023-09-22 | Disposition: A | Discharge status: Home / Self Care | Payer: 59 | Source: Ambulatory Visit | Attending: Orthopedic Surgery | Admitting: Orthopedic Surgery

## 2023-09-22 DIAGNOSIS — S83242A Other tear of medial meniscus, current injury, left knee, initial encounter: Secondary | ICD-10-CM

## 2023-09-24 ENCOUNTER — Ambulatory Visit: Payer: 59

## 2023-09-24 DIAGNOSIS — Z3042 Encounter for surveillance of injectable contraceptive: Secondary | ICD-10-CM

## 2023-09-24 MED ORDER — MEDROXYPROGESTERONE ACETATE 150 MG/ML IM SUSY
150.0000 mg | PREFILLED_SYRINGE | Freq: Once | INTRAMUSCULAR | Status: AC
  Administered 2023-09-24: 150 mg via INTRAMUSCULAR

## 2023-09-24 NOTE — Progress Notes (Signed)
   NURSE VISIT- INJECTION  SUBJECTIVE:  Julia Stanley is a 37 y.o. G0P0 female here for a Depo Provera for contraception/period management. She is a GYN patient.   OBJECTIVE:  There were no vitals taken for this visit.  Appears well, in no apparent distress  Injection administered in: Right deltoid  Meds ordered this encounter  Medications   medroxyPROGESTERone Acetate SUSY 150 mg    ASSESSMENT: GYN patient Depo Provera for contraception/period management PLAN: Follow-up: in 11-13 weeks for next Depo   Caralyn Guile  09/24/2023 10:06 AM

## 2023-09-30 ENCOUNTER — Other Ambulatory Visit: Payer: 59

## 2023-10-22 ENCOUNTER — Encounter: Payer: Self-pay | Admitting: Obstetrics & Gynecology

## 2023-11-04 ENCOUNTER — Ambulatory Visit (INDEPENDENT_AMBULATORY_CARE_PROVIDER_SITE_OTHER): Payer: Medicare PPO | Admitting: Adult Health

## 2023-11-04 ENCOUNTER — Ambulatory Visit: Payer: Medicare PPO | Admitting: Obstetrics & Gynecology

## 2023-11-04 ENCOUNTER — Encounter: Payer: Self-pay | Admitting: Adult Health

## 2023-11-04 ENCOUNTER — Ambulatory Visit: Payer: Medicare PPO | Admitting: Adult Health

## 2023-11-04 VITALS — BP 127/81 | HR 78 | Ht 65.0 in | Wt 187.0 lb

## 2023-11-04 DIAGNOSIS — R635 Abnormal weight gain: Secondary | ICD-10-CM | POA: Diagnosis not present

## 2023-11-04 DIAGNOSIS — Z3042 Encounter for surveillance of injectable contraceptive: Secondary | ICD-10-CM | POA: Insufficient documentation

## 2023-11-04 NOTE — Progress Notes (Signed)
  Subjective:     Patient ID: Julia Stanley, female   DOB: 26-Jan-1986, 38 y.o.   MRN: [id number]  HPI Julia Stanley is a 38 year old white female,single, G1P0010 in complaining of weight gain with depo, has gained 40 lbs since December 2023. Has pain in back and knees, needs to lose this weight, thinks it is related to depo. Wants hysterectomy. Does not want to be pregnant ever.      Component Value Date/Time   DIAGPAP  10/03/2022 1124    - Negative for intraepithelial lesion or malignancy (NILM)   HPVHIGH Negative 10/03/2022 1124   ADEQPAP  10/03/2022 1124    Satisfactory for evaluation; transformation zone component PRESENT.    PCP is L Stader NP  Review of Systems +weight gain Has pain back and knees  Reviewed past medical,surgical, social and family history. Reviewed medications and allergies.     Objective:   Physical Exam BP 127/81 (BP Location: Left Arm, Patient Position: Sitting, Cuff Size: Normal)   Pulse 78   Ht 5' 5 (1.651 m)   Wt 187 lb (84.8 kg)   BMI 31.12 kg/m     Skin warm and dry. Lungs: clear to ausculation bilaterally. Cardiovascular: regular rate and rhythm.   Upstream - 11/04/23 1435       Pregnancy Intention Screening   Does the patient want to become pregnant in the next year? No    Does the patient's partner want to become pregnant in the next year? No    Would the patient like to discuss contraceptive options today? No      Contraception Wrap Up   Current Method Hormonal Injection    End Method Hormonal Injection    Contraception Counseling Provided Yes             Assessment:      1. Weight gain (Primary) Has gained about 40 lbs   2. Depo-Provera  contraceptive status Next injection 12/17/23    Does not want to get pregnant Discussed tubal and ablation as option  Plan:     Return about 11/21/23 to see Dr Ozan to discuss tubal and endometrial ablation as option

## 2023-11-05 ENCOUNTER — Ambulatory Visit: Payer: Medicare PPO | Admitting: Obstetrics & Gynecology

## 2023-11-06 ENCOUNTER — Ambulatory Visit: Payer: Medicare PPO | Admitting: Obstetrics & Gynecology

## 2023-11-12 ENCOUNTER — Ambulatory Visit: Payer: 59 | Admitting: Obstetrics & Gynecology

## 2023-11-13 ENCOUNTER — Ambulatory Visit: Payer: Medicare PPO | Admitting: Obstetrics & Gynecology

## 2023-11-13 ENCOUNTER — Other Ambulatory Visit (HOSPITAL_COMMUNITY)
Admission: RE | Admit: 2023-11-13 | Discharge: 2023-11-13 | Disposition: A | Discharge status: Home / Self Care | Payer: Medicare PPO | Source: Ambulatory Visit | Attending: Obstetrics & Gynecology | Admitting: Obstetrics & Gynecology

## 2023-11-13 ENCOUNTER — Encounter: Payer: Self-pay | Admitting: Obstetrics & Gynecology

## 2023-11-13 VITALS — BP 129/77 | HR 96 | Ht 65.0 in | Wt 190.0 lb

## 2023-11-13 DIAGNOSIS — Z3202 Encounter for pregnancy test, result negative: Secondary | ICD-10-CM

## 2023-11-13 DIAGNOSIS — R635 Abnormal weight gain: Secondary | ICD-10-CM

## 2023-11-13 DIAGNOSIS — N939 Abnormal uterine and vaginal bleeding, unspecified: Secondary | ICD-10-CM | POA: Diagnosis not present

## 2023-11-13 DIAGNOSIS — Z3009 Encounter for other general counseling and advice on contraception: Secondary | ICD-10-CM | POA: Diagnosis not present

## 2023-11-13 LAB — POCT URINE PREGNANCY: Preg Test, Ur: NEGATIVE

## 2023-11-13 NOTE — Progress Notes (Signed)
GYN VISIT Patient name: Julia Stanley MRN [id number]  Date of birth: 12-08-1985 Chief Complaint:   Pre-op Exam (Discuss tubal and ablation)  History of Present Illness:   Julia Stanley is a 38 y.o. G83P0010  female being seen today for the following concerns:  AUB: She wants to review her options as she reports considerable weight gain with Depot. Tried Nexplanon- noted significant GI symptoms including nausea/vomiting.  Tried IUD, but had pregnancy and not willing to have this form of contraception.  Without hormonal options- menses are very heavy and irregular.  Typically menses may last at least up to 1-2 weeks.  Menses very heavy having to change pad frequently with passage of large clots.  +dysmenorrhea  Wishes to review more permanent options as she does not desire a pregnancy  No LMP recorded. Patient has had an injection.   Review of Systems:   Pertinent items are noted in HPI Denies fever/chills, dizziness, headaches, visual disturbances, fatigue, shortness of breath, chest pain, abdominal pain, vomiting Pertinent History Reviewed:   Past Surgical History:  Procedure Laterality Date   COLONOSCOPY N/A 04/11/2015   SLF: 1. No source for diarrhea/ abdominal pain identified. 2. One large polyp removed. 3. Large internal hemorrhoids. ADENOMA. Next surveillance 2021   HEMORRHOID BANDING N/A 04/11/2015   Procedure: HEMORRHOID BANDING;  Surgeon: West Bali, MD;  Location: [facility];  Service: Endoscopy;  Laterality: N/A;   None     polyp removed  10/22/2014   SKIN CANCER EXCISION  2023    Past Medical History:  Diagnosis Date   Abnormal Pap smear    Anxiety    Frequent headaches    Nicotine addiction 10/27/2013   Dips since age 6   Polyp of colon    Seasonal allergies    Urinary frequency 08/15/2015   UTI (lower urinary tract infection) 08/15/2015   Vaginal discharge 03/10/2014   Reviewed problem list, medications and allergies. Physical Assessment:    Vitals:   11/13/23 1443  BP: 129/77  Pulse: 96  Weight: 190 lb (86.2 kg)  Height: 5\' 5"  (1.651 m)  Body mass index is 31.62 kg/m.       Physical Examination:   General appearance: alert, well appearing, and in no distress  Psych: mood appropriate, normal affect  Skin: warm & dry   Cardiovascular: normal heart rate noted  Respiratory: normal respiratory effort, no distress  Abdomen: obese, soft, non-tender   Pelvic: VULVA: normal appearing vulva with no masses, tenderness or lesions, VAGINA: normal appearing vagina with normal color and discharge, no lesions, CERVIX: normal appearing cervix without discharge or lesions, UTERUS: uterus is normal size, shape, consistency and nontender, ADNEXA: normal adnexa in size, nontender and no masses  Extremities: no edema   Chaperone: Faith Rogue    Endometrial Biopsy Procedure Note  Pre-operative Diagnosis: AUB  Post-operative Diagnosis: same  Procedure Details   Urine pregnancy test was negative.  The risks (including infection, bleeding, pain, and uterine perforation) and benefits of the procedure were explained to the patient and Written informed consent was obtained.  Antibiotic prophylaxis against endocarditis was not indicated.   The patient was placed in the dorsal lithotomy position.  Bimanual exam showed the uterus to be in the neutral position.  A speculum inserted in the vagina, and the cervix prepped with betadine.     A single tooth tenaculum was applied to the anterior lip of the cervix for stabilization.  Os finder was used.  A Pipelle endometrial aspirator  was used to sample the endometrium.  Sample was sent for pathologic examination.  Condition: Stable  Complications: None  Assessment & Plan:  1) Contraceptive management, AUB -Will plan for pelvic ultrasound to rule out underlying etiology -Also plan for EMB today, the above note, pathology pending -Reviewed all options including continued Depo,  IUD -Discussed surgical intervention including bilateral salpingectomy with endometrial ablation.  Reviewed risk benefits including risk of bleeding, infection, injury.  Discussed that risks are low based on pelvic anatomy -Discussed permanent intervention with hysterectomy.  Reviewed risk benefits indications including but not limited to risk of bleeding, infection, injury, surgical complications such as fistula cuff dehiscence or other postop complication such as DVT, PE or death.  Reviewed that while hysterectomy has greater benefit, there are also higher risk of these complications.  Also discussed that recovery from hysterectomy is 8 weeks, whereas recovery from salpingectomy ablation is typically 2 weeks -After much discussion regarding the above information patient wishes to return with family member to help her make an informed decision  -Continue Depot for now until next management step is determined  Orders Placed This Encounter  Procedures   US PELVIC COMPLETE WITH TRANSVAGINAL   POCT urine pregnancy    Return for pelvic US next available, then visit with Chelsye Suhre after.   Myna Hidalgo, DO Attending Obstetrician & Gynecologist, [facility] for [employer], [facility]

## 2023-11-14 ENCOUNTER — Ambulatory Visit: Payer: Medicare PPO | Admitting: Obstetrics & Gynecology

## 2023-11-15 LAB — SURGICAL PATHOLOGY

## 2023-11-16 ENCOUNTER — Encounter: Payer: Self-pay | Admitting: Obstetrics & Gynecology

## 2023-11-21 ENCOUNTER — Other Ambulatory Visit: Payer: Self-pay | Admitting: Adult Health

## 2023-11-22 ENCOUNTER — Ambulatory Visit: Payer: Medicare PPO

## 2023-11-22 ENCOUNTER — Encounter: Payer: Self-pay | Admitting: Obstetrics & Gynecology

## 2023-11-22 ENCOUNTER — Ambulatory Visit: Payer: Medicare PPO | Admitting: Obstetrics & Gynecology

## 2023-11-22 VITALS — BP 103/69 | HR 94 | Ht 65.0 in | Wt 190.0 lb

## 2023-11-22 DIAGNOSIS — N939 Abnormal uterine and vaginal bleeding, unspecified: Secondary | ICD-10-CM

## 2023-11-22 NOTE — Progress Notes (Signed)
PELVIC US TA/TV: Homogeneous anteverted uterus,WNL,EEC 3.4 mm,normal ovaries,ovaries appear mobile,no free fluid,no pain during ultrasound  Chaperone [facility]

## 2023-11-22 NOTE — Progress Notes (Signed)
GYN VISIT Patient name: Julia Stanley MRN [id number]  Date of birth: 17-Aug-1986 Chief Complaint:   Follow-up  History of Present Illness:   Julia Stanley is a 38 y.o. G7P0010 female being seen today for follow up regarding:  AUB: In review, she reports considerable weight gain with Depo and wishes to review management options regarding her periods.  She has tried both Nexplanon and IUD in the past with significant side effects.  Without intervention menses are heavy and irregular typically lasting for up to 1 to 2 weeks with heavy menses and significant dysmenorrhea.  [place] today:    Homogeneous anteverted uterus,WNL,EEC 3.4 mm,normal ovaries,ovaries appear mobile,no free fluid,no pain during ultrasound   She reports no acute changes since her last visit.  She presents today with her mom to review her management options  No LMP recorded. Patient has had an injection.    Review of Systems:   Pertinent items are noted in HPI Denies fever/chills, dizziness, headaches, visual disturbances, fatigue, shortness of breath, chest pain, abdominal pain, vomiting, Pertinent History Reviewed:   Past Surgical History:  Procedure Laterality Date   COLONOSCOPY N/A 04/11/2015   SLF: 1. No source for diarrhea/ abdominal pain identified. 2. One large polyp removed. 3. Large internal hemorrhoids. ADENOMA. Next surveillance 2021   HEMORRHOID BANDING N/A 04/11/2015   Procedure: HEMORRHOID BANDING;  Surgeon: West Bali, MD;  Location: [facility];  Service: Endoscopy;  Laterality: N/A;   None     polyp removed  10/22/2014   SKIN CANCER EXCISION  2023    Past Medical History:  Diagnosis Date   Abnormal Pap smear    Anxiety    Frequent headaches    Nicotine addiction 10/27/2013   Dips since age 53   Polyp of colon    Seasonal allergies    Urinary frequency 08/15/2015   UTI (lower urinary tract infection) 08/15/2015   Vaginal discharge 03/10/2014   Reviewed problem list, medications and  allergies. Physical Assessment:   Vitals:   11/22/23 1036  BP: 103/69  Pulse: 94  Weight: 190 lb (86.2 kg)  Height: 5\' 5"  (1.651 m)  Body mass index is 31.62 kg/m.       Physical Examination:   General appearance: alert, well appearing, and in no distress  Psych: mood appropriate, normal affect  Skin: warm & dry   Cardiovascular: normal heart rate noted  Respiratory: normal respiratory effort, no distress  Abdomen: soft, non-tender, no rebound, no guarding  Pelvic: examination not indicated  Extremities: no edema   Chaperone: N/A    Assessment & Plan:  1) AUB -Reviewed today's ultrasound which revealed no underlying abnormalities.  And discussed EMB with benign results -At this point she has failed medical management and wishes to pursue permanent surgical intervention via hysterctomy -Options reviewed regarding all management options including IUD, endometrial ablation with bilateral salpingectomy or laparoscopic hysterectomy -Again today the following information was reviewed: -Plan for robotic assisted laparoscopic hysterectomy, bilateral salpingectomy, possible cystoscopy -Explained that this surgery is performed to remove the uterus through several small incisions in the abdomen- pending anatomy 3-5 ports. I discussed the risks and benefits of the surgery, including, but not limited to risk of bleeding, including the need for blood transfusion, infection, damage to surrounding organs and tissues such as damage to bladder, ureter or bowel that would requiring additional procedures.  Reviewed long term complications such as fistula or dehiscence requiring further surgical intervention.  Discussed possible need for conversion to an open procedure  and potential for other complications that cannot be predicted or prevented including DVT, PE or death. -reviewed same day procedure -typical recovery at least 8 wks with several weeks off work and 8wks of pelvic rest -questions/concerns  were addressed and pt desires to proceed -plan to schedule for March 26  Orders Placed This Encounter  Procedures   Ambulatory Referral For Surgery Scheduling    Return for TBD.   Myna Hidalgo, DO Attending Obstetrician & Gynecologist, [facility] for [employer], [facility]

## 2023-11-26 ENCOUNTER — Encounter: Payer: Self-pay | Admitting: Obstetrics & Gynecology

## 2023-12-07 ENCOUNTER — Other Ambulatory Visit: Payer: Self-pay | Admitting: Adult Health

## 2023-12-17 ENCOUNTER — Ambulatory Visit: Payer: Medicare PPO | Admitting: *Deleted

## 2023-12-17 DIAGNOSIS — Z3042 Encounter for surveillance of injectable contraceptive: Secondary | ICD-10-CM | POA: Diagnosis not present

## 2023-12-17 MED ORDER — MEDROXYPROGESTERONE ACETATE 150 MG/ML IM SUSY
150.0000 mg | PREFILLED_SYRINGE | Freq: Once | INTRAMUSCULAR | Status: AC
  Administered 2023-12-17: 150 mg via INTRAMUSCULAR

## 2023-12-17 NOTE — Progress Notes (Signed)
   NURSE VISIT- INJECTION  SUBJECTIVE:  Julia Stanley is a 38 y.o. G30P0010 female here for a Depo Provera for contraception/period management. She is a GYN patient.   OBJECTIVE:  There were no vitals taken for this visit.  Appears well, in no apparent distress  Injection administered in: Right deltoid  Meds ordered this encounter  Medications   medroxyPROGESTERone Acetate SUSY 150 mg    ASSESSMENT: GYN patient Depo Provera for contraception/period management PLAN: Follow-up:  pt is scheduled for hyst in March; no need for any more Depo's.     Malachy Mood  12/17/2023 12:10 PM

## 2024-01-10 NOTE — Patient Instructions (Addendum)
 Your procedure is scheduled on: 01/15/2024  Report to [facility] Main Entrance at  6:45   AM.  Call this number if you have problems the morning of surgery: [phone]   Remember:   Do not Eat after midnight, You may have CLEAR liquids until 4:45 am Water,tea, Black coffee NO CREAM OR MILK         No Smoking the morning of surgery  :  Take these medicines the morning of surgery with A SIP OF WATER: Pepcid, Allegra, Zoloft, Zofran and/or Fioricet if needed   Do not wear jewelry, make-up or nail polish.  Do not wear lotions, powders, or perfumes. You may wear deodorant.  Do not shave 48 hours prior to surgery. Men may shave face and neck.  Do not bring valuables to the hospital.  Contacts, dentures or bridgework may not be worn into surgery.  Leave suitcase in the car. After surgery it may be brought to your room.  For patients admitted to the hospital, checkout time is 11:00 AM the day of discharge.   Patients discharged the day of surgery will not be allowed to drive home.    Special Instructions: Shower using CHG night before surgery and shower the day of surgery use CHG.  Use special wash - you have one bottle of CHG for all showers.  You should use approximately 1/2 of the bottle for each shower. How to Use Chlorhexidine at Home in the Shower Chlorhexidine gluconate (CHG) is a germ-killing (antiseptic) wash that's used to clean the skin. It can get rid of the germs that normally live on the skin and can keep them away for about 24 hours. If you're having surgery, you may be told to shower with CHG at home the night before surgery. This can help lower your risk for infection. To use CHG wash in the shower, follow the steps below. Supplies needed: CHG body wash. Clean washcloth. Clean towel. How to use CHG in the shower Follow these steps unless you're told to use CHG in a different way: Start the shower. Use your normal soap and shampoo to wash your face and hair. Turn off  the shower or move out of the shower stream. Pour CHG onto a clean washcloth. Do not use any type of brush or rough sponge. Start at your neck, washing your body down to your toes. Make sure you: Wash the part of your body where the surgery will be done for at least 1 minute. Do not scrub. Do not use CHG on your head or face unless your health care provider tells you to. If it gets into your ears or eyes, rinse them well with water. Do not wash your genitals with CHG. Wash your back and under your arms. Make sure to wash skin folds. Let the CHG sit on your skin for 1-2 minutes or as long as told. Rinse your entire body in the shower, including all body creases and folds. Turn off the shower. Dry off with a clean towel. Do not put anything on your skin afterward, such as powder, lotion, or perfume. Put on clean clothes or pajamas. If it's the night before surgery, sleep in clean sheets. General tips Use CHG only as told, and follow the instructions on the label. Use the full amount of CHG as told. This is often one bottle. Do not smoke and stay away from flames after using CHG. Your skin may feel sticky after using CHG. This is normal. The sticky feeling  will go away as the CHG dries. Do not use CHG: If you have a chlorhexidine allergy or have reacted to chlorhexidine in the past. On open wounds or areas of skin that have broken skin, cuts, or scrapes. On babies younger than 8 months of age. Contact a health care provider if: You have questions about using CHG. Your skin gets irritated or itchy. You have a rash after using CHG. You swallow any CHG. Call your local poison control center [phone] in the [place] Your eyes itch badly, or they become very red or swollen. Your hearing changes. You have trouble seeing. If you can't reach your provider, go to an urgent care or emergency room. Do not drive yourself. Get help right away if: You have swelling or tingling in your mouth or  throat. You make high-pitched whistling sounds when you breathe, most often when you breathe out (wheeze). You have trouble breathing. These symptoms may be an emergency. Call 911 right away. Do not wait to see if the symptoms will go away. Do not drive yourself to the hospital. This information is not intended to replace advice given to you by your health care provider. Make sure you discuss any questions you have with your health care provider. Document Revised: 04/23/2023 Document Reviewed: 04/19/2022 Elsevier Patient Education  2024 Else[employer] Inc. Laparoscopically Assisted  Hysterectomy, Care After After a LAVH, it's common to have soreness and numbness in your incision areas. You'll have pain in your belly. You may also have: Bleeding and discharge from the vagina. Tiredness. Sadness and other emotions. If your ovaries were taken out, you may also have symptoms of menopause, such as hot flashes, night sweats, and lack of sleep. Follow these instructions at home: Medicines Take over-the-counter and prescription medicines only as told by your health care provider. If you were given antibiotics, take them as told by your provider. Do not stop taking the antibiotic even if you start to feel better. Ask your provider if the medicine prescribed to you: Requires you to avoid driving or using machinery. Can cause constipation. You may need to take these actions to prevent or treat constipation: Drink enough fluid to keep your pee (urine) pale yellow. Take over-the-counter or prescription medicines. Eat foods that are high in fiber, such as beans, whole grains, and fresh fruits and vegetables. Limit foods that are high in fat and processed sugars, such as fried or sweet foods. Incision care  Follow instructions from your provider about how to take care of your incisions. Make sure you: Wash your hands with soap and water for at least 20 seconds before and after you change your bandage. If  soap and water aren't available, use hand sanitizer. Change your dressing as told by your provider. Leave stitches, skin glue, or tape strips in place. These skin closures may need to stay in place for 2 weeks or longer. If tape strip edges start to loosen and curl up, you may trim the loose edges. Do not remove tape strips completely unless your provider tells you to do that. Check your incision areas every day for signs of infection. Check for: More redness, swelling, or pain. More fluid or blood. Warmth. Pus or a bad smell. Activity  Rest as told by your provider. Do not sit for a long time without moving. Get up to take short walks every 1-2 hours. This will improve blood flow and breathing. Ask for help if you feel weak or unsteady. You may have to avoid lifting. Ask  your provider how much you can safely lift. Return to your normal activities as told by your provider. Ask your provider what activities are safe for you. Lifestyle Do not use any products that contain nicotine or tobacco. These products include cigarettes, chewing tobacco, and vaping devices, such as e-cigarettes. These can delay healing after surgery. If you need help quitting, ask your provider. Do not drink alcohol until your provider approves. General instructions Do not douche, use tampons, have sex, or put anything in the vagina for at least 6 weeks. If you struggle with physical or emotional changes after your procedure, speak with your provider or a therapist. Do not take baths, swim, or use a hot tub until your provider approves. Ask your provider if you may take showers. You may only be allowed to take sponge baths. Try to have someone at home with you for the first 1-2 weeks to help with your daily chores. Wear compression stockings as told by your provider. These stockings help to prevent blood clots and reduce swelling in your legs. Your provider may give you more instructions. Make sure you know what you can  and can't do. Contact a health care provider if: You have any signs of infection. You have pain and your pain medicine doesn't help. You feel dizzy or light-headed. You have trouble peeing. You vomit or feel like you may vomit, and the symptoms do not go away. You have pus or discharge from your vagina that smells bad. Get help right away if: You have a fever and your symptoms suddenly get worse. You have very bad pain in the abdomen. You have chest pain or shortness of breath. You faint. You have pain, swelling, or redness in your leg. You have heavy bleeding in your vagina that soaks through a pad in less than 1 hour. You see blood clots in your bleeding. These symptoms may be an emergency. Get help right away. Call 911. Do not wait to see if the symptoms will go away. Do not drive yourself to the hospital. This information is not intended to replace advice given to you by your health care provider. Make sure you discuss any questions you have with your health care provider. Document Revised: 01/18/2023 Document Reviewed: 01/18/2023 Elsevier Patient Education  2024 Elsevier Inc. Cystoscopy A cystoscopy may be done to find or treat a condition in your lower urinary tract. Your lower urinary tract includes your bladder and urethra. The urethra is the part of your body that drains pee (urine) from your bladder. You may need this procedure if: You have: Urinary tract infections (UTIs) that keep coming back. Blood in your pee. Pain when you pee. A blockage in your urethra, such as a urinary stone. You can't control when you pee, or you have to pee a lot. There are cells that aren't normal in your pee sample. A problem is found in your bladder during a test. You need a biopsy. This is when a small piece of tissue is removed for testing. Tell a health care provider about: Any allergies you have. All medicines you take. These include vitamins, herbs, eye drops, and creams. Any  problems you or family members have had with anesthesia. Any bleeding problems you have. Any surgeries you've had. Any medical conditions you have. Whether you're pregnant or may be pregnant. What are the risks? Your health care provider will talk with you about risks. These may include: Infection. Bleeding. Allergic reactions to medicines. Damage to your urethra or bladder.  What happens before the procedure? Medicines Ask about changing or stopping: Any medicines you take. Any vitamins, herbs, or supplements you take. Do not take aspirin or ibuprofen unless you're told to. Tests You may have an exam or tests. These may include: Pee tests to check for signs of infection. X-rays of: Your bladder. Your urethra. Your kidneys. A CT scan of your belly or hips. General instructions Eat and drink only as you've been told. For your safety, you may: Need to wash your skin with a soap that kills germs. Get antibiotics. Have hair removed at the procedure site. If you'll be going home right after the procedure, plan to have a responsible adult: Take you home from the hospital or clinic. You won't be allowed to drive. Stay with you for the time you're told. What happens during the procedure?  You may be given: A sedative to help you relax. Anesthesia to keep you from feeling pain. The opening of your urethra will be cleaned. A thin tube called a cystoscope will be put into your urethra. The tube has a light and camera on the end of it. The tube will be passed into your bladder. A germ-free (sterile) fluid will flow through the tube. The fluid will stretch your bladder. This helps your provider see the walls of your bladder more clearly. A biopsy may be taken. Stones may be removed. The tube will be taken out. Your bladder will be emptied. The procedure may vary among providers and hospitals. What can I expect after the procedure? It's common to have: Some soreness or pain in your  belly and urethra. Mild pain or burning when you pee. The pain should stop a few minutes after you pee. This may last for up to a week. A small amount of blood in your pee for a few days. A feeling like you need to pee often. But when you do, you may only pee a little. Follow these instructions at home: Medicines Take your medicines only as told. If you were given antibiotics, take them as told. Do not stop taking them even if you start to feel better. General instructions If you were given a sedative, do not drive or use machines until you're told it's safe. A sedative can make you sleepy. Eat and drink as told. If a biopsy was taken, ask when your test results will be ready and how to get them. You may need to call or meet with your provider to get your results. Ask what things are safe for you to do at home. Ask when you can go back to work or school. Contact a health care provider if: Your pain gets worse. Your pain doesn't get better with medicine. You have trouble peeing. You have more blood in your pee. You have a fever or chills. Get help right away if: You have blood clots in your pee. You can't pee. This information is not intended to replace advice given to you by your health care provider. Make sure you discuss any questions you have with your health care provider. Document Revised: 02/12/2023 Document Reviewed: 02/12/2023 Elsevier Patient Education  2024 Elsevier Inc. General Anesthesia, Adult, Care After The following information offers guidance on how to care for yourself after your procedure. Your health care provider may also give you more specific instructions. If you have problems or questions, contact your health care provider. What can I expect after the procedure? After the procedure, it is common for people to: Have pain or  discomfort at the IV site. Have nausea or vomiting. Have a sore throat or hoarseness. Have trouble concentrating. Feel cold or  chills. Feel weak, sleepy, or tired (fatigue). Have soreness and body aches. These can affect parts of the body that were not involved in surgery. Follow these instructions at home: For the time period you were told by your health care provider:  Rest. Do not participate in activities where you could fall or become injured. Do not drive or use machinery. Do not drink alcohol. Do not take sleeping pills or medicines that cause drowsiness. Do not make important decisions or sign legal documents. Do not take care of children on your own. General instructions Drink enough fluid to keep your urine pale yellow. If you have sleep apnea, surgery and certain medicines can increase your risk for breathing problems. Follow instructions from your health care provider about wearing your sleep device: Anytime you are sleeping, including during daytime naps. While taking prescription pain medicines, sleeping medicines, or medicines that make you drowsy. Return to your normal activities as told by your health care provider. Ask your health care provider what activities are safe for you. Take over-the-counter and prescription medicines only as told by your health care provider. Do not use any products that contain nicotine or tobacco. These products include cigarettes, chewing tobacco, and vaping devices, such as e-cigarettes. These can delay incision healing after surgery. If you need help quitting, ask your health care provider. Contact a health care provider if: You have nausea or vomiting that does not get better with medicine. You vomit every time you eat or drink. You have pain that does not get better with medicine. You cannot urinate or have bloody urine. You develop a skin rash. You have a fever. Get help right away if: You have trouble breathing. You have chest pain. You vomit blood. These symptoms may be an emergency. Get help right away. Call 911. Do not wait to see if the symptoms will  go away. Do not drive yourself to the hospital. Summary After the procedure, it is common to have a sore throat, hoarseness, nausea, vomiting, or to feel weak, sleepy, or fatigue. For the time period you were told by your health care provider, do not drive or use machinery. Get help right away if you have difficulty breathing, have chest pain, or vomit blood. These symptoms may be an emergency. This information is not intended to replace advice given to you by your health care provider. Make sure you discuss any questions you have with your health care provider. Document Revised: 01/05/2022 Document Reviewed: 01/05/2022 Elsevier Patient Education  2024 [employer].

## 2024-01-13 ENCOUNTER — Encounter (HOSPITAL_COMMUNITY): Payer: Self-pay

## 2024-01-13 ENCOUNTER — Other Ambulatory Visit (HOSPITAL_COMMUNITY): Payer: Medicare PPO

## 2024-01-13 ENCOUNTER — Encounter (HOSPITAL_COMMUNITY)
Admission: RE | Admit: 2024-01-13 | Discharge: 2024-01-13 | Disposition: A | Discharge status: Home / Self Care | Payer: Medicare PPO | Source: Ambulatory Visit | Attending: Obstetrics & Gynecology | Admitting: Obstetrics & Gynecology

## 2024-01-13 ENCOUNTER — Encounter: Payer: Self-pay | Admitting: Obstetrics & Gynecology

## 2024-01-13 DIAGNOSIS — Z01812 Encounter for preprocedural laboratory examination: Secondary | ICD-10-CM | POA: Insufficient documentation

## 2024-01-13 DIAGNOSIS — Z01818 Encounter for other preprocedural examination: Secondary | ICD-10-CM | POA: Diagnosis present

## 2024-01-13 HISTORY — DX: Other specified postprocedural states: R11.2

## 2024-01-13 HISTORY — DX: Other specified postprocedural states: Z98.890

## 2024-01-13 HISTORY — DX: Nausea with vomiting, unspecified: R11.2

## 2024-01-13 LAB — CBC
HCT: 37 % (ref 36.0–46.0)
Hemoglobin: 11.9 g/dL — ABNORMAL LOW (ref 12.0–15.0)
MCH: 27.9 pg (ref 26.0–34.0)
MCHC: 32.2 g/dL (ref 30.0–36.0)
MCV: 86.7 fL (ref 80.0–100.0)
Platelets: 250 10*3/uL (ref 150–400)
RBC: 4.27 MIL/uL (ref 3.87–5.11)
RDW: 13.3 % (ref 11.5–15.5)
WBC: 10 10*3/uL (ref 4.0–10.5)
nRBC: 0 % (ref 0.0–0.2)

## 2024-01-13 LAB — TYPE AND SCREEN
ABO/RH(D): O POS
Antibody Screen: NEGATIVE

## 2024-01-13 LAB — PREGNANCY, URINE: Preg Test, Ur: NEGATIVE

## 2024-01-14 NOTE — H&P (Signed)
 [facility] Attending History and Physical  Julia Stanley is a 38 y.o. G1P0010  who presents for scheduled robotic assisted laparoscopic hysterectomy and bilateral salpingectomy.  In review, she is struggled with abnormal uterine bleeding.  Menses sometimes last up to 2 weeks with heavy bleeding and dysmenorrhea.  She has tried several options including Depot, Nexplanon and IUD and noted considerable side effects.  At this point she does not desire pregnancy and wishes to consider permanent surgical intervention  Denies any abnormal vaginal discharge, fevers, chills, sweats, dysuria, nausea, vomiting, other GI or GU symptoms or other general symptoms.  Past Medical History:  Diagnosis Date   Abnormal Pap smear    Anxiety    Frequent headaches    Nicotine addiction 10/27/2013   Dips since age 32   Polyp of colon    PONV (postoperative nausea and vomiting)    Seasonal allergies    Urinary frequency 08/15/2015   UTI (lower urinary tract infection) 08/15/2015   Vaginal discharge 03/10/2014   Past Surgical History:  Procedure Laterality Date   COLONOSCOPY N/A 04/11/2015   SLF: 1. No source for diarrhea/ abdominal pain identified. 2. One large polyp removed. 3. Large internal hemorrhoids. ADENOMA. Next surveillance 2021   HEMORRHOID BANDING N/A 04/11/2015   Procedure: HEMORRHOID BANDING;  Surgeon: West Bali, MD;  Location: [facility];  Service: Endoscopy;  Laterality: N/A;   None     polyp removed  10/22/2014   SKIN CANCER EXCISION  2023   OB History  Gravida Para Term Preterm AB Living  1    1   SAB IAB Ectopic Multiple Live Births  1        # Outcome Date GA Lbr Len/2nd Weight Sex Type Anes PTL Lv  1 SAB 2024          Patient denies any other pertinent gynecologic issues.  No current facility-administered medications on file prior to encounter.   Current Outpatient Medications on File Prior to Encounter  Medication Sig Dispense Refill    albuterol (VENTOLIN HFA) 108 (90 Base) MCG/ACT inhaler Inhale 1-2 puffs into the lungs every 6 (six) hours as needed for wheezing or shortness of breath. 18 g 1   butalbital-acetaminophen-caffeine (FIORICET) 50-325-40 MG tablet Take 1 tablet by mouth every 6 (six) hours as needed for headache or migraine.     diphenhydrAMINE (BENADRYL) 25 MG tablet Take 50 mg by mouth at bedtime as needed for allergies.     famotidine (PEPCID) 20 MG tablet Take 1 tablet (20 mg total) by mouth 2 (two) times daily as needed. 60 tablet 5   fexofenadine (ALLEGRA) 180 MG tablet Take 180 mg by mouth daily. Reported on 05/09/2016     ondansetron (ZOFRAN) 4 MG tablet Take 4 mg by mouth every 8 (eight) hours as needed for nausea or vomiting.     sertraline (ZOLOFT) 100 MG tablet Take 100 mg by mouth daily.     traZODone (DESYREL) 100 MG tablet Take 100 mg by mouth at bedtime.     valACYclovir (VALTREX) 500 MG tablet Take 500 mg by mouth daily.     Allergies  Allergen Reactions   Aspirin Other (See Comments)    Nose bleeds.   Sulfa Antibiotics Hives    Childhood allergy   Nasal Spray Other (See Comments)    Nose bleeds    Social History:   reports that she has never smoked. Her smokeless tobacco use includes snuff. She reports that she  does not drink alcohol and does not use drugs. Family History  Problem Relation Age of Onset   Cancer Paternal Grandfather    Hypertension Paternal Grandmother    Asthma Maternal Grandmother    COPD Maternal Grandmother    Dementia Maternal Grandmother    Hypertension Maternal Grandfather    Hyperlipidemia Maternal Grandfather    Diabetes Maternal Grandfather    Cancer Father        bone   Colon cancer Neg Hx     Review of Systems: Pertinent items noted in HPI and remainder of comprehensive ROS otherwise negative.  PHYSICAL EXAM: There were no vitals taken for this visit. CONSTITUTIONAL: Well-developed, well-nourished female in no acute distress.  SKIN: Skin is warm and  dry. No rash noted. Not diaphoretic. No erythema. No pallor. NEUROLOGIC: Alert and oriented to person, place, and time. Normal reflexes, muscle tone coordination. No cranial nerve deficit noted. PSYCHIATRIC: Normal mood and affect. Normal behavior. Normal judgment and thought content. CARDIOVASCULAR: Normal heart rate noted, regular rhythm RESPIRATORY: Effort and breath sounds normal, no problems with respiration noted ABDOMEN: Soft, nontender, nondistended. PELVIC: deferred MUSCULOSKELETAL: no calf tenderness bilaterally EXT: no edema bilaterally, normal pulses  Labs: Results for orders placed or performed during the hospital encounter of 01/13/24 (from the past 2 weeks)  CBC   Collection Time: 01/13/24  3:13 PM  Result Value Ref Range   WBC 10.0 4.0 - 10.5 K/uL   RBC 4.27 3.87 - 5.11 MIL/uL   Hemoglobin 11.9 (L) 12.0 - 15.0 g/dL   HCT 40.9 81.1 - 91.4 %   MCV 86.7 80.0 - 100.0 fL   MCH 27.9 26.0 - 34.0 pg   MCHC 32.2 30.0 - 36.0 g/dL   RDW 78.2 95.6 - 21.3 %   Platelets 250 150 - 400 K/uL   nRBC 0.0 0.0 - 0.2 %  Pregnancy, urine   Collection Time: 01/13/24  3:13 PM  Result Value Ref Range   Preg Test, Ur NEGATIVE NEGATIVE  Type and screen [facility]   Collection Time: 01/13/24  3:13 PM  Result Value Ref Range   ABO/RH(D) O POS    Antibody Screen NEG    Sample Expiration 01/27/2024,2359    Extend sample reason      NO TRANSFUSIONS OR PREGNANCY IN THE PAST 3 MONTHS Performed at [facility], [address], [place], [place] [address]     Imaging Studies: Ultrasound completed 11-22-2023: 4.3 x 2.5 x 4.5 cm uterus- 26cc uterus.  Normal endometrium.  Normal ovaries bilaterally  Assessment: Abnormal uterine bleeding   Plan: Robotic assisted laparoscopic hysterectomy, bilateral salpingectomy, possible cystoscopy -Ancef 2 g -IV Toradol -NPO -LR @ 125cc/hr -SCDs to OR -Risk/benefits and alternatives reviewed with the patient including but not limited to risk of  bleeding, infection and injury to surrounding organs requiring further surgical intervention.  See prior note for complete informed consent.  Patient also aware of potential conversion to open procedure.  Questions and concerns were addressed and pt desires to proceed  Myna Hidalgo, DO Attending Obstetrician & Gynecologist, [facility] for [facility], [facility]

## 2024-01-15 ENCOUNTER — Telehealth: Payer: Self-pay

## 2024-01-15 ENCOUNTER — Ambulatory Visit (HOSPITAL_BASED_OUTPATIENT_CLINIC_OR_DEPARTMENT_OTHER): Admitting: Certified Registered"

## 2024-01-15 ENCOUNTER — Encounter (HOSPITAL_COMMUNITY)
Admission: RE | Disposition: A | Discharge status: Home / Self Care | Payer: Self-pay | Source: Home / Self Care | Attending: Obstetrics & Gynecology

## 2024-01-15 ENCOUNTER — Other Ambulatory Visit: Payer: Self-pay

## 2024-01-15 ENCOUNTER — Ambulatory Visit (HOSPITAL_COMMUNITY): Admitting: Certified Registered"

## 2024-01-15 ENCOUNTER — Ambulatory Visit (HOSPITAL_COMMUNITY)
Admission: RE | Admit: 2024-01-15 | Discharge: 2024-01-15 | Disposition: A | Discharge status: Home / Self Care | Payer: Medicare PPO | Attending: Obstetrics & Gynecology | Admitting: Obstetrics & Gynecology

## 2024-01-15 ENCOUNTER — Encounter (HOSPITAL_COMMUNITY): Payer: Self-pay | Admitting: Obstetrics & Gynecology

## 2024-01-15 DIAGNOSIS — N72 Inflammatory disease of cervix uteri: Secondary | ICD-10-CM | POA: Diagnosis not present

## 2024-01-15 DIAGNOSIS — S3723XA Laceration of bladder, initial encounter: Secondary | ICD-10-CM | POA: Diagnosis not present

## 2024-01-15 DIAGNOSIS — Z01818 Encounter for other preprocedural examination: Secondary | ICD-10-CM

## 2024-01-15 DIAGNOSIS — N939 Abnormal uterine and vaginal bleeding, unspecified: Secondary | ICD-10-CM

## 2024-01-15 DIAGNOSIS — F419 Anxiety disorder, unspecified: Secondary | ICD-10-CM | POA: Insufficient documentation

## 2024-01-15 DIAGNOSIS — I10 Essential (primary) hypertension: Secondary | ICD-10-CM | POA: Insufficient documentation

## 2024-01-15 DIAGNOSIS — K219 Gastro-esophageal reflux disease without esophagitis: Secondary | ICD-10-CM | POA: Insufficient documentation

## 2024-01-15 DIAGNOSIS — N9971 Accidental puncture and laceration of a genitourinary system organ or structure during a genitourinary system procedure: Secondary | ICD-10-CM | POA: Diagnosis not present

## 2024-01-15 HISTORY — PX: CYSTOSCOPY: SHX5120

## 2024-01-15 HISTORY — PX: ROBOTIC ASSISTED LAPAROSCOPIC HYSTERECTOMY AND SALPINGECTOMY: SHX6379

## 2024-01-15 LAB — ABO/RH: ABO/RH(D): O POS

## 2024-01-15 SURGERY — XI ROBOTIC ASSISTED LAPAROSCOPIC HYSTERECTOMY AND SALPINGECTOMY
Anesthesia: General | Site: Bladder

## 2024-01-15 MED ORDER — SODIUM CHLORIDE 0.9 % IR SOLN
Status: DC | PRN
Start: 2024-01-15 — End: 2024-01-15
  Administered 2024-01-15: 3000 mL
  Administered 2024-01-15: 1000 mL

## 2024-01-15 MED ORDER — ACETAMINOPHEN 325 MG PO TABS: 650.0000 mg | ORAL_TABLET | Freq: Four times a day (QID) | ORAL | 0 refills | Status: AC | PRN

## 2024-01-15 MED ORDER — FENTANYL CITRATE (PF) 100 MCG/2ML IJ SOLN
INTRAMUSCULAR | Status: DC | PRN
  Administered 2024-01-15: 100 ug via INTRAVENOUS
  Administered 2024-01-15 (×2): 50 ug via INTRAVENOUS

## 2024-01-15 MED ORDER — CHLORHEXIDINE GLUCONATE 0.12 % MT SOLN: 15.0000 mL | Freq: Once | OROMUCOSAL | Status: DC

## 2024-01-15 MED ORDER — SUGAMMADEX SODIUM 200 MG/2ML IV SOLN
INTRAVENOUS | Status: DC | PRN
  Administered 2024-01-15: 200 mg via INTRAVENOUS

## 2024-01-15 MED ORDER — BUPIVACAINE HCL (PF) 0.25 % IJ SOLN
INTRAMUSCULAR | Status: DC | PRN
  Administered 2024-01-15: 40 mL

## 2024-01-15 MED ORDER — SCOPOLAMINE 1 MG/3DAYS TD PT72: 1.0000 | MEDICATED_PATCH | TRANSDERMAL | Status: DC

## 2024-01-15 MED ORDER — PROPOFOL 10 MG/ML IV BOLUS
INTRAVENOUS | Status: DC | PRN
  Administered 2024-01-15: 150 mg via INTRAVENOUS

## 2024-01-15 MED ORDER — ACETAMINOPHEN 10 MG/ML IV SOLN
INTRAVENOUS | Status: AC
  Filled 2024-01-15: qty 100

## 2024-01-15 MED ORDER — ONDANSETRON HCL 4 MG/2ML IJ SOLN: 4.0000 mg | Freq: Once | INTRAMUSCULAR | Status: DC | PRN

## 2024-01-15 MED ORDER — PROPOFOL 10 MG/ML IV BOLUS
INTRAVENOUS | Status: AC
  Filled 2024-01-15: qty 20

## 2024-01-15 MED ORDER — LIDOCAINE HCL (PF) 2 % IJ SOLN
INTRAMUSCULAR | Status: AC
Start: 2024-01-15 — End: ?
  Filled 2024-01-15: qty 5

## 2024-01-15 MED ORDER — CHLORHEXIDINE GLUCONATE 0.12 % MT SOLN
OROMUCOSAL | Status: AC
Start: 2024-01-15 — End: 2024-01-15
  Administered 2024-01-15: 15 mL via OROMUCOSAL
  Filled 2024-01-15: qty 15

## 2024-01-15 MED ORDER — LIDOCAINE HCL (PF) 2 % IJ SOLN
INTRAMUSCULAR | Status: DC | PRN
  Administered 2024-01-15: 100 mg via INTRADERMAL

## 2024-01-15 MED ORDER — HYDROMORPHONE HCL 1 MG/ML IJ SOLN
INTRAMUSCULAR | Status: DC | PRN
  Administered 2024-01-15 (×2): .5 mg via INTRAVENOUS

## 2024-01-15 MED ORDER — HYDROMORPHONE HCL 1 MG/ML IJ SOLN
INTRAMUSCULAR | Status: AC
  Filled 2024-01-15: qty 0.5

## 2024-01-15 MED ORDER — CHLORHEXIDINE GLUCONATE 0.12 % MT SOLN: 15.0000 mL | Freq: Once | OROMUCOSAL | Status: AC

## 2024-01-15 MED ORDER — ONDANSETRON HCL 4 MG/2ML IJ SOLN
INTRAMUSCULAR | Status: DC | PRN
  Administered 2024-01-15: 4 mg via INTRAVENOUS

## 2024-01-15 MED ORDER — ROCURONIUM BROMIDE 10 MG/ML (PF) SYRINGE
PREFILLED_SYRINGE | INTRAVENOUS | Status: AC
  Filled 2024-01-15: qty 10

## 2024-01-15 MED ORDER — SUGAMMADEX SODIUM 200 MG/2ML IV SOLN
INTRAVENOUS | Status: AC
  Filled 2024-01-15: qty 2

## 2024-01-15 MED ORDER — OXYCODONE HCL 5 MG PO TABS: 5.0000 mg | ORAL_TABLET | Freq: Once | ORAL | Status: DC | PRN

## 2024-01-15 MED ORDER — SCOPOLAMINE 1 MG/3DAYS TD PT72
MEDICATED_PATCH | TRANSDERMAL | Status: AC
  Administered 2024-01-15: 1.5 mg via TRANSDERMAL
  Filled 2024-01-15: qty 1

## 2024-01-15 MED ORDER — HEMOSTATIC AGENTS (NO CHARGE) OPTIME
TOPICAL | Status: DC | PRN
  Administered 2024-01-15: 1 via TOPICAL

## 2024-01-15 MED ORDER — ROCURONIUM BROMIDE 10 MG/ML (PF) SYRINGE
PREFILLED_SYRINGE | INTRAVENOUS | Status: DC | PRN
  Administered 2024-01-15: 70 mg via INTRAVENOUS
  Administered 2024-01-15: 30 mg via INTRAVENOUS

## 2024-01-15 MED ORDER — BUPIVACAINE HCL (PF) 0.25 % IJ SOLN
INTRAMUSCULAR | Status: AC
  Filled 2024-01-15: qty 60

## 2024-01-15 MED ORDER — DEXAMETHASONE SODIUM PHOSPHATE 10 MG/ML IJ SOLN
INTRAMUSCULAR | Status: DC | PRN
  Administered 2024-01-15: 10 mg via INTRAVENOUS

## 2024-01-15 MED ORDER — MIDAZOLAM HCL 2 MG/2ML IJ SOLN
INTRAMUSCULAR | Status: DC | PRN
Start: 2024-01-15 — End: 2024-01-15
  Administered 2024-01-15: 2 mg via INTRAVENOUS

## 2024-01-15 MED ORDER — DEXAMETHASONE SODIUM PHOSPHATE 10 MG/ML IJ SOLN
INTRAMUSCULAR | Status: AC
Start: 2024-01-15 — End: ?
  Filled 2024-01-15: qty 1

## 2024-01-15 MED ORDER — CEFAZOLIN SODIUM-DEXTROSE 2-4 GM/100ML-% IV SOLN
2.0000 g | INTRAVENOUS | Status: AC
Start: 2024-01-15 — End: 2024-01-15
  Administered 2024-01-15: 2 g via INTRAVENOUS
  Filled 2024-01-15: qty 100

## 2024-01-15 MED ORDER — DEXMEDETOMIDINE HCL IN NACL 80 MCG/20ML IV SOLN
INTRAVENOUS | Status: AC
  Filled 2024-01-15: qty 20

## 2024-01-15 MED ORDER — GABAPENTIN 300 MG PO CAPS
300.0000 mg | ORAL_CAPSULE | Freq: Three times a day (TID) | ORAL | 0 refills | Status: DC
Start: 2024-01-15 — End: 2024-03-05

## 2024-01-15 MED ORDER — PROPOFOL 500 MG/50ML IV EMUL
INTRAVENOUS | Status: AC
  Filled 2024-01-15: qty 50

## 2024-01-15 MED ORDER — ORAL CARE MOUTH RINSE: 15.0000 mL | Freq: Once | OROMUCOSAL | Status: DC

## 2024-01-15 MED ORDER — OXYCODONE HCL 5 MG PO TABS: 5.0000 mg | ORAL_TABLET | Freq: Four times a day (QID) | ORAL | 0 refills | Status: AC | PRN

## 2024-01-15 MED ORDER — KETOROLAC TROMETHAMINE 30 MG/ML IJ SOLN
INTRAMUSCULAR | Status: AC
  Filled 2024-01-15: qty 1

## 2024-01-15 MED ORDER — ONDANSETRON HCL 4 MG/2ML IJ SOLN
INTRAMUSCULAR | Status: AC
  Filled 2024-01-15: qty 2

## 2024-01-15 MED ORDER — FENTANYL CITRATE (PF) 100 MCG/2ML IJ SOLN
INTRAMUSCULAR | Status: AC
  Filled 2024-01-15: qty 2

## 2024-01-15 MED ORDER — STERILE WATER FOR IRRIGATION IR SOLN
Status: DC | PRN
  Administered 2024-01-15: 1000 mL

## 2024-01-15 MED ORDER — ACETAMINOPHEN 10 MG/ML IV SOLN
INTRAVENOUS | Status: DC | PRN
Start: 2024-01-15 — End: 2024-01-15
  Administered 2024-01-15: 1000 mg via INTRAVENOUS

## 2024-01-15 MED ORDER — MIDAZOLAM HCL 2 MG/2ML IJ SOLN
INTRAMUSCULAR | Status: AC
  Filled 2024-01-15: qty 2

## 2024-01-15 MED ORDER — DOCUSATE SODIUM 100 MG PO CAPS: 100.0000 mg | ORAL_CAPSULE | Freq: Two times a day (BID) | ORAL | 0 refills | Status: AC

## 2024-01-15 MED ORDER — FENTANYL CITRATE PF 50 MCG/ML IJ SOSY: 25.0000 ug | PREFILLED_SYRINGE | INTRAMUSCULAR | Status: DC | PRN

## 2024-01-15 MED ORDER — OXYCODONE HCL 5 MG/5ML PO SOLN: 5.0000 mg | Freq: Once | ORAL | Status: DC | PRN

## 2024-01-15 MED ORDER — LACTATED RINGERS IV SOLN: INTRAVENOUS | Status: DC

## 2024-01-15 MED ORDER — LACTATED RINGERS IV SOLN: INTRAVENOUS | Status: DC | PRN

## 2024-01-15 MED ORDER — POVIDONE-IODINE 10 % EX SWAB: 2.0000 | Freq: Once | CUTANEOUS | Status: DC

## 2024-01-15 MED ORDER — PROPOFOL 500 MG/50ML IV EMUL
INTRAVENOUS | Status: AC
  Filled 2024-01-15: qty 100

## 2024-01-15 MED ORDER — IBUPROFEN 600 MG PO TABS: 600.0000 mg | ORAL_TABLET | Freq: Four times a day (QID) | ORAL | 0 refills | Status: AC | PRN

## 2024-01-15 MED ORDER — KETOROLAC TROMETHAMINE 30 MG/ML IJ SOLN
INTRAMUSCULAR | Status: DC | PRN
  Administered 2024-01-15: 30 mg via INTRAVENOUS

## 2024-01-15 MED ORDER — PROPOFOL 500 MG/50ML IV EMUL
INTRAVENOUS | Status: DC | PRN
  Administered 2024-01-15: 125 ug/kg/min via INTRAVENOUS

## 2024-01-15 SURGICAL SUPPLY — 57 items
BAG URINE DRAIN 2000ML AR STRL (UROLOGICAL SUPPLIES) ×2 IMPLANT
BLADE SURG SZ11 CARB STEEL (BLADE) ×2 IMPLANT
CATH FOLEY 3WAY 30CC 16FR (CATHETERS) ×2 IMPLANT
CAUTERY HOOK MNPLR 1.6 DVNC XI (INSTRUMENTS) ×2 IMPLANT
CHLORAPREP W/TINT 26 (MISCELLANEOUS) ×2 IMPLANT
COVER LIGHT HANDLE STERIS (MISCELLANEOUS) ×4 IMPLANT
COVER MAYO STAND XLG (MISCELLANEOUS) ×2 IMPLANT
DERMABOND ADVANCED .7 DNX12 (GAUZE/BANDAGES/DRESSINGS) ×2 IMPLANT
DILATOR CANAL MILEX (MISCELLANEOUS) IMPLANT
DRAPE ARM DVNC X/XI (DISPOSABLE) ×8 IMPLANT
DRAPE COLUMN DVNC XI (DISPOSABLE) ×2 IMPLANT
DRAPE UTILITY W/TAPE 26X15 (DRAPES) ×4 IMPLANT
DRIVER NDL MEGA 8 DVNC XI (INSTRUMENTS) ×2 IMPLANT
DRIVER NDLE MEGA DVNC XI (INSTRUMENTS) ×2 IMPLANT
ELECT REM PT RETURN 9FT ADLT (ELECTROSURGICAL) ×2 IMPLANT
ELECTRODE REM PT RTRN 9FT ADLT (ELECTROSURGICAL) ×2 IMPLANT
GAUZE 4X4 16PLY ~~LOC~~+RFID DBL (SPONGE) ×4 IMPLANT
GLOVE BIO SURGEON STRL SZ 6.5 (GLOVE) ×6 IMPLANT
GLOVE BIOGEL PI IND STRL 7.0 (GLOVE) ×14 IMPLANT
GLOVE ECLIPSE 6.5 STRL STRAW (GLOVE) IMPLANT
GOWN STRL REUS W/ TWL LRG LVL3 (GOWN DISPOSABLE) ×4 IMPLANT
GOWN STRL REUS W/TWL LRG LVL3 (GOWN DISPOSABLE) ×4 IMPLANT
KIT PINK PAD W/HEAD ARE REST (MISCELLANEOUS) ×2 IMPLANT
KIT PINK PAD W/HEAD ARM REST (MISCELLANEOUS) ×2 IMPLANT
KIT TURNOVER CYSTO (KITS) ×2 IMPLANT
MANIFOLD NEPTUNE II (INSTRUMENTS) ×2 IMPLANT
MANIPULATOR VCARE SML CRV RETR (MISCELLANEOUS) IMPLANT
NDL HYPO 25X1 1.5 SAFETY (NEEDLE) ×2 IMPLANT
NDL INSUFFLATION 14GA 120MM (NEEDLE) IMPLANT
NEEDLE HYPO 25X1 1.5 SAFETY (NEEDLE) ×2 IMPLANT
NEEDLE INSUFFLATION 14GA 120MM (NEEDLE) ×2 IMPLANT
OBTURATOR OPTICAL STND 8 DVNC (TROCAR) ×2 IMPLANT
OBTURATOR OPTICALSTD 8 DVNC (TROCAR) ×2 IMPLANT
PACK PERI GYN (CUSTOM PROCEDURE TRAY) ×2 IMPLANT
POSITIONER HEAD 8X9X4 ADT (SOFTGOODS) ×2 IMPLANT
POWDER SURGICEL 3.0 GRAM (HEMOSTASIS) IMPLANT
SEAL UNIV 5-12 XI (MISCELLANEOUS) ×6 IMPLANT
SEALER VESSEL EXT DVNC XI (MISCELLANEOUS) ×2 IMPLANT
SET BASIN LINEN APH (SET/KITS/TRAYS/PACK) ×2 IMPLANT
SET CYSTO W/LG BORE CLAMP LF (SET/KITS/TRAYS/PACK) IMPLANT
SET TRI-LUMEN FLTR TB AIRSEAL (TUBING) ×2 IMPLANT
SET TUBE IRRIG SUCTION NO TIP (IRRIGATION / IRRIGATOR) IMPLANT
SOL ANTI FOG 6CC (MISCELLANEOUS) ×2 IMPLANT
SPONGE T-LAP 18X18 ~~LOC~~+RFID (SPONGE) ×2 IMPLANT
SUT DVC VLOC 180 0 12IN GS21 (SUTURE) ×2 IMPLANT
SUT MNCRL AB 4-0 PS2 18 (SUTURE) ×2 IMPLANT
SUT VIC AB 0 CT1 27XBRD ANBCTR (SUTURE) ×2 IMPLANT
SUTURE DVC VLC 180 0 12IN GS21 (SUTURE) ×2 IMPLANT
SYR 10ML LL (SYRINGE) ×2 IMPLANT
SYR 20ML LL LF (SYRINGE) ×2 IMPLANT
SYR 50ML LL SCALE MARK (SYRINGE) ×4 IMPLANT
SYR CONTROL 10ML LL (SYRINGE) ×4 IMPLANT
SYR TOOMEY 50ML (SYRINGE) ×2 IMPLANT
TIP ENDOSCOPIC SURGICEL (TIP) IMPLANT
TRAY FOLEY W/BAG SLVR 16FR ST (SET/KITS/TRAYS/PACK) IMPLANT
TROCAR PORT AIRSEAL 8X120 (TROCAR) ×2 IMPLANT
WATER STERILE IRR 500ML POUR (IV SOLUTION) ×2 IMPLANT

## 2024-01-15 NOTE — Transfer of Care (Signed)
 Immediate Anesthesia Transfer of Care Note  Patient: Julia Stanley  Procedure(s) Performed: XI ROBOTIC ASSISTED LAPAROSCOPIC HYSTERECTOMY AND SALPINGECTOMY; REPAIR OF CYSTOTOMY (Bilateral: Abdomen) CYSTOSCOPY (Bladder)  Patient Location: PACU  Anesthesia Type:General  Level of Consciousness: drowsy and patient cooperative  Airway & Oxygen Therapy: Patient Spontanous Breathing and Patient connected to face mask oxygen  Post-op Assessment: Report given to RN and Post -op Vital signs reviewed and stable  Post vital signs: Reviewed and stable  Last Vitals:  Vitals Value Taken Time  BP 111/63 01/15/24 1119  Temp 36.8 C 01/15/24 1119  Pulse 79 01/15/24 1121  Resp 16 01/15/24 1121  SpO2 100 % 01/15/24 1121  Vitals shown include unfiled device data.  Last Pain:  Vitals:   01/15/24 0714  TempSrc: Oral  PainSc: 0-No pain         Complications: No notable events documented.

## 2024-01-15 NOTE — Anesthesia Preprocedure Evaluation (Signed)
 Anesthesia Evaluation  Patient identified by MRN, date of birth, ID band Patient awake    Reviewed: Allergy & Precautions, H&P , NPO status , Patient's Chart, lab work & pertinent test results, reviewed documented beta blocker date and time   History of Anesthesia Complications (+) PONV and history of anesthetic complications  Airway Mallampati: II  TM Distance: >3 FB Neck ROM: full    Dental no notable dental hx.    Pulmonary asthma    Pulmonary exam normal breath sounds clear to auscultation       Cardiovascular Exercise Tolerance: Good hypertension, negative cardio ROS  Rhythm:regular Rate:Normal     Neuro/Psych  Headaches PSYCHIATRIC DISORDERS Anxiety Depression       GI/Hepatic Neg liver ROS,GERD  ,,  Endo/Other  negative endocrine ROS    Renal/GU negative Renal ROS  negative genitourinary   Musculoskeletal   Abdominal   Peds  Hematology negative hematology ROS (+)   Anesthesia Other Findings   Reproductive/Obstetrics negative OB ROS                             Anesthesia Physical Anesthesia Plan  ASA: 2  Anesthesia Plan: General and General ETT   Post-op Pain Management:    Induction:   PONV Risk Score and Plan: Ondansetron  Airway Management Planned:   Additional Equipment:   Intra-op Plan:   Post-operative Plan:   Informed Consent: I have reviewed the patients History and Physical, chart, labs and discussed the procedure including the risks, benefits and alternatives for the proposed anesthesia with the patient or authorized representative who has indicated his/her understanding and acceptance.     Dental Advisory Given  Plan Discussed with: CRNA  Anesthesia Plan Comments:        Anesthesia Quick Evaluation

## 2024-01-15 NOTE — Discharge Instructions (Addendum)
 Post Operative Pain Med Plan:  >Take gabapentin 300 mg three times per day, as prescribed for 4 days, try to space them evenly  >Take Ibuprofen 600mg  every 6 hours as needed for pain  >In between the Ibuprofen, take Tylenol (over the counter) every 6 to 8 hours.  If the Tylenol does not seem strong enough.  Take the tylenol along with the oxycodone every 6 hours If the oxycodone seems "to strong" then just take Tylenol  >Oxycodone will cause constipation, please be sure to take a stool softener (Colace) twice daily while taking this pain medication and/or continue this medication until your bowel regimen returns to normal  If possible try to take buprofen with food to help avoid upsetting your stomach  >Use a heating pad as well as needed  >I have also sent a prescription for zofran (ondansetron) for nausea to take if needed over the first couple of days  >Be gentle with your diet the first few days, liquids and soft non spicy food, fruits are great  >Get up and move, no lifting or straining  HOME INSTRUCTIONS  Please note any unusual or excessive bleeding, pain, swelling. Mild dizziness or drowsiness are normal for about 24 hours after surgery.   Shower when comfortable  Restrictions: No driving for 24 hours or while taking pain medications.  Activity:  No heavy lifting (> 10 lbs), nothing in vagina (no tampons, douching, or intercourse) x 8 weeks; no tub baths for 8 weeks Vaginal spotting is expected but if your bleeding is heavy, period like,  please call the office   Incision: the bandaids will fall off when they are ready to; you may clean your incision with mild soap and water but do not rub or scrub the incision site.  You may experience slight bloody drainage from your incision periodically.  This is normal.  If you experience a large amount of drainage or the incision opens, please call your physician who will likely direct you to the emergency department.  Diet:  You may  return to your regular diet.  Do not eat large meals.  Eat small frequent meals throughout the day.  Continue to drink a good amount of water at least 6-8 glasses of water per day, hydration is very important for the healing process.  Pain Management: Take Ibuprofen every 6 hours with food as needed for pain.  For moderate to severe pain, a prescription of percocet has been sent in.  You can alternate this medication with the ibuprofen.   -If the Percocet is too strong, switch to over the counter tylenol instead.   -Do not take Percocet (oxycodone/acetaminophen) and Tylenol (acetaminophen) together (percocet has tylenol in it).  Always take prescription pain medication with food.  Percocet may cause constipation, you may want to take a stool softener while taking this medication.  A prescription of colace has been sent in to take twice daily if needed while taking the oxycodone.  Be sure to drink plenty of fluids and increase your fiber to help with constipation.  Alcohol -- Avoid for 24 hours and while taking pain medications.  Nausea: Take sips of ginger ale or soda  Fever -- Call physician if temperature over 101 degrees  Follow up:  If you do not already have a follow up appointment scheduled, please call the office at [phone].  If you experience fever (a temperature greater than 100.4), pain unrelieved by pain medication, shortness of breath, swelling of a single leg, or any other  symptoms which are concerning to you please the office immediately.

## 2024-01-15 NOTE — Op Note (Signed)
 Pre Op Dx:   Abnormal uterine bleeding  Post Op Dx:   same  Procedure:   Robotic Assisted Total Laparoscopic Hysterectomy and Bilateral Salpingectomy, Cystoscopy, Repair of cystotomy   Surgeon:  Dr. Myna Hidalgo Assistants:  none Anesthesia:  general   EBL:  25cc  IVF:  1000cc UOP:  150cc   Drains:  Foley catheter Specimen removed:  Uterus and bilateral fallopian tubes Findings:  Normal to small sized uterus with normal ovaries and fallopian tubes bilaterally Complications: Bladder injury  Description of procedure:  After informed consent the patient was taken to the operating room and placed in dorsal supine position where general endotracheal anesthesia was administered and found to be adequate.  She was placed in dorsal lithotomy position with her arms tucked.  She was prepped and draped in the usual sterile fashion.  A timeout was called and the procedure confirmed.  A Vesicare uterine manipulator and a Foley catheter were placed.   An incision was made in the supraumbilical area and the Veress needle was inserted into the abdominal cavity without difficulty. Proper placement was confirmed using the saline drop test and opening pressure was 3 mmHg. A pneumoperitoneum was obtained. The laparoscopic trocar and the laparoscope were placed under direct visualization.  Examination of the abdomen was noted as above.  Uterine perforation was noted.  Attention was turned back to the pelvis.  The manipulator was adjusted to within the uterine cavity with minimal bleeding noted.  Gown and gloves were changed and attention was turned back to the patient's abdomen.  Two additional 7mm ports were placed on either side of the umbilicus and an 8mm port was placed in the left upper quadrant under direct visualization.  The patient was placed in Trendelenburg position and the [employer] robotic device was docked.  Next, attention was turned to the console where the hysterectomy was performed.  Ureters  were visualized bilaterally.  The right fallopian tube was divided at the mesosalpinx and the uteroovarian anastamosis was divided and the right round ligament was divided.  This process was repeated on the contralateral side.  The anterior leaflet of the broad ligament was divided to create a bladder flap. A small anterior and posterior colpotomy incision was made to mark the Koh cup. The uterine artery and vein were skeletonized and desiccated superior to the Koh cup.  This process was repeated on the contralateral side.  The colpotomy was completed along the ridge of the cup and the uterus was passed off the field. The vaginal occluder was placed in the vagina to maintain pneumoperitoneum.  The vaginal cuff was then closed with V-loc suture in a double layer fashion. During the closure of the vaginal cuff, with manipulation of the bladder, a small <1cm cystotomy was noted.  The injury was closed using 2-0 vicryl in a running fashion.  Double layer closure was performed.  The bladder was then filled and water-tight seal noted.  Hemostasis confirmed.  Ureters with peristalsis were visualized bilaterally.   Attention was then turned back to the pelvis.  Cystoscopy was performed.  The repair was noted at the dome of the bladder.  A normal-appearing trigone and bilaterally patent ureteral orifices with normal urine jets noted. Again gown and gloves were changed and attention was turned back to the abdomen.  Excellent hemostasis was noted.  Arista powder was placed on the vaginal cuff and adnexa bilaterally.  The [employer] robotic device was undocked.  Under direct visualization TAP block was completed under  direct visualization using 10cc of 0.25% marcaine in each of four locations.  Airseal was deflated and trocars were removed.The skin was closed with 4-0 Vicryl in subcuticular fashion with skin glue placed atop each port site.    The patient was returned to dorsal supine position, awakened and extubated in  the OR having appeared to tolerate the procedure well.  All sponge, needle, and instrument counts were correct x 2 at the end of the case.  Pt tolerated procedure well and was taken to recovery in stable condition.  Foley to remain in place.  Myna Hidalgo, DO Attending Obstetrician & Gynecologist, [facility] for [employer], [facility]

## 2024-01-15 NOTE — Telephone Encounter (Signed)
 Left message for patient to call me back. Need to reschedule 4/2 appointment to 4/4 per Dr Charlotta Newton.

## 2024-01-15 NOTE — Anesthesia Procedure Notes (Signed)
 Procedure Name: Intubation Date/Time: 01/15/2024 8:39 AM  Performed by: Oletha Cruel, CRNAPre-anesthesia Checklist: Patient identified, Emergency Drugs available, Suction available and Patient being monitored Patient Re-evaluated:Patient Re-evaluated prior to induction Oxygen Delivery Method: Circle system utilized Preoxygenation: Pre-oxygenation with 100% oxygen Induction Type: IV induction Ventilation: Mask ventilation without difficulty Laryngoscope Size: Mac and 4 Grade View: Grade I Tube type: Oral Tube size: 7.0 mm Number of attempts: 1 Airway Equipment and Method: Stylet Placement Confirmation: ETT inserted through vocal cords under direct vision, positive ETCO2, CO2 detector and breath sounds checked- equal and bilateral Secured at: 23 cm Tube secured with: Tape Dental Injury: Teeth and Oropharynx as per pre-operative assessment  Comments: Atraumatic intubation. Lips and teeth remain in preoperative condition.

## 2024-01-16 ENCOUNTER — Telehealth: Payer: Self-pay | Admitting: Obstetrics & Gynecology

## 2024-01-16 ENCOUNTER — Encounter (HOSPITAL_COMMUNITY): Payer: Self-pay | Admitting: Obstetrics & Gynecology

## 2024-01-16 ENCOUNTER — Encounter: Payer: Self-pay | Admitting: Obstetrics & Gynecology

## 2024-01-16 NOTE — Telephone Encounter (Signed)
 Called patient back and reassured her that some discomfort from the Foley catheter is normal -Discussed that catheter will need to remain in place for at least 7 days -Questions and concerns were addressed -Plan to follow-up as scheduled  Myna Hidalgo, DO Attending Obstetrician & Gynecologist, [facility] [employer], [facility]

## 2024-01-16 NOTE — Telephone Encounter (Signed)
 Patient returned call from me that I placed yesterday about moving her appointment to 4/4 per Dr Charlotta Newton. While on the phone, she wanted to know how long she had to wear the catheter. Please advise.

## 2024-01-17 ENCOUNTER — Encounter: Payer: Self-pay | Admitting: Obstetrics & Gynecology

## 2024-01-17 LAB — SURGICAL PATHOLOGY

## 2024-01-18 NOTE — Anesthesia Postprocedure Evaluation (Signed)
 Anesthesia Post Note  Patient: Debora Hazan  Procedure(s) Performed: XI ROBOTIC ASSISTED LAPAROSCOPIC HYSTERECTOMY AND SALPINGECTOMY; REPAIR OF CYSTOTOMY (Bilateral: Abdomen) CYSTOSCOPY (Bladder)  Patient location during evaluation: Phase II Anesthesia Type: General Level of consciousness: awake Pain management: pain level controlled Vital Signs Assessment: post-procedure vital signs reviewed and stable Respiratory status: spontaneous breathing and respiratory function stable Cardiovascular status: blood pressure returned to baseline and stable Postop Assessment: no headache and no apparent nausea or vomiting Anesthetic complications: no Comments: Late entry   No notable events documented.   Last Vitals:  Vitals:   01/15/24 1200 01/15/24 1227  BP: 102/70 (!) 108/53  Pulse: 71 73  Resp: 14 16  Temp:    SpO2: 98% 99%    Last Pain:  Vitals:   01/16/24 1308  TempSrc:   PainSc: 0-No pain                 Windell Norfolk

## 2024-01-19 ENCOUNTER — Encounter: Payer: Self-pay | Admitting: Obstetrics & Gynecology

## 2024-01-21 ENCOUNTER — Other Ambulatory Visit (INDEPENDENT_AMBULATORY_CARE_PROVIDER_SITE_OTHER): Payer: Self-pay | Admitting: Obstetrics & Gynecology

## 2024-01-22 ENCOUNTER — Encounter: Admitting: Obstetrics & Gynecology

## 2024-01-22 ENCOUNTER — Encounter: Payer: Medicare PPO | Admitting: Obstetrics & Gynecology

## 2024-01-23 ENCOUNTER — Ambulatory Visit (INDEPENDENT_AMBULATORY_CARE_PROVIDER_SITE_OTHER): Admitting: Obstetrics & Gynecology

## 2024-01-23 ENCOUNTER — Encounter: Payer: Self-pay | Admitting: Obstetrics & Gynecology

## 2024-01-23 VITALS — BP 119/66 | HR 97

## 2024-01-23 DIAGNOSIS — Z4889 Encounter for other specified surgical aftercare: Secondary | ICD-10-CM

## 2024-01-23 NOTE — Progress Notes (Signed)
    PostOp Visit Note  Julia Stanley is a 38 y.o. G46P0010 female who presents for a postoperative visit. She is 1 week postop following a RAH, BS with bladder repair on 3/26.  Today she notes her biggest issue is the Foley bag.  She has difficulty getting comfortable at night because it is bothering her.  She strongly desires to have her Foley removed.  She notes that her pain is well-controlled. denies nausea or vomiting. Denies fever or chills.  Tolerating gen diet.  +Flatus, Regular BMs.  Overall doing well and reports no acute complaints   Review of Systems Pertinent items are noted in HPI.    Objective:  BP 119/66 (BP Location: Right Arm, Patient Position: Sitting, Cuff Size: Normal)   Pulse 97   LMP  (LMP Unknown)    Physical Examination:  GENERAL ASSESSMENT: well developed and well nourished SKIN: normal color, no lesions CHEST: normal air exchange, respiratory effort normal with no retractions HEART: regular rate and rhythm ABDOMEN: soft, non-distended, no rebound, no guarding INCISION: well healed- C/D/I Foley in place with clear to yellow urine EXTREMITY: no edema, no calf tenderness bilaterally PSYCH: mood appropriate, normal affect       Assessment:    Postop visit   Plan:   -foley removed without difficulty -encouraged regular emptying -pain well controlled -reviewed pelvic rest and continued recovery -f/u in 7wks  Myna Hidalgo, DO Attending Obstetrician & Gynecologist, [facility] [employer], [facility]

## 2024-01-24 ENCOUNTER — Encounter: Payer: Self-pay | Admitting: Obstetrics & Gynecology

## 2024-01-24 ENCOUNTER — Encounter: Admitting: Obstetrics & Gynecology

## 2024-01-28 ENCOUNTER — Encounter: Payer: Self-pay | Admitting: Obstetrics & Gynecology

## 2024-01-29 ENCOUNTER — Other Ambulatory Visit: Payer: Self-pay | Admitting: Obstetrics & Gynecology

## 2024-01-29 MED ORDER — HYDROCORTISONE (PERIANAL) 2.5 % EX CREA: 1.0000 | TOPICAL_CREAM | Freq: Two times a day (BID) | CUTANEOUS | 0 refills | Status: AC

## 2024-01-29 NOTE — Progress Notes (Signed)
 Rx for hemorrhoid cream

## 2024-02-06 ENCOUNTER — Encounter: Payer: Self-pay | Admitting: Obstetrics & Gynecology

## 2024-02-18 ENCOUNTER — Encounter: Payer: Self-pay | Admitting: Obstetrics & Gynecology

## 2024-03-05 ENCOUNTER — Ambulatory Visit: Admitting: Obstetrics & Gynecology

## 2024-03-05 ENCOUNTER — Encounter: Payer: Self-pay | Admitting: Obstetrics & Gynecology

## 2024-03-05 VITALS — BP 109/66 | HR 78 | Ht 65.0 in | Wt 181.2 lb

## 2024-03-05 DIAGNOSIS — Z4889 Encounter for other specified surgical aftercare: Secondary | ICD-10-CM

## 2024-03-05 NOTE — Progress Notes (Signed)
    PostOp Visit Note  Julia Stanley is a 38 y.o. G77P0010 female who presents for a postoperative visit. She is 8 weeks postop following a RAH,BS completed on 3/26.  Complicated by bladder injury- required foley x 1wk.  No issues since.  Voiding without difficulty.  Denies incontinence.  Denies vaginal bleeding, discharge, itching or irritation.  Denies pelvic or abdominal pain.  No   Denies fever or chills.  Tolerating gen diet.  +Flatus, Regular BMs. Overall doing well and reports no acute complaints   Review of Systems Pertinent items are noted in HPI.    Objective:  BP 109/66 (BP Location: Right Arm, Patient Position: Sitting, Cuff Size: Normal)   Pulse 78   Ht 5\' 5"  (1.651 m)   Wt 181 lb 3.2 oz (82.2 kg)   LMP  (LMP Unknown)   BMI 30.15 kg/m    Physical Examination:  GENERAL ASSESSMENT: well developed and well nourished SKIN: normal color, no lesions CHEST: normal air exchange, respiratory effort normal with no retractions HEART: regular rate and rhythm ABDOMEN: soft, non-distended, incisions well healed GU: Normal external and Julia Stanley, vaginal pink moist mucosa, vaginal cuff appears visually intact.  On bimanual exam vaginal cuff intact no defects or abnormalities noted.  No tenderness on exam EXTREMITY: no edema, no calf tenderness bilaterally PSYCH: mood appropriate, normal affect       Assessment:    Postop visit   Plan:   -Meeting milestones appropriately - May return to regular activity - Follow-up as needed reviewed with patient that no further cervical cancer screening indicated  Julia Brickman, DO Attending Obstetrician & Gynecologist, [facility] [employer], [facility]

## 2024-05-14 ENCOUNTER — Other Ambulatory Visit: Payer: Self-pay | Admitting: Otolaryngology

## 2024-05-14 DIAGNOSIS — J324 Chronic pansinusitis: Secondary | ICD-10-CM

## 2024-05-15 ENCOUNTER — Ambulatory Visit
Admission: RE | Admit: 2024-05-15 | Discharge: 2024-05-15 | Disposition: A | Discharge status: Home / Self Care | Source: Ambulatory Visit | Attending: Otolaryngology | Admitting: Otolaryngology

## 2024-05-15 DIAGNOSIS — J324 Chronic pansinusitis: Secondary | ICD-10-CM

## 2024-06-12 ENCOUNTER — Encounter: Payer: Self-pay | Admitting: Radiology

## 2024-07-06 ENCOUNTER — Encounter (HOSPITAL_COMMUNITY): Payer: Self-pay | Admitting: Emergency Medicine

## 2024-07-06 ENCOUNTER — Other Ambulatory Visit: Payer: Self-pay

## 2024-07-06 ENCOUNTER — Emergency Department (HOSPITAL_COMMUNITY)
Admission: EM | Admit: 2024-07-06 | Discharge: 2024-07-06 | Disposition: A | Discharge status: Home / Self Care | Attending: Emergency Medicine | Admitting: Emergency Medicine

## 2024-07-06 ENCOUNTER — Emergency Department (HOSPITAL_COMMUNITY)

## 2024-07-06 DIAGNOSIS — R1084 Generalized abdominal pain: Secondary | ICD-10-CM | POA: Diagnosis not present

## 2024-07-06 DIAGNOSIS — R109 Unspecified abdominal pain: Secondary | ICD-10-CM | POA: Diagnosis present

## 2024-07-06 LAB — URINALYSIS, ROUTINE W REFLEX MICROSCOPIC
Bilirubin Urine: NEGATIVE
Glucose, UA: NEGATIVE mg/dL
Ketones, ur: NEGATIVE mg/dL
Leukocytes,Ua: NEGATIVE
Nitrite: NEGATIVE
Protein, ur: NEGATIVE mg/dL
Specific Gravity, Urine: 1.006 (ref 1.005–1.030)
pH: 6 (ref 5.0–8.0)

## 2024-07-06 LAB — CBC
HCT: 39.8 % (ref 36.0–46.0)
Hemoglobin: 13 g/dL (ref 12.0–15.0)
MCH: 28.5 pg (ref 26.0–34.0)
MCHC: 32.7 g/dL (ref 30.0–36.0)
MCV: 87.3 fL (ref 80.0–100.0)
Platelets: 297 K/uL (ref 150–400)
RBC: 4.56 MIL/uL (ref 3.87–5.11)
RDW: 13.9 % (ref 11.5–15.5)
WBC: 12.8 K/uL — ABNORMAL HIGH (ref 4.0–10.5)
nRBC: 0 % (ref 0.0–0.2)

## 2024-07-06 LAB — LIPASE, BLOOD: Lipase: 28 U/L (ref 11–51)

## 2024-07-06 LAB — COMPREHENSIVE METABOLIC PANEL WITH GFR
ALT: 21 U/L (ref 0–44)
AST: 22 U/L (ref 15–41)
Albumin: 4 g/dL (ref 3.5–5.0)
Alkaline Phosphatase: 93 U/L (ref 38–126)
Anion gap: 14 (ref 5–15)
BUN: 10 mg/dL (ref 6–20)
CO2: 25 mmol/L (ref 22–32)
Calcium: 9.2 mg/dL (ref 8.9–10.3)
Chloride: 98 mmol/L (ref 98–111)
Creatinine, Ser: 0.72 mg/dL (ref 0.44–1.00)
GFR, Estimated: >60 mL/min (ref >60)
Glucose, Bld: 108 mg/dL — ABNORMAL HIGH (ref 70–99)
Potassium: 3.6 mmol/L (ref 3.5–5.1)
Sodium: 137 mmol/L (ref 135–145)
Total Bilirubin: 0.7 mg/dL (ref 0.0–1.2)
Total Protein: 7.3 g/dL (ref 6.5–8.1)

## 2024-07-06 MED ORDER — KETOROLAC TROMETHAMINE 15 MG/ML IJ SOLN
15.0000 mg | Freq: Once | INTRAMUSCULAR | Status: DC
  Filled 2024-07-06: qty 1

## 2024-07-06 MED ORDER — KETOROLAC TROMETHAMINE 15 MG/ML IJ SOLN
15.0000 mg | Freq: Once | INTRAMUSCULAR | Status: AC
  Administered 2024-07-06: 15 mg via INTRAVENOUS

## 2024-07-06 MED ORDER — SODIUM CHLORIDE 0.9 % IV BOLUS
1000.0000 mL | Freq: Once | INTRAVENOUS | Status: AC
  Administered 2024-07-06: 1000 mL via INTRAVENOUS

## 2024-07-06 MED ORDER — ONDANSETRON HCL 4 MG/2ML IJ SOLN
4.0000 mg | Freq: Once | INTRAMUSCULAR | Status: AC
  Administered 2024-07-06: 4 mg via INTRAVENOUS
  Filled 2024-07-06: qty 2

## 2024-07-06 MED ORDER — ONDANSETRON 4 MG PO TBDP: 4.0000 mg | ORAL_TABLET | Freq: Three times a day (TID) | ORAL | 0 refills | Status: AC | PRN

## 2024-07-06 MED ORDER — DICYCLOMINE HCL 20 MG PO TABS: 20.0000 mg | ORAL_TABLET | Freq: Two times a day (BID) | ORAL | 0 refills | Status: AC | PRN

## 2024-07-06 MED ORDER — IOHEXOL 300 MG/ML  SOLN
100.0000 mL | Freq: Once | INTRAMUSCULAR | Status: AC | PRN
  Administered 2024-07-06: 100 mL via INTRAVENOUS

## 2024-07-06 NOTE — ED Provider Notes (Signed)
 [place] EMERGENCY DEPARTMENT AT [facility] Provider Note  CSN: [id number] Arrival date & time: 07/06/24 1324  Chief Complaint(s) Abdominal Pain  HPI Julia Stanley is a 38 y.o. female history of GERD, IBS presenting to the emergency department abdominal pain.  Patient reports abdominal pain for 2 days, worse today.  Reports nausea.  No vomiting.  Reports some chronic diarrhea.  No fevers or chills.  No urinary symptoms.  No vaginal bleeding.  Pain is diffuse.   Past Medical History Past Medical History:  Diagnosis Date   Abnormal Pap smear    Anxiety    Frequent headaches    Nicotine addiction 10/27/2013   Dips since age 64   Polyp of colon    PONV (postoperative nausea and vomiting)    Seasonal allergies    Urinary frequency 08/15/2015   UTI (lower urinary tract infection) 08/15/2015   Vaginal discharge 03/10/2014   Patient Active Problem List   Diagnosis Date Noted   Abnormal uterine bleeding 01/15/2024   Weight gain 11/04/2023   Depo-Provera  contraceptive status 11/04/2023   Sinusitis 01/08/2023   Screening examination for STD (sexually transmitted disease) 10/03/2022   Routine Papanicolaou smear 10/03/2022   Mild intermittent asthma without complication 11/29/2020   Severe tobacco use disorder 11/29/2020   Chronic pain 05/09/2016   Anxiety 05/09/2016   Anxiety and depression 05/09/2016   Urinary frequency 08/15/2015   Lower urinary tract infectious disease 08/15/2015   Hemorrhoids, internal, with bleeding    Frequent headaches 03/01/2015   GERD (gastroesophageal reflux disease) 02/02/2015   IBS (irritable bowel syndrome) 11/30/2014   Vaginal discharge 03/10/2014   Encounter for routine gynecological examination 02/08/2014   Contraception management 02/08/2014   Nicotine addiction 10/27/2013   Home Medication(s) Prior to Admission medications   Medication Sig Start Date End Date Taking? Authorizing Provider  dicyclomine  (BENTYL ) 20 MG tablet Take  1 tablet (20 mg total) by mouth 2 (two) times daily as needed for spasms. 07/06/24  Yes Francesca Elsie CROME, MD  ondansetron  (ZOFRAN -ODT) 4 MG disintegrating tablet Take 1 tablet (4 mg total) by mouth every 8 (eight) hours as needed for nausea or vomiting. 07/06/24  Yes Francesca Elsie CROME, MD  albuterol  (VENTOLIN  HFA) 108 (90 Base) MCG/ACT inhaler Inhale 1-2 puffs into the lungs every 6 (six) hours as needed for wheezing or shortness of breath. 05/27/23   Tobie Arleta SQUIBB, MD  diphenhydrAMINE (BENADRYL) 25 MG tablet Take 50 mg by mouth at bedtime as needed for allergies.    [provider]  famotidine  (PEPCID ) 20 MG tablet Take 1 tablet (20 mg total) by mouth 2 (two) times daily as needed. 05/27/23   Tobie Arleta SQUIBB, MD  fexofenadine (ALLEGRA) 180 MG tablet Take 180 mg by mouth daily. Reported on 05/09/2016    [provider]  hydrocortisone  (PROCTOZONE -HC) 2.5 % rectal cream Place 1 Application rectally 2 (two) times daily. 01/29/24   Ozan, Jennifer, DO  ibuprofen  (ADVIL ) 600 MG tablet Take 1 tablet (600 mg total) by mouth every 6 (six) hours as needed. Patient not taking: Reported on 03/05/2024 01/15/24   Ozan, Jennifer, DO  sertraline (ZOLOFT) 100 MG tablet Take 100 mg by mouth daily. 03/27/23   [provider]  traZODone (DESYREL) 100 MG tablet Take 100 mg by mouth at bedtime.    [provider]  valACYclovir  (VALTREX ) 500 MG tablet Take 500 mg by mouth daily. 01/04/24   [provider]  Past Surgical History Past Surgical History:  Procedure Laterality Date   COLONOSCOPY N/A 04/11/2015   SLF: 1. No source for diarrhea/ abdominal pain identified. 2. One large polyp removed. 3. Large internal hemorrhoids. ADENOMA. Next surveillance 2021   CYSTOSCOPY N/A 01/15/2024   Procedure: CYSTOSCOPY;  Surgeon: Marilynn Nest, DO;  Location: [facility];   Service: Gynecology;  Laterality: N/A;   HEMORRHOID BANDING N/A 04/11/2015   Procedure: HEMORRHOID BANDING;  Surgeon: Margo LITTIE Haddock, MD;  Location: [facility];  Service: Endoscopy;  Laterality: N/A;   None     polyp removed  10/22/2014   ROBOTIC ASSISTED LAPAROSCOPIC HYSTERECTOMY AND SALPINGECTOMY Bilateral 01/15/2024   Procedure: XI ROBOTIC ASSISTED LAPAROSCOPIC HYSTERECTOMY AND SALPINGECTOMY; REPAIR OF CYSTOTOMY;  Surgeon: Ozan, Jennifer, DO;  Location: [facility];  Service: Gynecology;  Laterality: Bilateral;   SKIN CANCER EXCISION  2023   Family History Family History  Problem Relation Age of Onset   Cancer Paternal Grandfather    Hypertension Paternal Grandmother    Asthma Maternal Grandmother    COPD Maternal Grandmother    Dementia Maternal Grandmother    Hypertension Maternal Grandfather    Hyperlipidemia Maternal Grandfather    Diabetes Maternal Grandfather    Cancer Father        bone   Colon cancer Neg Hx     Social History Social History   Tobacco Use   Smoking status: Never   Smokeless tobacco: Current    Types: Snuff   Tobacco comments:    Been dipping since I was 7  Vaping Use   Vaping status: Never Used  Substance Use Topics   Alcohol use: No    Alcohol/week: 0.0 standard drinks of alcohol   Drug use: No   Allergies Aspirin, Sulfa antibiotics, and Nasal spray  Review of Systems Review of Systems  All other systems reviewed and are negative.   Physical Exam Vital Signs  I have reviewed the triage vital signs BP 103/70   Pulse 65   Temp 97.9 F (36.6 C) (Oral)   Resp 20   Ht 5' 5 (1.651 m)   Wt 83 kg   LMP  (LMP Unknown)   SpO2 100%   BMI 30.45 kg/m  Physical Exam Vitals and nursing note reviewed.  Constitutional:      General: She is not in acute distress.    Appearance: She is well-developed.  HENT:     Head: Normocephalic and atraumatic.     Mouth/Throat:     Mouth: Mucous membranes are moist.  Eyes:     Pupils: Pupils are  equal, round, and reactive to light.  Cardiovascular:     Rate and Rhythm: Normal rate and regular rhythm.     Heart sounds: No murmur heard. Pulmonary:     Effort: Pulmonary effort is normal. No respiratory distress.     Breath sounds: Normal breath sounds.  Abdominal:     General: Abdomen is flat.     Palpations: Abdomen is soft.     Tenderness: There is generalized abdominal tenderness. There is no guarding or rebound.  Musculoskeletal:        General: No tenderness.     Right lower leg: No edema.     Left lower leg: No edema.  Skin:    General: Skin is warm and dry.  Neurological:     General: No focal deficit present.     Mental Status: She is alert. Mental status is at baseline.  Psychiatric:  Mood and Affect: Mood normal.        Behavior: Behavior normal.     ED Results and Treatments Labs (all labs ordered are listed, but only abnormal results are displayed) Labs Reviewed  COMPREHENSIVE METABOLIC PANEL WITH GFR - Abnormal; Notable for the following components:      Result Value   Glucose, Bld 108 (*)    All other components within normal limits  CBC - Abnormal; Notable for the following components:   WBC 12.8 (*)    All other components within normal limits  URINALYSIS, ROUTINE W REFLEX MICROSCOPIC - Abnormal; Notable for the following components:   Hgb urine dipstick SMALL (*)    Bacteria, UA RARE (*)    All other components within normal limits  LIPASE, BLOOD                                                                                                                          Radiology CT ABDOMEN PELVIS W CONTRAST Result Date: 07/06/2024 CLINICAL DATA:  Abdominal pain, acute, nonlocalized. EXAM: CT ABDOMEN AND PELVIS WITH CONTRAST TECHNIQUE: Multidetector CT imaging of the abdomen and pelvis was performed using the standard protocol following bolus administration of intravenous contrast. RADIATION DOSE REDUCTION: This exam was performed according to the  departmental dose-optimization program which includes automated exposure control, adjustment of the mA and/or kV according to patient size and/or use of iterative reconstruction technique. CONTRAST:  OMNIPAQUE  IOHEXOL  300 MG/ML  SOLN COMPARISON:  None Available. FINDINGS: Lower chest: Lung bases are clear. Hepatobiliary: Cholelithiasis. Gallbladder is decompressed. Mild intrahepatic biliary dilatation. There are at least 2 subtle hypodensities in the liver that are indeterminate. 8 mm low-density on image 17/2 and 10 mm hypodensity in the right hepatic lobe on image 28/2. Mild heterogeneity throughout the liver. Main portal venous system is patent. Pancreas: Unremarkable. No pancreatic ductal dilatation or surrounding inflammatory changes. Spleen: Normal in size without focal abnormality. Adrenals/Urinary Tract: Normal adrenal glands. 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 No hydronephrosis. Normal appearance of the urinary bladder. No suspicious renal lesions. Stomach/Bowel: Normal appearance of the stomach. Mildly dilated loops of small bowel on the right side of the pelvis containing a small amount of stool like material. Most dilated small bowel loop measures 2.7 cm. Evidence for a retrocecal appendix without acute inflammatory changes. No evidence for bowel obstruction. No focal bowel inflammation. Vascular/Lymphatic: No significant vascular findings are present. No enlarged abdominal or pelvic lymph nodes. Reproductive: Status post hysterectomy. No adnexal masses. Other: Trace free fluid in the pelvis.  Negative for free air. Musculoskeletal: No acute bone abnormality. IMPRESSION: 1. Mildly dilated loops of small bowel on the right side of the pelvis are nonspecific. No evidence for obstruction or acute bowel inflammation. 2. Cholelithiasis. 3. Mild intrahepatic biliary dilatation. Recommend correlation with liver function tests. 4. Two small hypodensities in the  liver are indeterminate. Recommend further characterization with a nonemergent MRI of the abdomen  with and without contrast. 5. Trace free fluid in the pelvis. Electronically Signed   By: Juliene Balder M.D.   On: 07/06/2024 18:38    Pertinent labs & imaging results that were available during my care of the patient were reviewed by me and considered in my medical decision making (see MDM for details).  Medications Ordered in ED Medications  sodium chloride  0.9 % bolus 1,000 mL (1,000 mLs Intravenous New Bag/Given 07/06/24 1720)  ondansetron  (ZOFRAN ) injection 4 mg (4 mg Intravenous Given 07/06/24 1719)  ketorolac  (TORADOL ) 15 MG/ML injection 15 mg (15 mg Intravenous Given 07/06/24 1719)  iohexol  (OMNIPAQUE ) 300 MG/ML solution 100 mL (100 mLs Intravenous Contrast Given 07/06/24 1752)                                                                                                                                     Procedures Procedures  (including critical care time)  Medical Decision Making / ED Course   MDM:  38 year old presenting to the emergency department abdominal pain.  Patient overall well-appearing, physical examination with mild generalized abdominal tenderness.  No rebound or guarding.  No focal tenderness.  Differential includes IBS, GERD, perforation, obstruction, appendicitis, volvulus, diverticulitis, UTI.  Will check labs.  Will obtain CT abdomen pelvis.  Will give pain medication and reassess.    Clinical Course as of 07/06/24 1940  Mon Jul 06, 2024  8062 CT scan without evidence of acute process.  Incidental findings of gallstones and liver lesions discussed with patient including recommendations for follow-up. Will discharge patient to home. All questions answered. Patient comfortable with plan of discharge. Return precautions discussed with patient and specified on the after visit summary.  [WS]    Clinical Course User Index [WS] Francesca Elsie CROME, MD      Additional history obtained:  -External records from outside source obtained and reviewed including: Chart review including previous notes, labs, imaging, consultation notes including prior notes    Lab Tests: -I ordered, reviewed, and interpreted labs.   The pertinent results include:   Labs Reviewed  COMPREHENSIVE METABOLIC PANEL WITH GFR - Abnormal; Notable for the following components:      Result Value   Glucose, Bld 108 (*)    All other components within normal limits  CBC - Abnormal; Notable for the following components:   WBC 12.8 (*)    All other components within normal limits  URINALYSIS, ROUTINE W REFLEX MICROSCOPIC - Abnormal; Notable for the following components:   Hgb urine dipstick SMALL (*)    Bacteria, UA RARE (*)    All other components within normal limits  LIPASE, BLOOD    Notable for nonspecific leukocytosis   Imaging Studies ordered: I ordered imaging studies including CT scan On my interpretation imaging demonstrates no acute process I independently visualized and interpreted imaging. I agree with the radiologist interpretation   Medicines ordered and prescription drug management: Meds ordered  this encounter  Medications   sodium chloride  0.9 % bolus 1,000 mL   ondansetron  (ZOFRAN ) injection 4 mg   DISCONTD: ketorolac  (TORADOL ) 15 MG/ML injection 15 mg   ketorolac  (TORADOL ) 15 MG/ML injection 15 mg   iohexol  (OMNIPAQUE ) 300 MG/ML solution 100 mL   dicyclomine  (BENTYL ) 20 MG tablet    Sig: Take 1 tablet (20 mg total) by mouth 2 (two) times daily as needed for spasms.    Dispense:  20 tablet    Refill:  0   ondansetron  (ZOFRAN -ODT) 4 MG disintegrating tablet    Sig: Take 1 tablet (4 mg total) by mouth every 8 (eight) hours as needed for nausea or vomiting.    Dispense:  20 tablet    Refill:  0    -I have reviewed the patients home medicines and have made adjustments as needed    Social Determinants of Health:  Diagnosis or treatment  significantly limited by social determinants of health: obesity   Reevaluation: After the interventions noted above, I reevaluated the patient and found that their symptoms have improved  Co morbidities that complicate the patient evaluation  Past Medical History:  Diagnosis Date   Abnormal Pap smear    Anxiety    Frequent headaches    Nicotine addiction 10/27/2013   Dips since age 89   Polyp of colon    PONV (postoperative nausea and vomiting)    Seasonal allergies    Urinary frequency 08/15/2015   UTI (lower urinary tract infection) 08/15/2015   Vaginal discharge 03/10/2014      Dispostion: Disposition decision including need for hospitalization was considered, and patient discharged from emergency department.    Final Clinical Impression(s) / ED Diagnoses Final diagnoses:  Generalized abdominal pain     This chart was dictated using voice recognition software.  Despite best efforts to proofread,  errors can occur which can change the documentation meaning.    Francesca Elsie CROME, MD 07/06/24 [phone]

## 2024-07-06 NOTE — ED Notes (Signed)
 [facility] [employer] called for transportation home, ETA 20-30 mins

## 2024-07-06 NOTE — ED Notes (Signed)
 Pt to CT at this time.

## 2024-07-06 NOTE — Discharge Instructions (Addendum)
 We evaluated you for your abdominal pain.  Your testing in the emergency department was reassuring.  We did not find any dangerous cause of your symptoms.  Your CT scan did show gallstones and some spots on your liver.  The radiologist recommends getting an MRI of your liver.  We do not think that these are related to your pain.  Please follow-up with your primary doctor to have this scheduled.  Please follow-up closely with your primary doctor and please return if you have any new or worsening symptoms.

## 2024-07-06 NOTE — ED Triage Notes (Signed)
 Pt bib [facility]. Pt c/o of gernalized abd pain x2 days and c/o of nausea. A few episodes of diarrhea. Pt has hx of IBS.

## 2024-08-24 ENCOUNTER — Encounter: Payer: Self-pay | Admitting: Radiology

## 2024-09-22 ENCOUNTER — Other Ambulatory Visit: Payer: Self-pay | Admitting: Internal Medicine

## 2024-09-22 DIAGNOSIS — K7689 Other specified diseases of liver: Secondary | ICD-10-CM

## 2024-10-01 ENCOUNTER — Ambulatory Visit
Admission: RE | Admit: 2024-10-01 | Discharge: 2024-10-01 | Disposition: A | Discharge status: Home / Self Care | Source: Ambulatory Visit | Attending: Internal Medicine | Admitting: Internal Medicine

## 2024-10-01 DIAGNOSIS — K7689 Other specified diseases of liver: Secondary | ICD-10-CM | POA: Insufficient documentation

## 2024-10-01 MED ORDER — GADOBUTROL 1 MMOL/ML IV SOLN
8.0000 mL | Freq: Once | INTRAVENOUS | Status: AC | PRN
  Administered 2024-10-01: 8 mL via INTRAVENOUS

## 2024-10-31 ENCOUNTER — Emergency Department (HOSPITAL_COMMUNITY)

## 2024-10-31 ENCOUNTER — Other Ambulatory Visit: Payer: Self-pay

## 2024-10-31 ENCOUNTER — Encounter (HOSPITAL_COMMUNITY): Payer: Self-pay

## 2024-10-31 ENCOUNTER — Inpatient Hospital Stay (HOSPITAL_COMMUNITY)
Admission: EM | Admit: 2024-10-31 | Discharge: 2024-11-02 | DRG: 178 | Disposition: A | Discharge status: Home / Self Care | Attending: Hospitalist | Admitting: Hospitalist

## 2024-10-31 DIAGNOSIS — Z789 Other specified health status: Secondary | ICD-10-CM | POA: Diagnosis not present

## 2024-10-31 DIAGNOSIS — Z83438 Family history of other disorder of lipoprotein metabolism and other lipidemia: Secondary | ICD-10-CM

## 2024-10-31 DIAGNOSIS — U071 COVID-19: Secondary | ICD-10-CM | POA: Diagnosis present

## 2024-10-31 DIAGNOSIS — R197 Diarrhea, unspecified: Secondary | ICD-10-CM | POA: Diagnosis present

## 2024-10-31 DIAGNOSIS — R1110 Vomiting, unspecified: Secondary | ICD-10-CM | POA: Insufficient documentation

## 2024-10-31 DIAGNOSIS — K219 Gastro-esophageal reflux disease without esophagitis: Secondary | ICD-10-CM | POA: Diagnosis present

## 2024-10-31 DIAGNOSIS — E861 Hypovolemia: Secondary | ICD-10-CM | POA: Diagnosis present

## 2024-10-31 DIAGNOSIS — Z8249 Family history of ischemic heart disease and other diseases of the circulatory system: Secondary | ICD-10-CM | POA: Diagnosis not present

## 2024-10-31 DIAGNOSIS — Z882 Allergy status to sulfonamides status: Secondary | ICD-10-CM

## 2024-10-31 DIAGNOSIS — Z85828 Personal history of other malignant neoplasm of skin: Secondary | ICD-10-CM | POA: Diagnosis not present

## 2024-10-31 DIAGNOSIS — R109 Unspecified abdominal pain: Secondary | ICD-10-CM

## 2024-10-31 DIAGNOSIS — Z833 Family history of diabetes mellitus: Secondary | ICD-10-CM | POA: Diagnosis not present

## 2024-10-31 DIAGNOSIS — Z79899 Other long term (current) drug therapy: Secondary | ICD-10-CM

## 2024-10-31 DIAGNOSIS — Z720 Tobacco use: Secondary | ICD-10-CM | POA: Diagnosis not present

## 2024-10-31 DIAGNOSIS — K580 Irritable bowel syndrome with diarrhea: Secondary | ICD-10-CM | POA: Diagnosis present

## 2024-10-31 DIAGNOSIS — Z825 Family history of asthma and other chronic lower respiratory diseases: Secondary | ICD-10-CM | POA: Diagnosis not present

## 2024-10-31 DIAGNOSIS — E872 Acidosis, unspecified: Secondary | ICD-10-CM | POA: Diagnosis present

## 2024-10-31 DIAGNOSIS — R112 Nausea with vomiting, unspecified: Principal | ICD-10-CM

## 2024-10-31 DIAGNOSIS — E860 Dehydration: Secondary | ICD-10-CM | POA: Diagnosis present

## 2024-10-31 DIAGNOSIS — E876 Hypokalemia: Secondary | ICD-10-CM | POA: Diagnosis present

## 2024-10-31 DIAGNOSIS — F419 Anxiety disorder, unspecified: Secondary | ICD-10-CM | POA: Diagnosis present

## 2024-10-31 LAB — BASIC METABOLIC PANEL WITH GFR
Anion gap: 18 — ABNORMAL HIGH (ref 5–15)
Anion gap: 15 (ref 5–15)
BUN: 12 mg/dL (ref 6–20)
BUN: 11 mg/dL (ref 6–20)
CO2: 20 mmol/L — ABNORMAL LOW (ref 22–32)
CO2: 21 mmol/L — ABNORMAL LOW (ref 22–32)
Calcium: 8.3 mg/dL — ABNORMAL LOW (ref 8.9–10.3)
Calcium: 8.2 mg/dL — ABNORMAL LOW (ref 8.9–10.3)
Chloride: 100 mmol/L (ref 98–111)
Chloride: 102 mmol/L (ref 98–111)
Creatinine, Ser: 0.81 mg/dL (ref 0.44–1.00)
Creatinine, Ser: 0.77 mg/dL (ref 0.44–1.00)
GFR, Estimated: >60 mL/min
GFR, Estimated: >60 mL/min
Glucose, Bld: 74 mg/dL (ref 70–99)
Glucose, Bld: 94 mg/dL (ref 70–99)
Potassium: 3.3 mmol/L — ABNORMAL LOW (ref 3.5–5.1)
Potassium: 3.3 mmol/L — ABNORMAL LOW (ref 3.5–5.1)
Sodium: 138 mmol/L (ref 135–145)
Sodium: 138 mmol/L (ref 135–145)

## 2024-10-31 LAB — COMPREHENSIVE METABOLIC PANEL WITH GFR
ALT: 16 U/L (ref 0–44)
AST: 25 U/L (ref 15–41)
Albumin: 4.8 g/dL (ref 3.5–5.0)
Alkaline Phosphatase: 82 U/L (ref 38–126)
Anion gap: 23 — ABNORMAL HIGH (ref 5–15)
BUN: 15 mg/dL (ref 6–20)
CO2: 20 mmol/L — ABNORMAL LOW (ref 22–32)
Calcium: 9.3 mg/dL (ref 8.9–10.3)
Chloride: 95 mmol/L — ABNORMAL LOW (ref 98–111)
Creatinine, Ser: 0.9 mg/dL (ref 0.44–1.00)
GFR, Estimated: >60 mL/min
Glucose, Bld: 91 mg/dL (ref 70–99)
Potassium: 3.6 mmol/L (ref 3.5–5.1)
Sodium: 138 mmol/L (ref 135–145)
Total Bilirubin: 0.4 mg/dL (ref 0.0–1.2)
Total Protein: 8 g/dL (ref 6.5–8.1)

## 2024-10-31 LAB — URINALYSIS, ROUTINE W REFLEX MICROSCOPIC
Bilirubin Urine: NEGATIVE
Glucose, UA: NEGATIVE mg/dL
Hgb urine dipstick: NEGATIVE
Ketones, ur: 80 mg/dL — AB
Leukocytes,Ua: NEGATIVE
Nitrite: NEGATIVE
Protein, ur: 30 mg/dL — AB
Specific Gravity, Urine: 1.02 (ref 1.005–1.030)
pH: 5 (ref 5.0–8.0)

## 2024-10-31 LAB — TROPONIN T, HIGH SENSITIVITY
Troponin T High Sensitivity: <15 ng/L (ref 0–19)
Troponin T High Sensitivity: <15 ng/L (ref 0–19)

## 2024-10-31 LAB — CBC WITH DIFFERENTIAL/PLATELET
Abs Immature Granulocytes: 0.03 K/uL (ref 0.00–0.07)
Basophils Absolute: 0 K/uL (ref 0.0–0.1)
Basophils Relative: 0 %
Eosinophils Absolute: 0 K/uL (ref 0.0–0.5)
Eosinophils Relative: 0 %
HCT: 42.3 % (ref 36.0–46.0)
Hemoglobin: 14.1 g/dL (ref 12.0–15.0)
Immature Granulocytes: 0 %
Lymphocytes Relative: 8 %
Lymphs Abs: 0.7 K/uL (ref 0.7–4.0)
MCH: 29.2 pg (ref 26.0–34.0)
MCHC: 33.3 g/dL (ref 30.0–36.0)
MCV: 87.6 fL (ref 80.0–100.0)
Monocytes Absolute: 0.8 K/uL (ref 0.1–1.0)
Monocytes Relative: 10 %
Neutro Abs: 6.5 K/uL (ref 1.7–7.7)
Neutrophils Relative %: 82 %
Platelets: 254 K/uL (ref 150–400)
RBC: 4.83 MIL/uL (ref 3.87–5.11)
RDW: 14.5 % (ref 11.5–15.5)
WBC: 8 K/uL (ref 4.0–10.5)
nRBC: 0 % (ref 0.0–0.2)

## 2024-10-31 LAB — C DIFFICILE QUICK SCREEN W PCR REFLEX
C Diff antigen: NEGATIVE
C Diff interpretation: NOT DETECTED
C Diff toxin: NEGATIVE

## 2024-10-31 LAB — LIPASE, BLOOD: Lipase: 17 U/L (ref 11–51)

## 2024-10-31 MED ORDER — ONDANSETRON HCL 4 MG/2ML IJ SOLN
4.0000 mg | Freq: Once | INTRAMUSCULAR | Status: AC
  Administered 2024-10-31: 4 mg via INTRAVENOUS
  Filled 2024-10-31: qty 2

## 2024-10-31 MED ORDER — DICYCLOMINE HCL 20 MG PO TABS: 20.0000 mg | ORAL_TABLET | Freq: Two times a day (BID) | ORAL | Status: DC | PRN

## 2024-10-31 MED ORDER — ONDANSETRON HCL 4 MG/2ML IJ SOLN
4.0000 mg | Freq: Four times a day (QID) | INTRAMUSCULAR | Status: DC | PRN
  Administered 2024-10-31: 4 mg via INTRAVENOUS
  Filled 2024-10-31: qty 2

## 2024-10-31 MED ORDER — SODIUM CHLORIDE 0.9 % IV BOLUS
1000.0000 mL | Freq: Once | INTRAVENOUS | Status: AC
  Administered 2024-10-31: 1000 mL via INTRAVENOUS

## 2024-10-31 MED ORDER — THIAMINE HCL 100 MG/ML IJ SOLN
100.0000 mg | Freq: Every day | INTRAMUSCULAR | Status: DC
  Administered 2024-10-31 – 2024-11-02 (×3): 100 mg via INTRAVENOUS
  Filled 2024-10-31 (×3): qty 2

## 2024-10-31 MED ORDER — LACTATED RINGERS IV SOLN: INTRAVENOUS | Status: DC

## 2024-10-31 MED ORDER — SERTRALINE HCL 50 MG PO TABS
150.0000 mg | ORAL_TABLET | Freq: Every day | ORAL | Status: DC
  Administered 2024-11-01 – 2024-11-02 (×2): 150 mg via ORAL
  Filled 2024-10-31 (×2): qty 3

## 2024-10-31 MED ORDER — DIPHENHYDRAMINE HCL 50 MG/ML IJ SOLN
25.0000 mg | Freq: Once | INTRAMUSCULAR | Status: AC
  Administered 2024-10-31: 25 mg via INTRAVENOUS
  Filled 2024-10-31: qty 1

## 2024-10-31 MED ORDER — METOCLOPRAMIDE HCL 5 MG/ML IJ SOLN
10.0000 mg | Freq: Four times a day (QID) | INTRAMUSCULAR | Status: DC
  Administered 2024-10-31 – 2024-11-02 (×6): 10 mg via INTRAVENOUS
  Filled 2024-10-31 (×7): qty 2

## 2024-10-31 MED ORDER — FAMOTIDINE 20 MG PO TABS: 20.0000 mg | ORAL_TABLET | Freq: Two times a day (BID) | ORAL | Status: DC | PRN

## 2024-10-31 MED ORDER — GUAIFENESIN-DM 100-10 MG/5ML PO SYRP
5.0000 mL | ORAL_SOLUTION | ORAL | Status: DC | PRN
  Administered 2024-10-31 – 2024-11-01 (×2): 5 mL via ORAL
  Filled 2024-10-31 (×2): qty 5

## 2024-10-31 MED ORDER — TRAZODONE HCL 50 MG PO TABS
100.0000 mg | ORAL_TABLET | Freq: Every day | ORAL | Status: DC
  Administered 2024-10-31 – 2024-11-01 (×2): 100 mg via ORAL
  Filled 2024-10-31 (×2): qty 2

## 2024-10-31 MED ORDER — DEXTROSE IN LACTATED RINGERS 5 % IV SOLN: INTRAVENOUS | Status: DC

## 2024-10-31 MED ORDER — METOCLOPRAMIDE HCL 5 MG/ML IJ SOLN
10.0000 mg | Freq: Once | INTRAMUSCULAR | Status: AC
  Administered 2024-10-31: 10 mg via INTRAVENOUS
  Filled 2024-10-31: qty 2

## 2024-10-31 MED ORDER — IOHEXOL 350 MG/ML SOLN
100.0000 mL | Freq: Once | INTRAVENOUS | Status: AC | PRN
  Administered 2024-10-31: 100 mL via INTRAVENOUS

## 2024-10-31 MED ORDER — ENOXAPARIN SODIUM 40 MG/0.4ML IJ SOSY
40.0000 mg | PREFILLED_SYRINGE | Freq: Every day | INTRAMUSCULAR | Status: DC
  Administered 2024-10-31 – 2024-11-01 (×2): 40 mg via SUBCUTANEOUS
  Filled 2024-10-31 (×2): qty 0.4

## 2024-10-31 MED ORDER — DICYCLOMINE HCL 10 MG/ML IM SOLN
20.0000 mg | Freq: Once | INTRAMUSCULAR | Status: AC
  Administered 2024-10-31: 20 mg via INTRAMUSCULAR
  Filled 2024-10-31: qty 2

## 2024-10-31 MED ORDER — ONDANSETRON HCL 4 MG PO TABS: 4.0000 mg | ORAL_TABLET | Freq: Four times a day (QID) | ORAL | Status: DC | PRN

## 2024-10-31 NOTE — Plan of Care (Signed)
   Problem: Education: Goal: Knowledge of General Education information will improve Description: Including pain rating scale, medication(s)/side effects and non-pharmacologic comfort measures Outcome: Progressing   Problem: Health Behavior/Discharge Planning: Goal: Ability to manage health-related needs will improve Outcome: Progressing   Problem: Clinical Measurements: Goal: Will remain free from infection Outcome: Progressing   Problem: Activity: Goal: Risk for activity intolerance will decrease Outcome: Progressing   Problem: Nutrition: Goal: Adequate nutrition will be maintained Outcome: Progressing   Problem: Coping: Goal: Level of anxiety will decrease Outcome: Progressing

## 2024-10-31 NOTE — ED Triage Notes (Signed)
 Patient arrives from home via [facility] c/c chest pain she feels is related to her vomiting 2/2 covid infection. Positive test 4 days ago. States her CP feels like someone is pulling her chest out through her throat.

## 2024-10-31 NOTE — H&P (Signed)
 " History and Physical    Patient: Julia Stanley FMW:[id number] DOB: [birth year] DOA: 10/31/2024 DOS: the patient was seen and examined on 10/31/2024 PCP: Fernande Ophelia JINNY DOUGLAS, MD  Patient coming from: Home  Chief Complaint:  Chief Complaint  Patient presents with   Chest Pain   HPI: Julia Stanley is a 39 y.o. female with medical history significant of IBS.  Patient presents with recent positive COVID test 4 days ago.  She went to urgent care due to nausea, vomiting, diarrhea that started earlier that day.  Due to how ill she felt, she went for evaluation.  Due to the positive COVID test, she was given antiviral treatment for the COVID test.  This does not seem to have helped.  She has been intolerant of oral food and liquids over the last 4 days.  Due to worsening symptoms, she came to the hospital for evaluation.  She does have a history of C. difficile a few months ago.  She did have recent antibiotics: She had a upper respiratory infection and thinks she received Augmentin  or amoxicillin .  Here in the ER, she received Zofran , Reglan , Benadryl , IV fluids. Denies fevers, chills.  She does have mild abdominal pain throughout.  Review of Systems: As mentioned in the history of present illness. All other systems reviewed and are negative. Past Medical History:  Diagnosis Date   Abnormal Pap smear    Anxiety    Frequent headaches    Nicotine addiction 10/27/2013   Dips since age 89   Polyp of colon    PONV (postoperative nausea and vomiting)    Seasonal allergies    Urinary frequency 08/15/2015   UTI (lower urinary tract infection) 08/15/2015   Vaginal discharge 03/10/2014   Past Surgical History:  Procedure Laterality Date   COLONOSCOPY N/A 04/11/2015   SLF: 1. No source for diarrhea/ abdominal pain identified. 2. One large polyp removed. 3. Large internal hemorrhoids. ADENOMA. Next surveillance 2021   CYSTOSCOPY N/A 01/15/2024   Procedure: CYSTOSCOPY;  Surgeon: Marilynn Nest, DO;   Location: [facility];  Service: Gynecology;  Laterality: N/A;   HEMORRHOID BANDING N/A 04/11/2015   Procedure: HEMORRHOID BANDING;  Surgeon: Margo LITTIE Haddock, MD;  Location: [facility];  Service: Endoscopy;  Laterality: N/A;   None     polyp removed  10/22/2014   ROBOTIC ASSISTED LAPAROSCOPIC HYSTERECTOMY AND SALPINGECTOMY Bilateral 01/15/2024   Procedure: XI ROBOTIC ASSISTED LAPAROSCOPIC HYSTERECTOMY AND SALPINGECTOMY; REPAIR OF CYSTOTOMY;  Surgeon: Ozan, Jennifer, DO;  Location: [facility];  Service: Gynecology;  Laterality: Bilateral;   SKIN CANCER EXCISION  2023   Social History:  reports that she has never smoked. Her smokeless tobacco use includes chew. She reports that she does not drink alcohol and does not use drugs.  Allergies[1]  Family History  Problem Relation Age of Onset   Cancer Paternal Grandfather    Hypertension Paternal Grandmother    Asthma Maternal Grandmother    COPD Maternal Grandmother    Dementia Maternal Grandmother    Hypertension Maternal Grandfather    Hyperlipidemia Maternal Grandfather    Diabetes Maternal Grandfather    Cancer Father        bone   Colon cancer Neg Hx     Prior to Admission medications  Medication Sig Start Date End Date Taking? Authorizing Provider  albuterol  (VENTOLIN  HFA) 108 (90 Base) MCG/ACT inhaler Inhale 1-2 puffs into the lungs every 6 (six) hours as needed for wheezing or shortness of breath. 05/27/23   Tobie,  Arleta SQUIBB, MD  dicyclomine  (BENTYL ) 20 MG tablet Take 1 tablet (20 mg total) by mouth 2 (two) times daily as needed for spasms. 07/06/24   Francesca Elsie CROME, MD  diphenhydrAMINE  (BENADRYL ) 25 MG tablet Take 50 mg by mouth at bedtime as needed for allergies.    [provider]  famotidine  (PEPCID ) 20 MG tablet Take 1 tablet (20 mg total) by mouth 2 (two) times daily as needed. 05/27/23   Tobie Arleta SQUIBB, MD  fexofenadine (ALLEGRA) 180 MG tablet Take 180 mg by mouth daily. Reported on 05/09/2016    [provider]   hydrocortisone  (PROCTOZONE -HC) 2.5 % rectal cream Place 1 Application rectally 2 (two) times daily. 01/29/24   Ozan, Jennifer, DO  ibuprofen  (ADVIL ) 600 MG tablet Take 1 tablet (600 mg total) by mouth every 6 (six) hours as needed. Patient not taking: Reported on 03/05/2024 01/15/24   Ozan, Jennifer, DO  ondansetron  (ZOFRAN -ODT) 4 MG disintegrating tablet Take 1 tablet (4 mg total) by mouth every 8 (eight) hours as needed for nausea or vomiting. 07/06/24   Francesca Elsie CROME, MD  sertraline  (ZOLOFT ) 100 MG tablet Take 100 mg by mouth daily. 03/27/23   [provider]  traZODone  (DESYREL ) 100 MG tablet Take 100 mg by mouth at bedtime.    [provider]  valACYclovir  (VALTREX ) 500 MG tablet Take 500 mg by mouth daily. 01/04/24   [provider]    Physical Exam: Vitals:   10/31/24 1300 10/31/24 1400 10/31/24 1657 10/31/24 1700  BP: 103/75 (!) 115/102  (!) 91/56  Pulse: (!) 108 94    Resp: 18 18    Temp:   98 F (36.7 C)   TempSrc:   Oral   SpO2: 95% 97%    Weight:      Height:       General: Young female. Awake and alert and oriented x3. No acute cardiopulmonary distress.  HEENT: Normocephalic atraumatic.  Right and left ears normal in appearance.  Pupils equal, round, reactive to light. Extraocular muscles are intact. Sclerae anicteric and noninjected.  Moist mucosal membranes. No mucosal lesions.  Neck: Neck supple without lymphadenopathy. No carotid bruits. No masses palpated.  Cardiovascular: Regular rate with normal S1-S2 sounds. No murmurs, rubs, gallops auscultated. No JVD.  Respiratory: Good respiratory effort with no wheezes, rales, rhonchi. Lungs clear to auscultation bilaterally.  No accessory muscle use. Abdomen: Soft, mildly tender throughout, nondistended. Active bowel sounds. No masses or hepatosplenomegaly  Skin: No rashes, lesions, or ulcerations.  Dry, warm to touch. 2+ dorsalis pedis and radial pulses. Musculoskeletal: No calf or leg pain. All  major joints not erythematous nontender.  No upper or lower joint deformation.  Good ROM.  No contractures  Psychiatric: Intact judgment and insight. Pleasant and cooperative. Neurologic: No focal neurological deficits. Strength is 5/5 and symmetric in upper and lower extremities.  Cranial nerves II through XII are grossly intact.  Data Reviewed: Labs and imaging reviewed by me  Assessment and Plan: No notes have been filed under this hospital service. Service: Hospitalist  Principal Problem:   Metabolic acidosis Active Problems:   GERD (gastroesophageal reflux disease)   Diarrhea   Intractable vomiting   COVID-19 virus infection  Metabolic acidosis IV fluids with D5 LR Repeat BMP every 4 hours overnight Intractable vomiting Zofran  as needed Reglan  IV scheduled Fluid replacement Diarrhea C. difficile testing sent.  If testing is negative, can order antidiarrheals Enteric precautions COVID-19 virus Droplet precautions GERD IBS   Advance Care  Planning:   Code Status: Full Code confirmed by patient  Consults: None  Family Communication: None  Severity of Illness: The appropriate patient status for this patient is OBSERVATION. Observation status is judged to be reasonable and necessary in order to provide the required intensity of service to ensure the patient's safety. The patient's presenting symptoms, physical exam findings, and initial radiographic and laboratory data in the context of their medical condition is felt to place them at decreased risk for further clinical deterioration. Furthermore, it is anticipated that the patient will be medically stable for discharge from the hospital within 2 midnights of admission.   Author: Sharlot Sturkey J Saidi Santacroce, DO 10/31/2024 5:31 PM  For on call review [email or web address].     [1]  Allergies Allergen Reactions   Aspirin Other (See Comments)    Nose bleeds.   Sulfa Antibiotics Hives    Childhood allergy    Nasal Spray Other (See  Comments)    Nose bleeds   "

## 2024-10-31 NOTE — ED Provider Notes (Signed)
 " [place] EMERGENCY DEPARTMENT AT [facility] Provider Note   CSN: [id number] Arrival date & time: 10/31/24  1242     Patient presents with: Chest Pain   Julia Stanley is a 39 y.o. female.   Patient is a 39 year old female who presents to the emergency department the chief complaint of chest pain, shortness of breath, abdominal pain, nausea, vomiting, diarrhea which has been ongoing for approximate the past 5 days.  Patient notes that she has had poor p.o. intake.  She does note that she had a recent COVID-19 diagnosis and was placed on antivirals for this.  Patient notes that she has also had a previous diagnosis of C. difficile.  She notes that there has been no noted fever or chills at home.  She denies any associated dysuria or hematuria.   Chest Pain Associated symptoms: abdominal pain, nausea and vomiting        Prior to Admission medications  Medication Sig Start Date End Date Taking? Authorizing Provider  albuterol  (VENTOLIN  HFA) 108 (90 Base) MCG/ACT inhaler Inhale 1-2 puffs into the lungs every 6 (six) hours as needed for wheezing or shortness of breath. 05/27/23   Tobie Arleta SQUIBB, MD  dicyclomine  (BENTYL ) 20 MG tablet Take 1 tablet (20 mg total) by mouth 2 (two) times daily as needed for spasms. 07/06/24   Francesca Elsie CROME, MD  diphenhydrAMINE  (BENADRYL ) 25 MG tablet Take 50 mg by mouth at bedtime as needed for allergies.    [provider]  famotidine  (PEPCID ) 20 MG tablet Take 1 tablet (20 mg total) by mouth 2 (two) times daily as needed. 05/27/23   Tobie Arleta SQUIBB, MD  fexofenadine (ALLEGRA) 180 MG tablet Take 180 mg by mouth daily. Reported on 05/09/2016    [provider]  hydrocortisone  (PROCTOZONE -HC) 2.5 % rectal cream Place 1 Application rectally 2 (two) times daily. 01/29/24   Ozan, Jennifer, DO  ibuprofen  (ADVIL ) 600 MG tablet Take 1 tablet (600 mg total) by mouth every 6 (six) hours as needed. Patient not taking: Reported on 03/05/2024  01/15/24   Ozan, Jennifer, DO  ondansetron  (ZOFRAN -ODT) 4 MG disintegrating tablet Take 1 tablet (4 mg total) by mouth every 8 (eight) hours as needed for nausea or vomiting. 07/06/24   Francesca Elsie CROME, MD  sertraline  (ZOLOFT ) 100 MG tablet Take 100 mg by mouth daily. 03/27/23   [provider]  traZODone  (DESYREL ) 100 MG tablet Take 100 mg by mouth at bedtime.    [provider]  valACYclovir  (VALTREX ) 500 MG tablet Take 500 mg by mouth daily. 01/04/24   [provider]    Allergies: Aspirin, Sulfa antibiotics, and Nasal spray    Review of Systems  Cardiovascular:  Positive for chest pain.  Gastrointestinal:  Positive for abdominal pain, diarrhea, nausea and vomiting.  All other systems reviewed and are negative.   Updated Vital Signs BP 104/82 (BP Location: Left Arm)   Pulse (!) 120   Temp 99.1 F (37.3 C) (Oral)   Resp 18   Ht 5' 5 (1.651 m)   Wt 73.2 kg   LMP  (LMP Unknown)   SpO2 95%   BMI 26.85 kg/m   Physical Exam Vitals and nursing note reviewed.  Constitutional:      General: She is not in acute distress.    Appearance: Normal appearance. She is not ill-appearing.  HENT:     Head: Normocephalic and atraumatic.     Nose: Nose normal.     Mouth/Throat:  Mouth: Mucous membranes are moist.  Eyes:     Extraocular Movements: Extraocular movements intact.     Conjunctiva/sclera: Conjunctivae normal.     Pupils: Pupils are equal, round, and reactive to light.  Cardiovascular:     Rate and Rhythm: Normal rate and regular rhythm.     Pulses: Normal pulses.     Heart sounds: Normal heart sounds. Heart sounds not distant. No murmur heard. Pulmonary:     Effort: Pulmonary effort is normal. No tachypnea.     Breath sounds: Normal breath sounds. No decreased breath sounds, wheezing, rhonchi or rales.  Chest:     Chest wall: No tenderness.  Abdominal:     General: Abdomen is flat. Bowel sounds are normal.     Palpations: Abdomen is soft.      Tenderness: There is abdominal tenderness. There is no guarding.  Musculoskeletal:        General: Normal range of motion.     Cervical back: Normal range of motion and neck supple.     Right lower leg: No edema.     Left lower leg: No edema.  Skin:    General: Skin is warm and dry.     Findings: No ecchymosis or rash.  Neurological:     General: No focal deficit present.     Mental Status: She is alert and oriented to person, place, and time. Mental status is at baseline.  Psychiatric:        Mood and Affect: Mood normal.        Behavior: Behavior normal.        Thought Content: Thought content normal.        Judgment: Judgment normal.     (all labs ordered are listed, but only abnormal results are displayed) Labs Reviewed  GASTROINTESTINAL PANEL BY PCR, STOOL (REPLACES STOOL CULTURE)  C DIFFICILE QUICK SCREEN W PCR REFLEX    COMPREHENSIVE METABOLIC PANEL WITH GFR  LIPASE, BLOOD  CBC WITH DIFFERENTIAL/PLATELET  URINALYSIS, ROUTINE W REFLEX MICROSCOPIC  TROPONIN T, HIGH SENSITIVITY    EKG: EKG Interpretation Date/Time:  Saturday October 31 2024 12:58:48 EST Ventricular Rate:  108 PR Interval:  128 QRS Duration:  81 QT Interval:  353 QTC Calculation: 474 R Axis:   9  Text Interpretation: Sinus tachycardia Borderline abnrm T, anterolateral leads No old tracing to compare Confirmed by Dean Clarity [phone]) on 10/31/2024 1:20:09 PM  Radiology: No results found.   Procedures   Medications Ordered in the ED  dicyclomine  (BENTYL ) injection 20 mg (has no administration in time range)  ondansetron  (ZOFRAN ) injection 4 mg (has no administration in time range)  sodium chloride  0.9 % bolus 1,000 mL (has no administration in time range)                                    Medical Decision Making Amount and/or Complexity of Data Reviewed Labs: ordered. Radiology: ordered.  Risk Prescription drug management. Decision regarding hospitalization.   This patient  presents to the ED for concern of abdominal pain, nausea, vomiting, chest pain, diarrhea differential diagnosis includes ACS, pulmonary embolus, pericarditis, myocarditis, endocarditis, aortic intervention or dissection, pneumonia, pneumothorax, hemothorax, appendicitis, cholecystitis, small bowel obstruction, diverticulitis, ovarian torsion or cyst, PID, tubo-ovarian abscess, pyelonephritis, kidney stone, gastroenteritis, C. difficile, colitis    Additional history obtained:  Additional history obtained from medical records External records from outside source obtained and reviewed including medical  records   Lab Tests:  I Ordered, and personally interpreted labs.  The pertinent results include: No leukocytosis, no anemia, normal kidney function liver function, acidosis noted, unremarkable urinalysis, normal lipase, negative troponin, negative C. difficile   Imaging Studies ordered:  I ordered imaging studies including CT scan of abdomen and pelvis, CTA chest I independently visualized and interpreted imaging which showed findings consistent with diarrheal illness, no pulmonary embolus I agree with the radiologist interpretation   Medicines ordered and prescription drug management:  I ordered medication including Reglan , Benadryl , IV fluids, Bentyl , Zofran  for abdominal pain, acidosis, dehydration Reevaluation of the patient after these medicines showed that the patient improved I have reviewed the patients home medicines and have made adjustments as needed   Problem List / ED Course:  Patient does remain stable at this time.  Patient continues to have ongoing nausea and diarrhea in the emergency department.  She is acidotic and do suspect that this is secondary to dehydration.  Will plan for admission to the hospitalist service for further IV hydration and workup.  Blood work has been overall unremarkable at this point.  She is negative for C. difficile.  CT scan of abdomen and  pelvis is consistent with diarrheal illness.  She does have a recent diagnosis of COVID-19.  Have discussed patient case with Dr. Barbra with the hospital service who has excepted for admission.  CT of the chest demonstrated no indication for pulmonary embolus.  CT abdomen and pelvis demonstrated cholelithiasis without acute cholecystitis.   Social Determinants of Health:  None        Final diagnoses:  None    ED Discharge Orders     None          Daralene Lonni BIRCH, [place] 10/31/24 1846  "

## 2024-11-01 LAB — BASIC METABOLIC PANEL WITH GFR
Anion gap: 10 (ref 5–15)
Anion gap: 8 (ref 5–15)
BUN: 9 mg/dL (ref 6–20)
BUN: 9 mg/dL (ref 6–20)
CO2: 27 mmol/L (ref 22–32)
CO2: 29 mmol/L (ref 22–32)
Calcium: 8.2 mg/dL — ABNORMAL LOW (ref 8.9–10.3)
Calcium: 8.2 mg/dL — ABNORMAL LOW (ref 8.9–10.3)
Chloride: 102 mmol/L (ref 98–111)
Chloride: 103 mmol/L (ref 98–111)
Creatinine, Ser: 0.76 mg/dL (ref 0.44–1.00)
Creatinine, Ser: 0.71 mg/dL (ref 0.44–1.00)
GFR, Estimated: >60 mL/min
GFR, Estimated: >60 mL/min
Glucose, Bld: 114 mg/dL — ABNORMAL HIGH (ref 70–99)
Glucose, Bld: 105 mg/dL — ABNORMAL HIGH (ref 70–99)
Potassium: 3.2 mmol/L — ABNORMAL LOW (ref 3.5–5.1)
Potassium: 3.2 mmol/L — ABNORMAL LOW (ref 3.5–5.1)
Sodium: 139 mmol/L (ref 135–145)
Sodium: 140 mmol/L (ref 135–145)

## 2024-11-01 LAB — CBC
HCT: 35 % — ABNORMAL LOW (ref 36.0–46.0)
Hemoglobin: 11.8 g/dL — ABNORMAL LOW (ref 12.0–15.0)
MCH: 29.4 pg (ref 26.0–34.0)
MCHC: 33.7 g/dL (ref 30.0–36.0)
MCV: 87.1 fL (ref 80.0–100.0)
Platelets: 205 K/uL (ref 150–400)
RBC: 4.02 MIL/uL (ref 3.87–5.11)
RDW: 14.6 % (ref 11.5–15.5)
WBC: 5.5 K/uL (ref 4.0–10.5)
nRBC: 0 % (ref 0.0–0.2)

## 2024-11-01 LAB — HIV ANTIBODY (ROUTINE TESTING W REFLEX): HIV Screen 4th Generation wRfx: NONREACTIVE

## 2024-11-01 MED ORDER — POTASSIUM CHLORIDE 10 MEQ/100ML IV SOLN
10.0000 meq | INTRAVENOUS | Status: AC
  Administered 2024-11-01 (×4): 10 meq via INTRAVENOUS
  Filled 2024-11-01 (×4): qty 100

## 2024-11-01 MED ORDER — SODIUM CHLORIDE 0.9 % IV BOLUS
1000.0000 mL | Freq: Once | INTRAVENOUS | Status: AC
  Administered 2024-11-01: 1000 mL via INTRAVENOUS

## 2024-11-01 MED ORDER — ATOGEPANT 60 MG PO TABS: 1.0000 | ORAL_TABLET | Freq: Every day | ORAL | Status: DC

## 2024-11-01 MED ORDER — POTASSIUM CHLORIDE 20 MEQ PO PACK
20.0000 meq | PACK | Freq: Two times a day (BID) | ORAL | Status: AC
  Administered 2024-11-01 (×2): 20 meq via ORAL
  Filled 2024-11-01 (×2): qty 1

## 2024-11-01 MED ORDER — VALACYCLOVIR HCL 500 MG PO TABS
500.0000 mg | ORAL_TABLET | Freq: Every day | ORAL | Status: DC
  Administered 2024-11-01 – 2024-11-02 (×2): 500 mg via ORAL
  Filled 2024-11-01 (×2): qty 1

## 2024-11-01 MED ORDER — LACTATED RINGERS IV SOLN: INTRAVENOUS | Status: AC

## 2024-11-01 MED ORDER — LOPERAMIDE HCL 2 MG PO CAPS
2.0000 mg | ORAL_CAPSULE | ORAL | Status: DC | PRN
  Administered 2024-11-01 (×2): 2 mg via ORAL
  Filled 2024-11-01 (×2): qty 1

## 2024-11-01 NOTE — Progress Notes (Signed)
 Patient aware that needs to have someone bring her medication Atogepant  (Qulipta ) in from home as [facility] pharmacy does not carry this medication

## 2024-11-01 NOTE — Progress Notes (Signed)
 " PROGRESS NOTE    Julia Stanley  FMW:[id number] DOB: June 01, 1986 DOA: 10/31/2024 PCP: Fernande Ophelia JINNY DOUGLAS, MD   Brief Narrative:   39 year old female with history of GERD, IBS, recent COVID infection tested about 4 days ago comes in with persistent diarrhea, nausea and vomiting.  She states she went to urgent care and received antiviral for COVID but could not put it down.  She continued to feel worse and has not been able to keep fluids down.  She has also been having persistent diarrhea.  In the ER she received Zofran , Reglan , Benadryl  and IV fluid boluses.  Patient has no fever or chills, has mild abdominal discomfort.  Workup overall negative with negative CT angiogram of chest.  CT abdomen and pelvis shows diarrheal state otherwise no other significant findings.    Labs with hypokalemia, metabolic acidosis.    Patient admitted for further management.   Assessment & Plan:   Principal Problem:   Metabolic acidosis Active Problems:   GERD (gastroesophageal reflux disease)   Diarrhea   Intractable vomiting   COVID-19 virus infection   Intractable nausea vomiting diarrhea, metabolic acidosis: History of IBS, symptoms triggered by COVID-19.  CT shows diarrheal state. Will continue symptomatic management. Continue IV fluid, switch to LR at 100 cc/h Replace potassium IV/p.o. Will advance diet today.  Diarrhea: C. difficile testing negative, stool GI panel is pending Will start Imodium   COVID-19: Continue droplet precautions, patient is not hypoxic, CT chest is negative.  No need for COVID directed therapies or steroids.  Hypotension: Likely secondary to hypovolemia.  Will monitor closely, continue IV fluids.  GERD  IBS    Status is: Inpatient Remains inpatient appropriate because: Continued symptoms, continue need for IV medications, hypotension    Consultants:   Procedures:   Antimicrobials:    Subjective:  Patient seen and examined at the bedside.  She  continues to feel nauseous, want to advance diet.  Continues to have loose stools. Objective: Vitals:   11/01/24 0200 11/01/24 0524 11/01/24 0820 11/01/24 0825  BP: 95/68 95/61 (!) 87/50 (!) 87/55  Pulse: 63 (!) 59 (!) 57 (!) 56  Resp: 14 14  18   Temp: 99 F (37.2 C) 98.7 F (37.1 C)  97.8 F (36.6 C)  TempSrc: Oral Oral  Oral  SpO2: 97% 96%  96%  Weight:      Height:        Intake/Output Summary (Last 24 hours) at 11/01/2024 0958 Last data filed at 11/01/2024 0941 Gross per 24 hour  Intake 2252.81 ml  Output --  Net 2252.81 ml   Filed Weights   10/31/24 1251  Weight: 73.2 kg    Examination:  General exam: Appears calm and comfortable  Respiratory system: Bilateral decreased breath sounds at bases Cardiovascular system: S1 & S2 heard, Rate controlled Gastrointestinal system: Abdomen is nondistended, soft and nontender. Normal bowel sounds heard. Extremities: No cyanosis, clubbing, edema  Central nervous system: Alert and oriented. No focal neurological deficits. Moving extremities Skin: No rashes, lesions or ulcers Psychiatry: Judgement and insight appear normal. Mood & affect appropriate.     Data Reviewed: I have personally reviewed following labs and imaging studies  CBC: Recent Labs  Lab 10/31/24 1316 11/01/24 0239  WBC 8.0 5.5  NEUTROABS 6.5  --   HGB 14.1 11.8*  HCT 42.3 35.0*  MCV 87.6 87.1  PLT 254 205   Basic Metabolic Panel: Recent Labs  Lab 10/31/24 1316 10/31/24 1819 10/31/24 2201 11/01/24 0239 11/01/24 9390  NA 138 138 138 139 140  K 3.6 3.3* 3.3* 3.2* 3.2*  CL 95* 100 102 102 103  CO2 20* 20* 21* 27 29  GLUCOSE 91 74 94 114* 105*  BUN 15 12 11 9 9   CREATININE 0.90 0.81 0.77 0.76 0.71  CALCIUM 9.3 8.3* 8.2* 8.2* 8.2*   GFR: Estimated Creatinine Clearance: 95.6 mL/min (by C-G formula based on SCr of 0.71 mg/dL). Liver Function Tests: Recent Labs  Lab 10/31/24 1316  AST 25  ALT 16  ALKPHOS 82  BILITOT 0.4  PROT 8.0  ALBUMIN  4.8   Recent Labs  Lab 10/31/24 1316  LIPASE 17   No results for input(s): AMMONIA in the last 168 hours. Coagulation Profile: No results for input(s): INR, PROTIME in the last 168 hours. Cardiac Enzymes: No results for input(s): CKTOTAL, CKMB, CKMBINDEX, TROPONINI in the last 168 hours. BNP (last 3 results) No results for input(s): PROBNP in the last 8760 hours. HbA1C: No results for input(s): HGBA1C in the last 72 hours. CBG: No results for input(s): GLUCAP in the last 168 hours. Lipid Profile: No results for input(s): CHOL, HDL, LDLCALC, TRIG, CHOLHDL, LDLDIRECT in the last 72 hours. Thyroid Function Tests: No results for input(s): TSH, T4TOTAL, FREET4, T3FREE, THYROIDAB in the last 72 hours. Anemia Panel: No results for input(s): VITAMINB12, FOLATE, FERRITIN, TIBC, IRON, RETICCTPCT in the last 72 hours. Sepsis Labs: No results for input(s): PROCALCITON, LATICACIDVEN in the last 168 hours.  Recent Results (from the past 240 hours)  C Difficile Quick Screen w PCR reflex     Status: None   Collection Time: 10/31/24  4:59 PM   Specimen: Stool  Result Value Ref Range Status   C Diff antigen NEGATIVE NEGATIVE Final   C Diff toxin NEGATIVE NEGATIVE Final   C Diff interpretation No C. difficile detected.  Final    Comment: Performed at [facility], [address] [place], [place] [address]         Radiology Studies: CT Angio Chest PE W/Cm &/Or Wo Cm Result Date: 10/31/2024 CLINICAL DATA:  Chest and abdominal pain, COVID positive EXAM: CT ANGIOGRAPHY CHEST CT ABDOMEN AND PELVIS WITH CONTRAST TECHNIQUE: Multidetector CT imaging of the chest was performed using the standard protocol during bolus administration of intravenous contrast. Multiplanar CT image reconstructions and MIPs were obtained to evaluate the vascular anatomy. Multidetector CT imaging of the abdomen and pelvis was performed using the standard protocol  during bolus administration of intravenous contrast. RADIATION DOSE REDUCTION: This exam was performed according to the departmental dose-optimization program which includes automated exposure control, adjustment of the mA and/or kV according to patient size and/or use of iterative reconstruction technique. CONTRAST:  OMNIPAQUE  IOHEXOL  350 MG/ML SOLN COMPARISON:  10/31/2024, 10/01/2024, 07/06/2024 FINDINGS: CTA CHEST FINDINGS Cardiovascular: This is a technically adequate evaluation of the pulmonary vasculature. No filling defects or pulmonary emboli. The heart is unremarkable without pericardial effusion. No evidence of thoracic aortic aneurysm or dissection. Mediastinum/Nodes: No enlarged mediastinal, hilar, or axillary lymph nodes. Thyroid gland, trachea, and esophagus demonstrate no significant findings. Lungs/Pleura: No acute airspace disease, effusion, or pneumothorax. Central airways are patent. Musculoskeletal: No acute or destructive bony abnormalities. Reconstructed images demonstrate no additional findings. Review of the MIP images confirms the above findings. 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 No other focal liver abnormality. Cholelithiasis without cholecystitis. No biliary duct dilation or choledocholithiasis. Pancreas: Unremarkable. No pancreatic ductal dilatation or surrounding inflammatory changes. Spleen: Normal  in size without focal abnormality. Adrenals/Urinary Tract: Kidneys enhance normally and symmetrically. No urinary tract calculi or obstructive uropathy. The adrenals are unremarkable. Bladder is decompressed, limiting its evaluation. Stomach/Bowel: No bowel obstruction or ileus. Gas fluid levels are seen throughout the colon, which may reflect diarrhea or recent laxative/enema use. Normal retrocecal appendix. No bowel wall thickening or inflammatory change. Vascular/Lymphatic: No significant vascular findings are present. No  enlarged abdominal or pelvic lymph nodes. Reproductive: Status post hysterectomy. No adnexal masses. Other: No free fluid or free intraperitoneal gas. No abdominal wall hernia. Musculoskeletal: No acute or destructive bony abnormalities. Reconstructed images demonstrate no additional findings. Review of the MIP images confirms the above findings. IMPRESSION: Chest: 1. No evidence of pulmonary embolus. 2. No acute intrathoracic process. Abdomen/pelvis: 1. Scattered gas fluid levels throughout the colon which may reflect diarrhea or recent laxative/enema use. 2. Cholelithiasis without cholecystitis. Electronically Signed   By: Ozell Daring M.D.   On: 10/31/2024 15:12   CT ABDOMEN PELVIS W CONTRAST Result Date: 10/31/2024 CLINICAL DATA:  Chest and abdominal pain, COVID positive EXAM: CT ANGIOGRAPHY CHEST CT ABDOMEN AND PELVIS WITH CONTRAST TECHNIQUE: Multidetector CT imaging of the chest was performed using the standard protocol during bolus administration of intravenous contrast. Multiplanar CT image reconstructions and MIPs were obtained to evaluate the vascular anatomy. Multidetector CT imaging of the abdomen and pelvis was performed using the standard protocol during bolus administration of intravenous contrast. RADIATION DOSE REDUCTION: This exam was performed according to the departmental dose-optimization program which includes automated exposure control, adjustment of the mA and/or kV according to patient size and/or use of iterative reconstruction technique. CONTRAST:  OMNIPAQUE  IOHEXOL  350 MG/ML SOLN COMPARISON:  10/31/2024, 10/01/2024, 07/06/2024 FINDINGS: CTA CHEST FINDINGS Cardiovascular: This is a technically adequate evaluation of the pulmonary vasculature. No filling defects or pulmonary emboli. The heart is unremarkable without pericardial effusion. No evidence of thoracic aortic aneurysm or dissection. Mediastinum/Nodes: No enlarged mediastinal, hilar, or axillary lymph nodes. Thyroid  gland, trachea, and esophagus demonstrate no significant findings. Lungs/Pleura: No acute airspace disease, effusion, or pneumothorax. Central airways are patent. Musculoskeletal: No acute or destructive bony abnormalities. Reconstructed images demonstrate no additional findings. Review of the MIP images confirms the above findings. 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 No other focal liver abnormality. Cholelithiasis without cholecystitis. No biliary duct dilation or choledocholithiasis. Pancreas: Unremarkable. No pancreatic ductal dilatation or surrounding inflammatory changes. Spleen: Normal in size without focal abnormality. Adrenals/Urinary Tract: Kidneys enhance normally and symmetrically. No urinary tract calculi or obstructive uropathy. The adrenals are unremarkable. Bladder is decompressed, limiting its evaluation. Stomach/Bowel: No bowel obstruction or ileus. Gas fluid levels are seen throughout the colon, which may reflect diarrhea or recent laxative/enema use. Normal retrocecal appendix. No bowel wall thickening or inflammatory change. Vascular/Lymphatic: No significant vascular findings are present. No enlarged abdominal or pelvic lymph nodes. Reproductive: Status post hysterectomy. No adnexal masses. Other: No free fluid or free intraperitoneal gas. No abdominal wall hernia. Musculoskeletal: No acute or destructive bony abnormalities. Reconstructed images demonstrate no additional findings. Review of the MIP images confirms the above findings. IMPRESSION: Chest: 1. No evidence of pulmonary embolus. 2. No acute intrathoracic process. Abdomen/pelvis: 1. Scattered gas fluid levels throughout the colon which may reflect diarrhea or recent laxative/enema use. 2. Cholelithiasis without cholecystitis. Electronically Signed   By: Ozell Daring M.D.   On: 10/31/2024 15:12   DG Chest Port 1 View Result Date: 10/31/2024 CLINICAL DATA:  Chest pain  and  vomiting. EXAM: PORTABLE CHEST 1 VIEW COMPARISON:  None Available. FINDINGS: Trachea is midline. Heart size normal. Lungs are clear. No pleural fluid. IMPRESSION: Negative. Electronically Signed   By: Newell Eke M.D.   On: 10/31/2024 13:51        Scheduled Meds:  enoxaparin  (LOVENOX ) injection  40 mg Subcutaneous QHS   metoCLOPramide  (REGLAN ) injection  10 mg Intravenous Q6H   sertraline   150 mg Oral Daily   thiamine  (VITAMIN B1) injection  100 mg Intravenous Daily   traZODone   100 mg Oral QHS   Continuous Infusions:  dextrose  5% lactated ringers  125 mL/hr at 11/01/24 0647   potassium chloride  10 mEq (11/01/24 0824)   sodium chloride             Derryl Duval, MD [facility] 11/01/2024, 9:58 AM   "

## 2024-11-01 NOTE — Plan of Care (Signed)

## 2024-11-02 LAB — CBC WITH DIFFERENTIAL/PLATELET
Abs Immature Granulocytes: 0.02 K/uL (ref 0.00–0.07)
Basophils Absolute: 0 K/uL (ref 0.0–0.1)
Basophils Relative: 0 %
Eosinophils Absolute: 0 K/uL (ref 0.0–0.5)
Eosinophils Relative: 1 %
HCT: 36.6 % (ref 36.0–46.0)
Hemoglobin: 12.1 g/dL (ref 12.0–15.0)
Immature Granulocytes: 0 %
Lymphocytes Relative: 48 %
Lymphs Abs: 2.4 K/uL (ref 0.7–4.0)
MCH: 28.9 pg (ref 26.0–34.0)
MCHC: 33.1 g/dL (ref 30.0–36.0)
MCV: 87.6 fL (ref 80.0–100.0)
Monocytes Absolute: 0.6 K/uL (ref 0.1–1.0)
Monocytes Relative: 12 %
Neutro Abs: 2 K/uL (ref 1.7–7.7)
Neutrophils Relative %: 39 %
Platelets: 182 K/uL (ref 150–400)
RBC: 4.18 MIL/uL (ref 3.87–5.11)
RDW: 14.6 % (ref 11.5–15.5)
WBC: 5 K/uL (ref 4.0–10.5)
nRBC: 0 % (ref 0.0–0.2)

## 2024-11-02 LAB — BASIC METABOLIC PANEL WITH GFR
Anion gap: 8 (ref 5–15)
BUN: <5 mg/dL — ABNORMAL LOW (ref 6–20)
CO2: 29 mmol/L (ref 22–32)
Calcium: 8.6 mg/dL — ABNORMAL LOW (ref 8.9–10.3)
Chloride: 105 mmol/L (ref 98–111)
Creatinine, Ser: 0.63 mg/dL (ref 0.44–1.00)
GFR, Estimated: >60 mL/min
Glucose, Bld: 81 mg/dL (ref 70–99)
Potassium: 3.5 mmol/L (ref 3.5–5.1)
Sodium: 142 mmol/L (ref 135–145)

## 2024-11-02 NOTE — Care Management Important Message (Signed)
 Important Message  Patient Details  Name: Julia Stanley MRN: [id number] Date of Birth: November 29, 1985   Important Message Given:  N/A - LOS <3 / Initial given by admissions     Duwaine LITTIE Ada 11/02/2024, 11:33 AM

## 2024-11-02 NOTE — Discharge Summary (Signed)
 Physician Discharge Summary  Julia Stanley FMW:[id number] DOB: 26-Apr-1986 DOA: 10/31/2024  PCP: Fernande Ophelia JINNY DOUGLAS, MD  Admit date: 10/31/2024 Discharge date: 11/02/2024  Admitted From: Home Disposition: Home  Recommendations for Outpatient Follow-up:  Follow up with PCP in next few days, follow-up on stool GI panel. Follow up in ED if symptoms worsen or new appear   Home Health: No Equipment/Devices: None  Discharge Condition: Stable CODE STATUS: Full Diet recommendation: Heart healthy  Brief/Interim Summary:   39 year old female with history of GERD, IBS, recent COVID infection tested about 4 days ago comes in with persistent diarrhea, nausea and vomiting.  She states she went to urgent care and received antiviral for COVID but could not put it down.  She continued to feel worse and has not been able to keep fluids down.  She has also been having persistent diarrhea.   In the ER she received Zofran , Reglan , Benadryl  and IV fluid boluses.  We were notable for dehydration, hypokalemia and metabolic acidosis. Patient has no fever or chills, has mild abdominal discomfort.  Workup overall negative with negative CT angiogram of chest.  CT abdomen and pelvis shows diarrheal state otherwise no other significant findings.      Patient admitted for further management.    Treated symptomatically.   Patient received multiple boluses of IV fluid and maintenance.   Symptoms gradually improved and she was able to discharge home.   She was able to tolerate diet with improvement in diarrhea prior to discharge.  C. difficile stool was negative.  Stool GI panel is pending at the time of discharge, need to follow-up with PCP     COVID-19: Continue droplet precautions, patient is not hypoxic, CT chest is negative.  No need for COVID directed therapies or steroids.   Hypotension: Likely secondary to hypovolemia.  Improved  GERD   IBS     Discharge Diagnoses:  Principal Problem:    Metabolic acidosis Active Problems:   GERD (gastroesophageal reflux disease)   Diarrhea   Intractable vomiting   COVID-19 virus infection    Discharge Instructions  Discharge Instructions     Discharge instructions   Complete by: As directed    1.  Continue medications that you have been taking prior to admission.  Follow-up with primary care provider within a week.   Increase activity slowly   Complete by: As directed       Allergies as of 11/02/2024       Reactions   Aspirin Other (See Comments)   Nose bleeds.   Sulfa Antibiotics Hives   Childhood allergy    Nasal Spray Other (See Comments)   Nose bleeds        Medication List     TAKE these medications    albuterol  108 (90 Base) MCG/ACT inhaler Commonly known as: VENTOLIN  HFA Inhale 1-2 puffs into the lungs every 6 (six) hours as needed for wheezing or shortness of breath.   dicyclomine  20 MG tablet Commonly known as: BENTYL  Take 1 tablet (20 mg total) by mouth 2 (two) times daily as needed for spasms.   diphenhydrAMINE  25 MG tablet Commonly known as: BENADRYL  Take 50 mg by mouth at bedtime as needed for allergies.   famotidine  20 MG tablet Commonly known as: Pepcid  Take 1 tablet (20 mg total) by mouth 2 (two) times daily as needed.   fexofenadine 180 MG tablet Commonly known as: ALLEGRA Take 180 mg by mouth daily. Reported on 05/09/2016   hydrocortisone  2.5 % rectal cream  Commonly known as: Proctozone -HC Place 1 Application rectally 2 (two) times daily.   ibuprofen  600 MG tablet Commonly known as: ADVIL  Take 1 tablet (600 mg total) by mouth every 6 (six) hours as needed.   ondansetron  4 MG disintegrating tablet Commonly known as: ZOFRAN -ODT Take 1 tablet (4 mg total) by mouth every 8 (eight) hours as needed for nausea or vomiting.   Qulipta  60 MG Tabs Generic drug: Atogepant  Take 1 tablet by mouth daily at 12 noon.   sertraline  100 MG tablet Commonly known as: ZOLOFT  Take 100 mg by mouth  daily.   traZODone  100 MG tablet Commonly known as: DESYREL  Take 100 mg by mouth at bedtime.   valACYclovir  500 MG tablet Commonly known as: VALTREX  Take 500 mg by mouth daily.        Allergies[1]  Consultations:    Procedures/Studies: CT Angio Chest PE W/Cm &/Or Wo Cm Result Date: 10/31/2024 CLINICAL DATA:  Chest and abdominal pain, COVID positive EXAM: CT ANGIOGRAPHY CHEST CT ABDOMEN AND PELVIS WITH CONTRAST TECHNIQUE: Multidetector CT imaging of the chest was performed using the standard protocol during bolus administration of intravenous contrast. Multiplanar CT image reconstructions and MIPs were obtained to evaluate the vascular anatomy. Multidetector CT imaging of the abdomen and pelvis was performed using the standard protocol during bolus administration of intravenous contrast. RADIATION DOSE REDUCTION: This exam was performed according to the departmental dose-optimization program which includes automated exposure control, adjustment of the mA and/or kV according to patient size and/or use of iterative reconstruction technique. CONTRAST:  OMNIPAQUE  IOHEXOL  350 MG/ML SOLN COMPARISON:  10/31/2024, 10/01/2024, 07/06/2024 FINDINGS: CTA CHEST FINDINGS Cardiovascular: This is a technically adequate evaluation of the pulmonary vasculature. No filling defects or pulmonary emboli. The heart is unremarkable without pericardial effusion. No evidence of thoracic aortic aneurysm or dissection. Mediastinum/Nodes: No enlarged mediastinal, hilar, or axillary lymph nodes. Thyroid gland, trachea, and esophagus demonstrate no significant findings. Lungs/Pleura: No acute airspace disease, effusion, or pneumothorax. Central airways are patent. Musculoskeletal: No acute or destructive bony abnormalities. Reconstructed images demonstrate no additional findings. Review of the MIP images confirms the above findings. CT ABDOMEN and PELVIS FINDINGS Hepatobiliary: Right lobe liver hemangiomas are again  noted. No other focal liver abnormality. Cholelithiasis without cholecystitis. No biliary duct dilation or choledocholithiasis. Pancreas: Unremarkable. No pancreatic ductal dilatation or surrounding inflammatory changes. Spleen: Normal in size without focal abnormality. Adrenals/Urinary Tract: Kidneys enhance normally and symmetrically. No urinary tract calculi or obstructive uropathy. The adrenals are unremarkable. Bladder is decompressed, limiting its evaluation. Stomach/Bowel: No bowel obstruction or ileus. Gas fluid levels are seen throughout the colon, which may reflect diarrhea or recent laxative/enema use. Normal retrocecal appendix. No bowel wall thickening or inflammatory change. Vascular/Lymphatic: No significant vascular findings are present. No enlarged abdominal or pelvic lymph nodes. Reproductive: Status post hysterectomy. No adnexal masses. Other: No free fluid or free intraperitoneal gas. No abdominal wall hernia. Musculoskeletal: No acute or destructive bony abnormalities. Reconstructed images demonstrate no additional findings. Review of the MIP images confirms the above findings. IMPRESSION: Chest: 1. No evidence of pulmonary embolus. 2. No acute intrathoracic process. Abdomen/pelvis: 1. Scattered gas fluid levels throughout the colon which may reflect diarrhea or recent laxative/enema use. 2. Cholelithiasis without cholecystitis. Electronically Signed   By: Ozell Daring M.D.   On: 10/31/2024 15:12   CT ABDOMEN PELVIS W CONTRAST Result Date: 10/31/2024 CLINICAL DATA:  Chest and abdominal pain, COVID positive EXAM: CT ANGIOGRAPHY CHEST CT ABDOMEN AND PELVIS WITH CONTRAST TECHNIQUE: Multidetector CT  imaging of the chest was performed using the standard protocol during bolus administration of intravenous contrast. Multiplanar CT image reconstructions and MIPs were obtained to evaluate the vascular anatomy. Multidetector CT imaging of the abdomen and pelvis was performed using the standard  protocol during bolus administration of intravenous contrast. RADIATION DOSE REDUCTION: This exam was performed according to the departmental dose-optimization program which includes automated exposure control, adjustment of the mA and/or kV according to patient size and/or use of iterative reconstruction technique. CONTRAST:  OMNIPAQUE  IOHEXOL  350 MG/ML SOLN COMPARISON:  10/31/2024, 10/01/2024, 07/06/2024 FINDINGS: CTA CHEST FINDINGS Cardiovascular: This is a technically adequate evaluation of the pulmonary vasculature. No filling defects or pulmonary emboli. The heart is unremarkable without pericardial effusion. No evidence of thoracic aortic aneurysm or dissection. Mediastinum/Nodes: No enlarged mediastinal, hilar, or axillary lymph nodes. Thyroid gland, trachea, and esophagus demonstrate no significant findings. Lungs/Pleura: No acute airspace disease, effusion, or pneumothorax. Central airways are patent. Musculoskeletal: No acute or destructive bony abnormalities. Reconstructed images demonstrate no additional findings. Review of the MIP images confirms the above findings. 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 No other focal liver abnormality. Cholelithiasis without cholecystitis. No biliary duct dilation or choledocholithiasis. Pancreas: Unremarkable. No pancreatic ductal dilatation or surrounding inflammatory changes. Spleen: Normal in size without focal abnormality. Adrenals/Urinary Tract: Kidneys enhance normally and symmetrically. No urinary tract calculi or obstructive uropathy. The adrenals are unremarkable. Bladder is decompressed, limiting its evaluation. Stomach/Bowel: No bowel obstruction or ileus. Gas fluid levels are seen throughout the colon, which may reflect diarrhea or recent laxative/enema use. Normal retrocecal appendix. No bowel wall thickening or inflammatory change. Vascular/Lymphatic: No significant vascular findings are  present. No enlarged abdominal or pelvic lymph nodes. Reproductive: Status post hysterectomy. No adnexal masses. Other: No free fluid or free intraperitoneal gas. No abdominal wall hernia. Musculoskeletal: No acute or destructive bony abnormalities. Reconstructed images demonstrate no additional findings. Review of the MIP images confirms the above findings. IMPRESSION: Chest: 1. No evidence of pulmonary embolus. 2. No acute intrathoracic process. Abdomen/pelvis: 1. Scattered gas fluid levels throughout the colon which may reflect diarrhea or recent laxative/enema use. 2. Cholelithiasis without cholecystitis. Electronically Signed   By: Ozell Daring M.D.   On: 10/31/2024 15:12   DG Chest Port 1 View Result Date: 10/31/2024 CLINICAL DATA:  Chest pain and vomiting. EXAM: PORTABLE CHEST 1 VIEW COMPARISON:  None Available. FINDINGS: Trachea is midline. Heart size normal. Lungs are clear. No pleural fluid. IMPRESSION: Negative. Electronically Signed   By: Newell Eke M.D.   On: 10/31/2024 13:51      Subjective:   Discharge Exam: Vitals:   11/01/24 2100 11/02/24 0408  BP: 101/73 (!) 90/54  Pulse: (!) 56 63  Resp: 18 18  Temp: 98.3 F (36.8 C) 97.9 F (36.6 C)  SpO2: 99% 99%    General: Pt is alert, awake, not in acute distress Cardiovascular: rate controlled, S1/S2 + Respiratory: bilateral decreased breath sounds at bases Abdominal: Soft, NT, ND, bowel sounds + Extremities: no edema, no cyanosis    The results of significant diagnostics from this hospitalization (including imaging, microbiology, ancillary and laboratory) are listed below for reference.     Microbiology: Recent Results (from the past 240 hours)  C Difficile Quick Screen w PCR reflex     Status: None   Collection Time: 10/31/24  4:59 PM   Specimen: Stool  Result Value Ref Range Status   C Diff antigen NEGATIVE NEGATIVE Final  C Diff toxin NEGATIVE NEGATIVE Final   C Diff interpretation No C. difficile  detected.  Final    Comment: Performed at [facility], [address], [place], [place] [address]     Labs: BNP (last 3 results) No results for input(s): BNP in the last 8760 hours. Basic Metabolic Panel: Recent Labs  Lab 10/31/24 1819 10/31/24 2201 11/01/24 0239 11/01/24 0609 11/02/24 0436  NA 138 138 139 140 142  K 3.3* 3.3* 3.2* 3.2* 3.5  CL 100 102 102 103 105  CO2 20* 21* 27 29 29   GLUCOSE 74 94 114* 105* 81  BUN 12 11 9 9  <5*  CREATININE 0.81 0.77 0.76 0.71 0.63  CALCIUM 8.3* 8.2* 8.2* 8.2* 8.6*   Liver Function Tests: Recent Labs  Lab 10/31/24 1316  AST 25  ALT 16  ALKPHOS 82  BILITOT 0.4  PROT 8.0  ALBUMIN 4.8   Recent Labs  Lab 10/31/24 1316  LIPASE 17   No results for input(s): AMMONIA in the last 168 hours. CBC: Recent Labs  Lab 10/31/24 1316 11/01/24 0239 11/02/24 0436  WBC 8.0 5.5 5.0  NEUTROABS 6.5  --  2.0  HGB 14.1 11.8* 12.1  HCT 42.3 35.0* 36.6  MCV 87.6 87.1 87.6  PLT 254 205 182   Cardiac Enzymes: No results for input(s): CKTOTAL, CKMB, CKMBINDEX, TROPONINI in the last 168 hours. BNP: Invalid input(s): POCBNP CBG: No results for input(s): GLUCAP in the last 168 hours. D-Dimer No results for input(s): DDIMER in the last 72 hours. Hgb A1c No results for input(s): HGBA1C in the last 72 hours. Lipid Profile No results for input(s): CHOL, HDL, LDLCALC, TRIG, CHOLHDL, LDLDIRECT in the last 72 hours. Thyroid function studies No results for input(s): TSH, T4TOTAL, T3FREE, THYROIDAB in the last 72 hours.  Invalid input(s): FREET3 Anemia work up No results for input(s): VITAMINB12, FOLATE, FERRITIN, TIBC, IRON, RETICCTPCT in the last 72 hours. Urinalysis    Component Value Date/Time   COLORURINE YELLOW 10/31/2024 1342   APPEARANCEUR CLEAR 10/31/2024 1342   APPEARANCEUR Cloudy (A) 08/15/2015 1530   LABSPEC 1.020 10/31/2024 1342   PHURINE 5.0 10/31/2024 1342   GLUCOSEU  NEGATIVE 10/31/2024 1342   HGBUR NEGATIVE 10/31/2024 1342   BILIRUBINUR NEGATIVE 10/31/2024 1342   BILIRUBINUR Negative 08/15/2015 1530   KETONESUR 80 (A) 10/31/2024 1342   PROTEINUR 30 (A) 10/31/2024 1342   NITRITE NEGATIVE 10/31/2024 1342   LEUKOCYTESUR NEGATIVE 10/31/2024 1342   Sepsis Labs Recent Labs  Lab 10/31/24 1316 11/01/24 0239 11/02/24 0436  WBC 8.0 5.5 5.0   Microbiology Recent Results (from the past 240 hours)  C Difficile Quick Screen w PCR reflex     Status: None   Collection Time: 10/31/24  4:59 PM   Specimen: Stool  Result Value Ref Range Status   C Diff antigen NEGATIVE NEGATIVE Final   C Diff toxin NEGATIVE NEGATIVE Final   C Diff interpretation No C. difficile detected.  Final    Comment: Performed at [facility], [address] [place], [place] [address]     Time coordinating discharge: 35 minutes  SIGNED:   Derryl Duval, MD  [facility] 11/02/2024, 3:24 PM      [1]  Allergies Allergen Reactions   Aspirin Other (See Comments)    Nose bleeds.   Sulfa Antibiotics Hives    Childhood allergy    Nasal Spray Other (See Comments)    Nose bleeds

## 2024-11-02 NOTE — Progress Notes (Signed)
 Patient admitted for Metabolic  Acidosis 2/2 persistent N/V/D.  Inpatient Care Manager (ICM) conducted chart review and contacted patient by phone to complete brief admission assessment.    Patient confirmed she lives in [name] with ramped entrance with her Brother, Julia Stanley, Mom, Step-Dad, and G-mother, is totally independent, has no DME or HH svcs, does not drive and is reliant on family for transportation, is Disabled and reliant on SSDI for income  No discharge needs identified at this time. However, IPCM team will continue to follow along and monitor patient advancement through interdisciplinary progression rounds. If new patient transition needs arise, please enter a ICM consult to prompt IPCM team to follow up.    11/02/24 1008  TOC Brief Assessment  Insurance and Status Reviewed  Patient has primary care physician Yes  Home environment has been reviewed Home with Multiple Family members  Prior level of function: totally independent  Prior/Current Home Services No current home services  Social Drivers of Health Review SDOH reviewed no interventions necessary  Readmission risk has been reviewed Yes  Transition of care needs no transition of care needs at this time

## 2024-11-03 LAB — GI PATHOGEN PANEL BY PCR, STOOL
